# Patient Record
Sex: Male | Born: 1950
Health system: Southern US, Community
[De-identification: ages and names within clinical notes are randomized; demographics above are authoritative.]

## PROBLEM LIST (undated history)

## (undated) DIAGNOSIS — M1990 Unspecified osteoarthritis, unspecified site: Secondary | ICD-10-CM

## (undated) DIAGNOSIS — T7840XA Allergy, unspecified, initial encounter: Secondary | ICD-10-CM

## (undated) HISTORY — DX: Unspecified osteoarthritis, unspecified site: M19.90

## (undated) HISTORY — DX: Allergy, unspecified, initial encounter: T78.40XA

---

## 2005-05-12 ENCOUNTER — Ambulatory Visit: Payer: Self-pay | Admitting: Internal Medicine

## 2005-09-26 ENCOUNTER — Ambulatory Visit: Payer: Self-pay | Admitting: Internal Medicine

## 2007-07-30 ENCOUNTER — Encounter: Payer: Self-pay | Admitting: Internal Medicine

## 2012-05-07 ENCOUNTER — Ambulatory Visit (INDEPENDENT_AMBULATORY_CARE_PROVIDER_SITE_OTHER): Payer: BC Managed Care – PPO | Admitting: Family Medicine

## 2012-05-07 ENCOUNTER — Encounter: Payer: Self-pay | Admitting: Family Medicine

## 2012-05-07 VITALS — BP 119/79 | HR 68 | Temp 98.7°F | Resp 17 | Ht 66.0 in | Wt 158.4 lb

## 2012-05-07 DIAGNOSIS — R350 Frequency of micturition: Secondary | ICD-10-CM

## 2012-05-07 DIAGNOSIS — R351 Nocturia: Secondary | ICD-10-CM

## 2012-05-07 LAB — POCT URINALYSIS DIPSTICK
Bilirubin, UA: NEGATIVE
Blood, UA: NEGATIVE
Glucose, UA: NEGATIVE
Ketones, UA: NEGATIVE
Nitrite, UA: NEGATIVE
Spec Grav, UA: >=1.03
pH, UA: 5.5

## 2012-05-07 NOTE — Patient Instructions (Signed)
Your should receive a call or letter about your lab results within the next week to 10 days.  If any recurrence of your symptoms - return for recheck.  schedule a physical in the next few months.  Return to the clinic or go to the nearest emergency room if any of your symptoms worsen or new symptoms occur.

## 2012-05-07 NOTE — Progress Notes (Signed)
  Subjective:    Patient ID: Rodney Bailey, male    DOB: 31-Jul-1950, 61 y.o.   MRN: [id number]  HPI 3-4 weeks ago - increase in urinary frequency and nocturia.  Cut back on tea and stopped caffeine altogether.  improved - feels back to normal now - past 4-5 days.  Last PSA  1.35 04/06/10.  Review of Systems  Constitutional: Negative for fever, chills and unexpected weight change.  Gastrointestinal:       No tenesmus.  Genitourinary: Negative for hematuria and testicular pain.       Initial odor and burning - none now.   Musculoskeletal: Negative for back pain.  Skin: Negative for rash.       Objective:   Physical Exam  Vitals reviewed. Constitutional: He is oriented to person, place, and time. He appears well-developed and well-nourished.  Pulmonary/Chest: Effort normal.  Abdominal: Soft. Bowel sounds are normal. There is no CVA tenderness.  Genitourinary: Prostate normal. Prostate is not tender.  Neurological: He is alert and oriented to person, place, and time.  Skin: Skin is warm and dry.  Psychiatric: He has a normal mood and affect.      Results for orders placed in visit on 05/07/12  POCT URINALYSIS DIPSTICK      Component Value Range   Color, UA yellow     Clarity, UA clear     Glucose, UA neg     Bilirubin, UA neg     Ketones, UA neg     Spec Grav, UA >=1.030     Blood, UA neg     pH, UA 5.5     Protein, UA neg     Urobilinogen, UA 0.2     Nitrite, UA neg     Leukocytes, UA Negative         Assessment & Plan:  Rodney Bailey is a 61 y.o. male 1. Urinary frequency  POCT urinalysis dipstick, PSA  2. Nocturia  PSA   Dysuria/frequency now resolved.  Ddx mild prostatitis/UTI or caffeine effect.  U/a reassuring and with sx's resolved, will hold on further workup for now.  Will check PSA and plan on cpe next 3 months. rtc if sx's recur sooner.   Patient Instructions  Your should receive a call or letter about your lab results within the next week to 10 days.    If any recurrence of your symptoms - return for recheck.  schedule a physical in the next few months.  Return to the clinic or go to the nearest emergency room if any of your symptoms worsen or new symptoms occur.

## 2012-09-11 ENCOUNTER — Ambulatory Visit (INDEPENDENT_AMBULATORY_CARE_PROVIDER_SITE_OTHER): Payer: BC Managed Care – PPO | Admitting: Physician Assistant

## 2012-09-11 VITALS — BP 138/72 | HR 92 | Temp 100.9°F | Resp 16 | Ht 66.0 in | Wt 164.0 lb

## 2012-09-11 DIAGNOSIS — R509 Fever, unspecified: Secondary | ICD-10-CM

## 2012-09-11 DIAGNOSIS — R059 Cough, unspecified: Secondary | ICD-10-CM

## 2012-09-11 DIAGNOSIS — R05 Cough: Secondary | ICD-10-CM

## 2012-09-11 DIAGNOSIS — J019 Acute sinusitis, unspecified: Secondary | ICD-10-CM

## 2012-09-11 LAB — POCT CBC
Granulocyte percent: 71.7 %G (ref 37–80)
HCT, POC: 49.7 % (ref 43.5–53.7)
Lymph, poc: 0.9 (ref 0.6–3.4)
MCH, POC: 30.7 pg (ref 27–31.2)
MCV: 95.9 fL (ref 80–97)
POC LYMPH PERCENT: 19.4 %L (ref 10–50)
RDW, POC: 13.2 %
WBC: 4.4 10*3/uL — AB (ref 4.6–10.2)

## 2012-09-11 LAB — POCT INFLUENZA A/B
Influenza A, POC: NEGATIVE
Influenza B, POC: NEGATIVE

## 2012-09-11 MED ORDER — AZELASTINE HCL 0.1 % NA SOLN: 1.0000 | Freq: Two times a day (BID) | NASAL | Status: DC

## 2012-09-11 MED ORDER — MOMETASONE FUROATE 50 MCG/ACT NA SUSP: 2.0000 | Freq: Every day | NASAL | Status: DC

## 2012-09-11 MED ORDER — LEVOFLOXACIN 500 MG PO TABS: 500.0000 mg | ORAL_TABLET | Freq: Every day | ORAL | Status: DC

## 2012-09-11 MED ORDER — HYDROCODONE-HOMATROPINE 5-1.5 MG/5ML PO SYRP: ORAL_SOLUTION | ORAL | Status: DC

## 2012-09-11 NOTE — Progress Notes (Signed)
Patient ID: Rodney Bailey MRN: [id number], DOB: 18-May-1951, 62 y.o. Date of Encounter: 09/11/2012, 2:13 PM  Primary Physician: No primary provider on file.  Chief Complaint:  Chief Complaint  Patient presents with  . URI    3 days  . Fever    HPI: 62 y.o. male presents with a three day history of nasal congestion, post nasal drip, sore throat, sinus pressure, and cough. T max 101 earlier today. Nasal congestion thick and green/yellow. Sinus pressure is the worst symptom. Cough is not productive and seems to be worse at nighttime when he lays down. Ears feel slightly full, leading to a "dull" sensation. Has tried Mucinex without success. No GI complaints. Appetite normal. No recent antibiotics, recent travels, or sick contacts. No leg trauma, sedentary periods, h/o cancer, or tobacco use. No influenza vaccine.   Past Medical History  Diagnosis Date  . Allergy   . Arthritis      Home Meds: Prior to Admission medications   Medication Sig Start Date End Date Taking? Authorizing Provider  CALCIUM PO Take by mouth daily.   Yes Historical Provider, MD  GARLIC PO Take by mouth daily.   Yes Historical Provider, MD  GLUCOSAMINE PO Take by mouth daily.   Yes Historical Provider, MD  Melatonin 2.5 MG CAPS Take by mouth at bedtime.   Yes Historical Provider, MD  Multiple Vitamin (MULTIVITAMIN) tablet Take 1 tablet by mouth daily.   Yes Historical Provider, MD  Multiple Vitamins-Minerals (ZINC PO) Take by mouth daily.   Yes Historical Provider, MD  naproxen (NAPROSYN) 250 MG tablet Take 250 mg by mouth as needed.   Yes Historical Provider, MD  Omega-3 Fatty Acids (OMEGA 3 PO) Take by mouth daily.   Yes Historical Provider, MD  saw palmetto 160 MG capsule Take 160 mg by mouth 2 (two) times daily.   Yes Historical Provider, MD  TURMERIC PO Take by mouth daily.   Yes Historical Provider, MD  mometasone (NASONEX) 50 MCG/ACT nasal spray Place 2 sprays into the nose daily as needed.    Historical  Provider, MD    Allergies: No Known Allergies  History   Social History  . Marital Status: Married    Spouse Name: [name]    Number of Children: [name]  . Years of Education: [name]   Occupational History  . Not on file.   Social History Main Topics  . Smoking status: Never Smoker   . Smokeless tobacco: Not on file  . Alcohol Use: No  . Drug Use: No  . Sexually Active: Yes    Birth Control/ Protection: None     Comment: SEX PARTNERS IN THE LAST 12 MONTHS 1 AND CURRENT BIRTH CONTROL - NOT NEEDED   Other Topics Concern  . Not on file   Social History Narrative   EXERCISE WALKING AND WEIGHT TRAINING 5-6 DAYS/WEEK FOR 30 MINUTES     Review of Systems: Constitutional: Positive for fever, chills, and fatigue. Negative for night sweats or weight changes HEENT: see above Cardiovascular: negative for chest pain or palpitations Respiratory: Positive for cough. Negative for hemoptysis, wheezing, or shortness of breath Abdominal: negative for abdominal pain, nausea, vomiting or diarrhea Dermatological: negative for rash Neurologic: Positive for headache. Negative for dizziness or vertigo   Physical Exam: Blood pressure 138/72, pulse 92, temperature 100.9 F (38.3 C), temperature source Oral, resp. rate 16, height 5\' 6"  (1.676 m), weight 164 lb (74.39 kg), SpO2 97.00%., Body mass index is 26.48 kg/(m^2). General: Well  developed, well nourished, in no acute distress. Head: Normocephalic, atraumatic, eyes without discharge, sclera non-icteric, nares are congested. Bilateral auditory canals clear, TM's are without perforation, pearly grey with reflective cone of light bilaterally. Serous effusion bilaterally behind TM's. Maxillary sinus TTP. Oral cavity moist, dentition normal. Posterior pharynx with post nasal drip and mild erythema. No peritonsillar abscess or tonsillar exudate. Uvula midline.  Neck: Supple. No thyromegaly. Full ROM. No lymphadenopathy. Lungs: Clear bilaterally to  auscultation without wheezes, rales, or rhonchi. Breathing is unlabored.  Heart: RRR with S1 S2. No murmurs, rubs, or gallops appreciated. Msk:  Strength and tone normal for age. Extremities: No clubbing or cyanosis. No edema. Neuro: Alert and oriented X 3. Moves all extremities spontaneously. CNII-XII grossly in tact. Psych:  Responds to questions appropriately with a normal affect.   Labs: Results for orders placed in visit on 09/11/12  POCT INFLUENZA A/B      Result Value Range   Influenza A, POC Negative     Influenza B, POC Negative    POCT CBC      Result Value Range   WBC 4.4 (*) 4.6 - 10.2 K/uL   Lymph, poc 0.9  0.6 - 3.4   POC LYMPH PERCENT 19.4  10 - 50 %L   MID (cbc) 0.4  0 - 0.9   POC MID % 8.9  0 - 12 %M   POC Granulocyte 3.2  2 - 6.9   Granulocyte percent 71.7  37 - 80 %G   RBC 5.18  4.69 - 6.13 M/uL   Hemoglobin 15.9  14.1 - 18.1 g/dL   HCT, POC 16.1  09.6 - 53.7 %   MCV 95.9  80 - 97 fL   MCH, POC 30.7  27 - 31.2 pg   MCHC 32.0  31.8 - 35.4 g/dL   RDW, POC 04.5     Platelet Count, POC 243  142 - 424 K/uL   MPV 8.2  0 - 99.8 fL     ASSESSMENT AND PLAN:  62 y.o. male with sinusitis, cough, and fever -Levaquin 500 mg 1 po daily #10 no RF -Hycodan #4oz 1 tsp po q 4-6 hours prn cough no RF SED -Nasonex 2 sprays daily #1 RF 6 -Astelin 1 spray bid #1 RF 6 -Mucinex -Tylenol/Motrin prn -Rest/fluids -Negative influenza and outside of Tamiflu treatment window -RTC precautions -RTC 3-5 days if no improvement  Signed, Eula Listen, PA-C 09/11/2012 2:13 PM

## 2013-03-19 ENCOUNTER — Ambulatory Visit (INDEPENDENT_AMBULATORY_CARE_PROVIDER_SITE_OTHER): Payer: BC Managed Care – PPO | Admitting: Family Medicine

## 2013-03-19 VITALS — BP 122/70 | HR 86 | Temp 98.9°F | Resp 16 | Ht 66.0 in | Wt 165.2 lb

## 2013-03-19 DIAGNOSIS — Z23 Encounter for immunization: Secondary | ICD-10-CM

## 2013-03-19 DIAGNOSIS — S61209A Unspecified open wound of unspecified finger without damage to nail, initial encounter: Secondary | ICD-10-CM

## 2013-03-19 NOTE — Patient Instructions (Signed)
You had a Tdap today- your next tetanus shot will just need to be a Td.   If you have any problems please let me know

## 2013-03-19 NOTE — Progress Notes (Signed)
[facility] [address], [place] [address] [phone]  Date:  03/19/2013   Name:  Rodney Bailey   DOB:  [birth year]   MRN:  [id number]  PCP:  No primary provider on file.    Chief Complaint: Finger Laceration   History of Present Illness:  Rodney Bailey is a 62 y.o. very pleasant male patient who presents with the following:  Small wound to left index finger that occurred yesterday while installing an exhaust fan.  He thinks his last tetanus was in 2001- however he is not sure and we do not have documentation.  He is not sure if he had a Td or Tdap last.  otherwise he is well and does not have any concerns today.  He declines a flu shot Generally healthy   There are no active problems to display for this patient.   Past Medical History  Diagnosis Date  . Allergy   . Arthritis     History reviewed. No pertinent past surgical history.  History  Substance Use Topics  . Smoking status: Never Smoker   . Smokeless tobacco: Not on file  . Alcohol Use: No    Family History  Problem Relation Age of Onset  . Heart disease Mother     CHF  . Stroke Father     No Known Allergies  Medication list has been reviewed and updated.  Current Outpatient Prescriptions on File Prior to Visit  Medication Sig Dispense Refill  . azelastine (ASTELIN) 137 MCG/SPRAY nasal spray Place 1 spray into the nose 2 (two) times daily. Use in each nostril as directed  30 mL  6  . CALCIUM PO Take by mouth daily.      Marland Kitchen GARLIC PO Take by mouth daily.      Marland Kitchen GLUCOSAMINE PO Take by mouth daily.      . Melatonin 2.5 MG CAPS Take by mouth at bedtime.      . mometasone (NASONEX) 50 MCG/ACT nasal spray Place 2 sprays into the nose daily.  17 g  6  . Multiple Vitamin (MULTIVITAMIN) tablet Take 1 tablet by mouth daily.      . Multiple Vitamins-Minerals (ZINC PO) Take by mouth daily.      . naproxen (NAPROSYN) 250 MG tablet Take 250 mg by mouth as needed.      . Omega-3 Fatty  Acids (OMEGA 3 PO) Take by mouth daily.      . saw palmetto 160 MG capsule Take 160 mg by mouth 2 (two) times daily.      . TURMERIC PO Take by mouth daily.      Marland Kitchen HYDROcodone-homatropine (HYCODAN) 5-1.5 MG/5ML syrup 1 TSP PO Q 4-6 HOURS PRN COUGH  120 mL  0  . levofloxacin (LEVAQUIN) 500 MG tablet Take 1 tablet (500 mg total) by mouth daily.  10 tablet  0   No current facility-administered medications on file prior to visit.    Review of Systems:  As per HPI- otherwise negative.   Physical Examination: Filed Vitals:   03/19/13 1458  BP: 122/70  Pulse: 86  Temp: 98.9 F (37.2 C)  Resp: 16   Filed Vitals:   03/19/13 1458  Height: 5\' 6"  (1.676 m)  Weight: 165 lb 3.2 oz (74.934 kg)   Body mass index is 26.68 kg/(m^2). Ideal Body Weight: Weight in (lb) to have BMI = 25: 154.6  GEN: WDWN, NAD, Non-toxic, A & O x 3, looks well HEENT: Atraumatic,  Normocephalic. Neck supple. No masses, No LAD. Ears and Nose: No external deformity. CV: RRR, No M/G/R. No JVD. No thrill. No extra heart sounds. PULM: CTA B, no wheezes, crackles, rhonchi. No retractions. No resp. distress. No accessory muscle use. EXTR: No c/c/e NEURO Normal gait.  PSYCH: Normally interactive. Conversant. Not depressed or anxious appearing.  Calm demeanor.  Small, clean, shallow lac to the pad of the left index finger, about 1/2 cm in length. does not pull apart as it is quite shallow.     Assessment and Plan: Wound, open, finger, initial encounter - Plan: Tdap vaccine greater than or equal to 7yo IM  Small laceration.  Update tdap today.  Follow-up prn.    Signed Abbe Amsterdam, MD

## 2014-03-11 ENCOUNTER — Ambulatory Visit (INDEPENDENT_AMBULATORY_CARE_PROVIDER_SITE_OTHER): Payer: BC Managed Care – PPO | Admitting: Family Medicine

## 2014-03-11 VITALS — BP 110/72 | HR 72 | Temp 98.3°F | Resp 16 | Ht 67.0 in | Wt 171.0 lb

## 2014-03-11 DIAGNOSIS — R319 Hematuria, unspecified: Secondary | ICD-10-CM

## 2014-03-11 DIAGNOSIS — R300 Dysuria: Secondary | ICD-10-CM

## 2014-03-11 DIAGNOSIS — N411 Chronic prostatitis: Secondary | ICD-10-CM

## 2014-03-11 LAB — POCT UA - MICROSCOPIC ONLY
Bacteria, U Microscopic: NEGATIVE
Casts, Ur, LPF, POC: NEGATIVE
Crystals, Ur, HPF, POC: NEGATIVE
MUCUS UA: NEGATIVE
YEAST UA: NEGATIVE

## 2014-03-11 LAB — POCT URINALYSIS DIPSTICK
BILIRUBIN UA: NEGATIVE
GLUCOSE UA: NEGATIVE
Ketones, UA: NEGATIVE
LEUKOCYTES UA: NEGATIVE
NITRITE UA: NEGATIVE
Protein, UA: NEGATIVE
Spec Grav, UA: 1.01
UROBILINOGEN UA: 0.2
pH, UA: 5

## 2014-03-11 MED ORDER — CIPROFLOXACIN HCL 500 MG PO TABS: 500.0000 mg | ORAL_TABLET | Freq: Two times a day (BID) | ORAL | Status: DC

## 2014-03-11 NOTE — Progress Notes (Signed)
Subjective:    Patient ID: Rodney Bailey, male    DOB: 26-Jan-1951, 63 y.o.   MRN: [id number]  This chart was scribed for Wendie Agreste, MD at [facility] by Rayfield Citizen, medical scribe. This patient was seen in room Room 4 and the patient's care was started at 3:48 PM.   HPI  HPI Comments: Rodney Bailey is a 63 y.o. male who [facility] complaining of urinary symptoms.   Patient last seen by Dr. Carlota Raspberry in 2013. Noted urinary frequency at that time that had been occurring for 3-4 weeks but improved by that visit. PSA at that time was 2.14. Recommended recheck in 6 months for repeat PSA.   Patient currently believes he has a UTI or bladder infection. He explains his symptoms began 2-3 months ago, at which time he took cranberry tablets with minimal relief. He has burning, itching, and odorous urine. Patient reports that he drinks a large amount of fluids so he cannot comment on increased frequency. He wakes up 2-3x a night to urinate; this is normal for him for the past year or so, but has gotten better recently since he has cut back on liquid intake. He denies discharge or blood in the urine. He denies fevers, nausea, vomiting, back pain, or weight loss.   Patient reports no new sexual partners; he is married with no extramarital contacts.   There are no active problems to display for this patient.  Past Medical History  Diagnosis Date  . Allergy   . Arthritis    History reviewed. No pertinent past surgical history. No Known Allergies Prior to Admission medications   Medication Sig Start Date End Date Taking? Authorizing Provider  azelastine (ASTELIN) 137 MCG/SPRAY nasal spray Place 1 spray into the nose 2 (two) times daily. Use in each nostril as directed 09/11/12  Yes [name], PA-C  CALCIUM PO Take by mouth daily.   Yes Historical Provider, MD  GARLIC PO Take by mouth daily.   Yes Historical Provider, MD  GLUCOSAMINE PO  Take by mouth daily.   Yes Historical Provider, MD  Melatonin 2.5 MG CAPS Take by mouth at bedtime.   Yes Historical Provider, MD  mometasone (NASONEX) 50 MCG/ACT nasal spray Place 2 sprays into the nose daily. 09/11/12  Yes [name], PA-C  Multiple Vitamin (MULTIVITAMIN) tablet Take 1 tablet by mouth daily.   Yes Historical Provider, MD  Multiple Vitamins-Minerals (ZINC PO) Take by mouth daily.   Yes Historical Provider, MD  naproxen (NAPROSYN) 250 MG tablet Take 250 mg by mouth as needed.   Yes Historical Provider, MD  Omega-3 Fatty Acids (OMEGA 3 PO) Take by mouth daily.   Yes Historical Provider, MD  saw palmetto 160 MG capsule Take 160 mg by mouth 2 (two) times daily.   Yes Historical Provider, MD  TURMERIC PO Take by mouth daily.   Yes Historical Provider, MD   History   Social History  . Marital Status: Married    Spouse Name: [name]    Number of Children: [name]  . Years of Education: [name]   Occupational History  . Not on file.   Social History Main Topics  . Smoking status: Never Smoker   . Smokeless tobacco: Not on file  . Alcohol Use: No  . Drug Use: No  . Sexual Activity: Yes    Birth Control/ Protection: None     Comment: SEX PARTNERS  IN THE LAST 12 MONTHS 1 AND CURRENT BIRTH CONTROL - NOT NEEDED   Other Topics Concern  . Not on file   Social History Narrative   EXERCISE WALKING AND WEIGHT TRAINING 5-6 DAYS/WEEK FOR 30 MINUTES   Review of Systems  Constitutional: Negative for fever and unexpected weight change.  Gastrointestinal: Negative for nausea and vomiting.  Genitourinary: Positive for frequency. Negative for hematuria and discharge.      Objective:   Physical Exam  Nursing note and vitals reviewed. Constitutional: He is oriented to person, place, and time. He appears well-developed and well-nourished.  HENT:  Head: Normocephalic and atraumatic.  Neck: No tracheal deviation present.  Cardiovascular: Normal rate, regular rhythm and normal heart sounds.   Exam reveals no gallop and no friction rub.   No murmur heard. Pulmonary/Chest: Effort normal and breath sounds normal. He has no wheezes. He has no rales.  No rhonchi   Abdominal: Soft. There is no tenderness.  No CVA tenderness  Genitourinary:  Minimal discomfort with palpation of prostate but not painful; no apparent nodules or marked enlargement. Testicles were nontender. Penis nontender, no discharge, no external rash.   Neurological: He is alert and oriented to person, place, and time.  Skin: Skin is warm and dry.  Psychiatric: He has a normal mood and affect. His behavior is normal.    Filed Vitals:   03/11/14 1405  BP: 110/72  Pulse: 72  Temp: 98.3 F (36.8 C)  Resp: 16  Height: 5\' 7"  (1.702 m)  Weight: 171 lb (77.565 kg)  SpO2: 96%      Assessment & Plan:   Rodney Bailey is a 63 y.o. male Dysuria - Plan: POCT UA - Microscopic Only, POCT urinalysis dipstick, PSA, Urine culture, ciprofloxacin (CIPRO) 500 MG tablet  Chronic prostatitis with hematuria - Plan: ciprofloxacin (CIPRO) 500 MG tablet Suspected chronic prostatitis. Start with 10 days cipro, PSA and urine cx and possible urology eval. rtc precautions. SED and tendon risks of Cipro discussed.   Meds ordered this encounter  Medications  . ciprofloxacin (CIPRO) 500 MG tablet    Sig: Take 1 tablet (500 mg total) by mouth 2 (two) times daily.    Dispense:  20 tablet    Refill:  0   Patient Instructions  You should receive a call or letter about your lab results within the next week to 10 days.   We can start treatment for possible chronic prostate infection - may need to extend antibiotic longer than 10 days. But will wait on results of prostate tests first. Recheck in 10 days. Return to the clinic or go to the nearest emergency room if any of your symptoms worsen or new symptoms occur. Prostatitis The prostate gland is about the size and shape of a walnut. It is located just below your bladder. It produces one  of the components of semen, which is made up of sperm and the fluids that help nourish and transport it out from the testicles. Prostatitis is inflammation of the prostate gland.  There are four types of prostatitis:  Acute bacterial prostatitis. This is the least common type of prostatitis. It starts quickly and usually is associated with a bladder infection, high fever, and shaking chills. It can occur at any age.  Chronic bacterial prostatitis. This is a persistent bacterial infection in the prostate. It usually develops from repeated acute bacterial prostatitis or acute bacterial prostatitis that was not properly treated. It can occur in men of any age but  is most common in middle-aged men whose prostate has begun to enlarge. The symptoms are not as severe as those in acute bacterial prostatitis. Discomfort in the part of your body that is in front of your rectum and below your scrotum (perineum), lower abdomen, or in the head of your penis (glans) may represent your primary discomfort.  Chronic prostatitis (nonbacterial). This is the most common type of prostatitis. It is inflammation of the prostate gland that is not caused by a bacterial infection. The cause is unknown and may be associated with a viral infection or autoimmune disorder.  Prostatodynia (pelvic floor disorder). This is associated with increased muscular tone in the pelvis surrounding the prostate. CAUSES The causes of bacterial prostatitis are bacterial infection. The causes of the other types of prostatitis are unknown.  SYMPTOMS  Symptoms can vary depending upon the type of prostatitis that exists. There can also be overlap in symptoms. Possible symptoms for each type of prostatitis are listed below. Acute Bacterial Prostatitis  Painful urination.  Fever or chills.  Muscle or joint pains.  Low back pain.  Low abdominal pain.  Inability to empty bladder completely. Chronic Bacterial Prostatitis, Chronic  Nonbacterial Prostatitis, and Prostatodynia  Sudden urge to urinate.  Frequent urination.  Difficulty starting urine stream.  Weak urine stream.  Discharge from the urethra.  Dribbling after urination.  Rectal pain.  Pain in the testicles, penis, or tip of the penis.  Pain in the perineum.  Problems with sexual function.  Painful ejaculation.  Bloody semen. DIAGNOSIS  In order to diagnose prostatitis, your health care provider will ask about your symptoms. One or more urine samples will be taken and tested (urinalysis). If the urinalysis result is negative for bacteria, your health care provider may use a finger to feel your prostate (digital rectal exam). This exam helps your health care provider determine if your prostate is swollen and tender. It will also produce a specimen of semen that can be analyzed. TREATMENT  Treatment for prostatitis depends on the cause. If a bacterial infection is the cause, it can be treated with antibiotic medicine. In cases of chronic bacterial prostatitis, the use of antibiotics for up to 1 month or 6 weeks may be necessary. Your health care provider may instruct you to take sitz baths to help relieve pain. A sitz bath is a bath of hot water in which your hips and buttocks are under water. This relaxes the pelvic floor muscles and often helps to relieve the pressure on your prostate. HOME CARE INSTRUCTIONS   Take all medicines as directed by your health care provider.  Take sitz baths as directed by your health care provider. SEEK MEDICAL CARE IF:   Your symptoms get worse, not better.  You have a fever. SEEK IMMEDIATE MEDICAL CARE IF:   You have chills.  You feel nauseous or vomit.  You feel lightheaded or faint.  You are unable to urinate.  You have blood or blood clots in your urine. MAKE SURE YOU:  Understand these instructions.  Will watch your condition.  Will get help right away if you are not doing well or get  worse. Document Released: 05/20/2000 Document Revised: 05/28/2013 Document Reviewed: 12/10/2012 [facility] Patient Information 2015 [place], [place]. This information is not intended to replace advice given to you by your health care provider. Make sure you discuss any questions you have with your health care provider.     I personally performed the services described in this documentation, which was  scribed in my presence. The recorded information has been reviewed and considered, and addended by me as needed.

## 2014-03-11 NOTE — Patient Instructions (Addendum)
You should receive a call or letter about your lab results within the next week to 10 days.   We can start treatment for possible chronic prostate infection - may need to extend antibiotic longer than 10 days. But will wait on results of prostate tests first. Recheck in 10 days. Return to the clinic or go to the nearest emergency room if any of your symptoms worsen or new symptoms occur. Prostatitis The prostate gland is about the size and shape of a walnut. It is located just below your bladder. It produces one of the components of semen, which is made up of sperm and the fluids that help nourish and transport it out from the testicles. Prostatitis is inflammation of the prostate gland.  There are four types of prostatitis:  Acute bacterial prostatitis. This is the least common type of prostatitis. It starts quickly and usually is associated with a bladder infection, high fever, and shaking chills. It can occur at any age.  Chronic bacterial prostatitis. This is a persistent bacterial infection in the prostate. It usually develops from repeated acute bacterial prostatitis or acute bacterial prostatitis that was not properly treated. It can occur in men of any age but is most common in middle-aged men whose prostate has begun to enlarge. The symptoms are not as severe as those in acute bacterial prostatitis. Discomfort in the part of your body that is in front of your rectum and below your scrotum (perineum), lower abdomen, or in the head of your penis (glans) may represent your primary discomfort.  Chronic prostatitis (nonbacterial). This is the most common type of prostatitis. It is inflammation of the prostate gland that is not caused by a bacterial infection. The cause is unknown and may be associated with a viral infection or autoimmune disorder.  Prostatodynia (pelvic floor disorder). This is associated with increased muscular tone in the pelvis surrounding the prostate. CAUSES The causes of  bacterial prostatitis are bacterial infection. The causes of the other types of prostatitis are unknown.  SYMPTOMS  Symptoms can vary depending upon the type of prostatitis that exists. There can also be overlap in symptoms. Possible symptoms for each type of prostatitis are listed below. Acute Bacterial Prostatitis  Painful urination.  Fever or chills.  Muscle or joint pains.  Low back pain.  Low abdominal pain.  Inability to empty bladder completely. Chronic Bacterial Prostatitis, Chronic Nonbacterial Prostatitis, and Prostatodynia  Sudden urge to urinate.  Frequent urination.  Difficulty starting urine stream.  Weak urine stream.  Discharge from the urethra.  Dribbling after urination.  Rectal pain.  Pain in the testicles, penis, or tip of the penis.  Pain in the perineum.  Problems with sexual function.  Painful ejaculation.  Bloody semen. DIAGNOSIS  In order to diagnose prostatitis, your health care provider will ask about your symptoms. One or more urine samples will be taken and tested (urinalysis). If the urinalysis result is negative for bacteria, your health care provider may use a finger to feel your prostate (digital rectal exam). This exam helps your health care provider determine if your prostate is swollen and tender. It will also produce a specimen of semen that can be analyzed. TREATMENT  Treatment for prostatitis depends on the cause. If a bacterial infection is the cause, it can be treated with antibiotic medicine. In cases of chronic bacterial prostatitis, the use of antibiotics for up to 1 month or 6 weeks may be necessary. Your health care provider may instruct you to take sitz  baths to help relieve pain. A sitz bath is a bath of hot water in which your hips and buttocks are under water. This relaxes the pelvic floor muscles and often helps to relieve the pressure on your prostate. HOME CARE INSTRUCTIONS   Take all medicines as directed by your  health care provider.  Take sitz baths as directed by your health care provider. SEEK MEDICAL CARE IF:   Your symptoms get worse, not better.  You have a fever. SEEK IMMEDIATE MEDICAL CARE IF:   You have chills.  You feel nauseous or vomit.  You feel lightheaded or faint.  You are unable to urinate.  You have blood or blood clots in your urine. MAKE SURE YOU:  Understand these instructions.  Will watch your condition.  Will get help right away if you are not doing well or get worse. Document Released: 05/20/2000 Document Revised: 05/28/2013 Document Reviewed: 12/10/2012 [facility] Patient Information 2015 [place], [place]. This information is not intended to replace advice given to you by your health care provider. Make sure you discuss any questions you have with your health care provider.

## 2014-03-12 LAB — PSA: PSA: 1.47 ng/mL (ref <=4.00)

## 2014-03-12 LAB — URINE CULTURE
Colony Count: NO GROWTH
Organism ID, Bacteria: NO GROWTH

## 2014-03-17 ENCOUNTER — Telehealth: Payer: Self-pay

## 2014-03-17 NOTE — Telephone Encounter (Signed)
Pt would like to know the results of his resent PSA test Best# [phone]

## 2014-04-23 ENCOUNTER — Telehealth: Payer: Self-pay

## 2014-04-23 DIAGNOSIS — R350 Frequency of micturition: Secondary | ICD-10-CM

## 2014-04-23 DIAGNOSIS — R351 Nocturia: Secondary | ICD-10-CM

## 2014-04-23 NOTE — Telephone Encounter (Signed)
Pended referral for urologist. Please advise.

## 2014-04-23 NOTE — Telephone Encounter (Signed)
Patient is now requesting from Dr. Carlota Raspberry which urologist he would recommend patient would like a referral now for urology. Please advise. Thank you   Best: [phone]

## 2014-04-24 ENCOUNTER — Telehealth: Payer: Self-pay

## 2014-04-26 ENCOUNTER — Ambulatory Visit (INDEPENDENT_AMBULATORY_CARE_PROVIDER_SITE_OTHER): Payer: BC Managed Care – PPO | Admitting: Emergency Medicine

## 2014-04-26 VITALS — BP 122/78 | HR 90 | Temp 97.8°F | Resp 18 | Ht 66.0 in | Wt 164.0 lb

## 2014-04-26 DIAGNOSIS — R319 Hematuria, unspecified: Secondary | ICD-10-CM

## 2014-04-26 DIAGNOSIS — R300 Dysuria: Secondary | ICD-10-CM

## 2014-04-26 DIAGNOSIS — R350 Frequency of micturition: Secondary | ICD-10-CM

## 2014-04-26 LAB — POCT URINALYSIS DIPSTICK
Bilirubin, UA: NEGATIVE
Glucose, UA: NEGATIVE
KETONES UA: NEGATIVE
Leukocytes, UA: NEGATIVE
Nitrite, UA: NEGATIVE
PH UA: 5
PROTEIN UA: NEGATIVE
SPEC GRAV UA: 1.025
Urobilinogen, UA: 0.2

## 2014-04-26 LAB — POCT UA - MICROSCOPIC ONLY
Casts, Ur, LPF, POC: NEGATIVE
Crystals, Ur, HPF, POC: NEGATIVE
MUCUS UA: NEGATIVE
Yeast, UA: NEGATIVE

## 2014-04-26 MED ORDER — TAMSULOSIN HCL 0.4 MG PO CAPS: 0.4000 mg | ORAL_CAPSULE | Freq: Every day | ORAL | Status: DC

## 2014-04-26 NOTE — Progress Notes (Signed)
Subjective:    Patient ID: Rodney Bailey, male    DOB: May 23, 1951, 63 y.o.   MRN: [id number] This chart was scribed for [name], MD  by Cathie Hoops, ED Scribe. The patient was seen in Room 1. The patient's care was started at 8:28 AM.   04/26/2014  Chief Complaint  Patient presents with  . Follow-up    UTI    HPI HPI Comments: Rodney Bailey is a 63 y.o. male who presents to the [facility] here for UTI follow-up. Pt complains of associated recurrent dysuria, strong urine smell, pressure during urination, and waking 2-3x/night to urinate. Pt was seen by Dr. Merri Ray on 03/11/2014 for prostatitis. Pt's PSA was checked at that time and found to be within normal limits at 2.14. Pt was prescribed Cipro 500 mg bid for 10 days with minimal relief. He was referred to [facility] for unresolved symptoms but cannot be seen until January 1. He denies ever being on medication to assist with urination. He takes Astelin and Nasonex as needed but denies taking any chronic medications. Pt denies being a smoker. Pt denies blood during intercourse. Pt denies hematuria.   Review of Systems  Constitutional: Negative for fever and chills.  Gastrointestinal: Negative for nausea and vomiting.  Genitourinary: Positive for dysuria. Negative for hematuria.    Past Medical History  Diagnosis Date  . Allergy   . Arthritis    No past surgical history on file. No Known Allergies Current Outpatient Prescriptions  Medication Sig Dispense Refill  . azelastine (ASTELIN) 137 MCG/SPRAY nasal spray Place 1 spray into the nose 2 (two) times daily. Use in each nostril as directed 30 mL 6  . CALCIUM PO Take by mouth daily.    . ciprofloxacin (CIPRO) 500 MG tablet Take 1 tablet (500 mg total) by mouth 2 (two) times daily. 20 tablet 0  . GARLIC PO Take by mouth daily.    Marland Kitchen GLUCOSAMINE PO Take by mouth daily.    . Melatonin 2.5 MG CAPS Take by mouth at bedtime.    . mometasone  (NASONEX) 50 MCG/ACT nasal spray Place 2 sprays into the nose daily. 17 g 6  . Multiple Vitamin (MULTIVITAMIN) tablet Take 1 tablet by mouth daily.    . Multiple Vitamins-Minerals (ZINC PO) Take by mouth daily.    . naproxen (NAPROSYN) 250 MG tablet Take 250 mg by mouth as needed.    . Omega-3 Fatty Acids (OMEGA 3 PO) Take by mouth daily.    . saw palmetto 160 MG capsule Take 160 mg by mouth 2 (two) times daily.    . TURMERIC PO Take by mouth daily.     No current facility-administered medications for this visit.       Objective:  Triage Vitals: BP 154/78 mmHg  Pulse 90  Temp(Src) 97.8 F (36.6 C)  Resp 18  Ht 5\' 6"  (1.676 m)  Wt 164 lb (74.39 kg)  BMI 26.48 kg/m2  SpO2 97% Physical Exam  Constitutional: He is oriented to person, place, and time. He appears well-developed and well-nourished. No distress.  HENT:  Head: Normocephalic and atraumatic.  Eyes: Conjunctivae and EOM are normal.  Neck: Neck supple. No tracheal deviation present.  Cardiovascular: Normal rate.   Pulmonary/Chest: Effort normal. No respiratory distress.  Abdominal: Soft.  No flank tenderness.  Genitourinary:  Genital are normal without any discharge or lesions.  Musculoskeletal: Normal range of motion.  Neurological: He is alert and oriented to  person, place, and time.  Skin: Skin is warm and dry.  Psychiatric: He has a normal mood and affect. His behavior is normal.  Nursing note and vitals reviewed.  Results for orders placed or performed in visit on 04/26/14  POCT UA - Microscopic Only  Result Value Ref Range   WBC, Ur, HPF, POC 0-2    RBC, urine, microscopic 2-3    Bacteria, U Microscopic trace    Mucus, UA neg    Epithelial cells, urine per micros 0-2    Crystals, Ur, HPF, POC neg    Casts, Ur, LPF, POC neg    Yeast, UA neg   POCT urinalysis dipstick  Result Value Ref Range   Color, UA yellow    Clarity, UA clear    Glucose, UA neg    Bilirubin, UA neg    Ketones, UA neg    Spec  Grav, UA 1.025    Blood, UA trace    pH, UA 5.0    Protein, UA neg    Urobilinogen, UA 0.2    Nitrite, UA neg    Leukocytes, UA Negative     Assessment & Plan:  8:40 AM- Patient informed of current plan for treatment and evaluation and agrees with plan at this time. Will treat with Flomax and schedule stone protocol CT abdomen pelvis rule out stone.I personally performed the services described in this documentation, which was scribed in my presence. The recorded information has been reviewed and is accurate. Repeat blood pressure was normal.

## 2014-04-27 LAB — URINE CULTURE: Colony Count: 2000

## 2014-04-29 ENCOUNTER — Ambulatory Visit
Admission: RE | Admit: 2014-04-29 | Discharge: 2014-04-29 | Disposition: A | Discharge status: Home / Self Care | Payer: BC Managed Care – PPO | Source: Ambulatory Visit | Attending: Emergency Medicine | Admitting: Emergency Medicine

## 2014-04-29 LAB — GC/CHLAMYDIA PROBE AMP
CT Probe RNA: NEGATIVE
GC Probe RNA: NEGATIVE

## 2014-09-15 ENCOUNTER — Encounter: Payer: Self-pay | Admitting: Family Medicine

## 2015-05-27 ENCOUNTER — Ambulatory Visit (INDEPENDENT_AMBULATORY_CARE_PROVIDER_SITE_OTHER): Payer: BLUE CROSS/BLUE SHIELD | Admitting: Physician Assistant

## 2015-05-27 VITALS — BP 122/80 | HR 68 | Temp 98.3°F | Resp 18 | Ht 66.75 in | Wt 167.2 lb

## 2015-05-27 DIAGNOSIS — J0100 Acute maxillary sinusitis, unspecified: Secondary | ICD-10-CM

## 2015-05-27 DIAGNOSIS — L0201 Cutaneous abscess of face: Secondary | ICD-10-CM | POA: Diagnosis not present

## 2015-05-27 MED ORDER — DOXYCYCLINE HYCLATE 100 MG PO CAPS: 100.0000 mg | ORAL_CAPSULE | Freq: Two times a day (BID) | ORAL | Status: AC

## 2015-05-27 NOTE — Patient Instructions (Signed)
Take doxy twice a day for 7 days. Avoid excess sun exposure. Eat lots of yogurt. Return in 7 days if symptoms not improving. Stay away from nasal sprays for next couple days.

## 2015-05-27 NOTE — Progress Notes (Signed)
[facility] [address], [place] [address] [phone]  Date:  05/27/2015   Name:  Rodney Bailey   DOB:  07/06/1951   MRN:  [id number]  PCP:  No primary care provider on file.    Chief Complaint: Facial Pain and Sore   History of Present Illness:  This is a 64 y.o. male with PMH allergic rhinitis who is presenting with facial pain and sore right nostril x 2 days. Pt states over the past 2 weeks he has had more sneezing. He states his nostrils were burning. He was using astelin nasal spray as well as another nasal spray (unknown name) prescribed by his allergist.  Wilburn Mylar he developed a "cold sore" in his right nostril which has never happened before. The area is quite tender and the nostril is swollen. Today when he woke he had a tender area in front of his right ear. It is no longer tender but he states he had still tell it is swollen. He is having some right sided sinus tenderness. No significant nasal drainage. He denies fever, chills, cough, sore throat, sob/wheezing. No hx asthma. He is not a smoker.  Review of Systems:  Review of Systems See HPI  Patient Active Problem List   Diagnosis Date Noted  . Hematuria 04/26/2014    Prior to Admission medications   Medication Sig Start Date End Date Taking? Authorizing Provider  azelastine (ASTELIN) 137 MCG/SPRAY nasal spray Place 1 spray into the nose 2 (two) times daily. Use in each nostril as directed 09/11/12  Yes [name], PA-C  CALCIUM PO Take by mouth daily.   Yes Historical Provider, MD         GARLIC PO Take by mouth daily.   Yes Historical Provider, MD  GLUCOSAMINE PO Take by mouth daily.   Yes Historical Provider, MD  Melatonin 2.5 MG CAPS Take by mouth at bedtime.   Yes Historical Provider, MD  mometasone (NASONEX) 50 MCG/ACT nasal spray Place 2 sprays into the nose daily. 09/11/12  Yes [name], PA-C  Multiple Vitamin (MULTIVITAMIN) tablet Take 1 tablet by mouth daily.   Yes Historical  Provider, MD  Multiple Vitamins-Minerals (ZINC PO) Take by mouth daily.   Yes Historical Provider, MD  naproxen (NAPROSYN) 250 MG tablet Take 250 mg by mouth as needed.   Yes Historical Provider, MD  Omega-3 Fatty Acids (OMEGA 3 PO) Take by mouth daily.   Yes Historical Provider, MD  saw palmetto 160 MG capsule Take 160 mg by mouth 2 (two) times daily.   Yes Historical Provider, MD  tamsulosin (FLOMAX) 0.4 MG CAPS capsule Take 1 capsule (0.4 mg total) by mouth daily. 04/26/14  Yes [name], MD  TURMERIC PO Take by mouth daily.   Yes Historical Provider, MD    No Known Allergies  History reviewed. No pertinent past surgical history.  Social History  Substance Use Topics  . Smoking status: Never Smoker   . Smokeless tobacco: None  . Alcohol Use: No    Family History  Problem Relation Age of Onset  . Heart disease Mother     CHF  . Stroke Father     Medication list has been reviewed and updated.  Physical Examination:  Physical Exam  Constitutional: He is oriented to person, place, and time. He appears well-developed and well-nourished. No distress.  HENT:  Head: Normocephalic and atraumatic.  Right Ear: Hearing, external ear and ear canal normal. Tympanic membrane  is retracted.  Left Ear: Hearing, external ear and ear canal normal. Tympanic membrane is retracted.  Nose: Right sinus exhibits maxillary sinus tenderness. Right sinus exhibits no frontal sinus tenderness. Left sinus exhibits no maxillary sinus tenderness and no frontal sinus tenderness.  Mouth/Throat: Uvula is midline, oropharynx is clear and moist and mucous membranes are normal.  Palpable, mobile, non-tender right preauricular node No postauricular nodes.  No soft tissue swelling. No mastoid or auricle swelling/tenderness  1 cm area of swelling and erythema right nostril, septal side. Yellow pustule and crusting surrounding. Fluctuance present. TTP.  Eyes: Conjunctivae and lids are normal. Right eye  exhibits no discharge. Left eye exhibits no discharge. No scleral icterus.  Cardiovascular: Normal rate, regular rhythm, normal heart sounds and normal pulses.   No murmur heard. Pulmonary/Chest: Effort normal and breath sounds normal. No respiratory distress. He has no wheezes. He has no rhonchi. He has no rales.  Musculoskeletal: Normal range of motion.  Lymphadenopathy:       Head (right side): Preauricular adenopathy present. No submental, no submandibular, no tonsillar, no posterior auricular and no occipital adenopathy present.       Head (left side): No submental, no submandibular, no tonsillar, no preauricular, no posterior auricular and no occipital adenopathy present.    He has no cervical adenopathy.       Right: No supraclavicular adenopathy present.       Left: No supraclavicular adenopathy present.  Neurological: He is alert and oriented to person, place, and time.  Skin: Skin is warm, dry and intact. No lesion and no rash noted.  Psychiatric: He has a normal mood and affect. His speech is normal and behavior is normal. Thought content normal.   BP 122/80 mmHg  Pulse 68  Temp(Src) 98.3 F (36.8 C) (Oral)  Resp 18  Ht 5' 6.75" (1.695 m)  Wt 167 lb 3.2 oz (75.841 kg)  BMI 26.40 kg/m2  SpO2 97%  Procedure: Verbal consent obtained. Skin was cleaned with alcohol and anesthetized with 1 cc 1% lido without epi. A 0.25 cm incision was made. A small amount of purulent material expressed. Mostly blood expressed. Wound cavity size small. Wound not packed.. Wound dressed and wound care discussed.  Assessment and Plan:  1. Abscess of face 2. Acute maxillary sinusitis Abscess of right nostril with preauricular node swelling. I&D with very small amount purulence, mostly blood. Culture not obtained. Will place on doxy for sinusitis and for abscess. Advised he stay away from nasal sprays for a few days. Return in 1 week if symptoms not improving or at any time if symptoms  worsen/change. - doxycycline (VIBRAMYCIN) 100 MG capsule; Take 1 capsule (100 mg total) by mouth 2 (two) times daily. AVOID EXCESS SUN EXPOSURE WHILE ON THIS MEDICATION  Dispense: 14 capsule; Refill: 0  Benjaman Pott. Drenda Freeze, MHS [facility]  05/27/2015

## 2015-11-03 ENCOUNTER — Ambulatory Visit (INDEPENDENT_AMBULATORY_CARE_PROVIDER_SITE_OTHER): Payer: BLUE CROSS/BLUE SHIELD | Admitting: Emergency Medicine

## 2015-11-03 VITALS — BP 140/80 | HR 65 | Temp 98.0°F | Resp 18 | Ht 66.75 in | Wt 166.0 lb

## 2015-11-03 DIAGNOSIS — G4700 Insomnia, unspecified: Secondary | ICD-10-CM

## 2015-11-03 DIAGNOSIS — R351 Nocturia: Secondary | ICD-10-CM | POA: Diagnosis not present

## 2015-11-03 DIAGNOSIS — R350 Frequency of micturition: Secondary | ICD-10-CM

## 2015-11-03 LAB — POCT URINALYSIS DIP (MANUAL ENTRY)
BILIRUBIN UA: NEGATIVE
BILIRUBIN UA: NEGATIVE
GLUCOSE UA: NEGATIVE
LEUKOCYTES UA: NEGATIVE
Nitrite, UA: NEGATIVE
PROTEIN UA: NEGATIVE
Spec Grav, UA: <=1.005
Urobilinogen, UA: 0.2
pH, UA: 5

## 2015-11-03 LAB — POCT CBC
GRANULOCYTE PERCENT: 58.3 % (ref 37–80)
HEMATOCRIT: 45.5 % (ref 43.5–53.7)
Hemoglobin: 16.7 g/dL (ref 14.1–18.1)
LYMPH, POC: 1.7 (ref 0.6–3.4)
MCH, POC: 32.6 pg — AB (ref 27–31.2)
MCHC: 36.7 g/dL — AB (ref 31.8–35.4)
MCV: 89 fL (ref 80–97)
MID (CBC): 0.7 (ref 0–0.9)
MPV: 7.5 fL (ref 0–99.8)
POC GRANULOCYTE: 3.3 (ref 2–6.9)
POC LYMPH %: 29.9 % (ref 10–50)
POC MID %: 11.8 % (ref 0–12)
Platelet Count, POC: 204 10*3/uL (ref 142–424)
RBC: 5.11 M/uL (ref 4.69–6.13)
RDW, POC: 13.3 %
WBC: 5.6 10*3/uL (ref 4.6–10.2)

## 2015-11-03 LAB — POC MICROSCOPIC URINALYSIS (UMFC): MUCUS RE: ABSENT

## 2015-11-03 MED ORDER — TAMSULOSIN HCL 0.4 MG PO CAPS: 0.4000 mg | ORAL_CAPSULE | Freq: Every day | ORAL | Status: DC

## 2015-11-03 NOTE — Progress Notes (Addendum)
By signing my name below, I, [name], attest that this documentation has been prepared under the direction and in the presence of [name], MD.  Electronically Signed: [name], ED Scribe. 11/03/2015. 8:14 AM.  Chief Complaint:  Chief Complaint  Patient presents with  . Medication Refill    Tamsulosin    HPI: Rodney Bailey is a 65 y.o. male who reports to [facility] today for a medication refill of Flomax. Pt last seen me 04/2014 with dysuria and urinary frequency. At that time, pt was treated with flomax, which he states worked well for him. Pt has been waking up a couple times a night to urinate. He reports difficulty falling back to sleep after waking up the first time urinate but states flomax helps with this as well. Last PSA 1.47 03/2014.  Pt is UTD on colonoscopy.  Past Medical History  Diagnosis Date  . Allergy   . Arthritis    History reviewed. No pertinent past surgical history. Social History   Social History  . Marital Status: Married    Spouse Name: [name]  . Number of Children: [name]  . Years of Education: [name]   Social History Main Topics  . Smoking status: Never Smoker   . Smokeless tobacco: None  . Alcohol Use: No  . Drug Use: No  . Sexual Activity: Yes    Birth Control/ Protection: None     Comment: SEX PARTNERS IN THE LAST 12 MONTHS 1 AND CURRENT BIRTH CONTROL - NOT NEEDED   Other Topics Concern  . None   Social History Narrative   EXERCISE WALKING AND WEIGHT TRAINING 5-6 DAYS/WEEK FOR 30 MINUTES   Family History  Problem Relation Age of Onset  . Heart disease Mother     CHF  . Stroke Father    No Known Allergies Prior to Admission medications   Medication Sig Start Date End Date Taking? Authorizing Provider  azelastine (ASTELIN) 137 MCG/SPRAY nasal spray Place 1 spray into the nose 2 (two) times daily. Use in each nostril as directed 09/11/12  Yes [name], PA-C  CALCIUM PO Take by mouth daily.   Yes Historical Provider, MD  GARLIC PO Take  by mouth daily.   Yes Historical Provider, MD  GLUCOSAMINE PO Take by mouth daily.   Yes Historical Provider, MD  Melatonin 2.5 MG CAPS Take by mouth at bedtime.   Yes Historical Provider, MD  mometasone (NASONEX) 50 MCG/ACT nasal spray Place 2 sprays into the nose daily. 09/11/12  Yes [name], PA-C  Multiple Vitamin (MULTIVITAMIN) tablet Take 1 tablet by mouth daily.   Yes Historical Provider, MD  Multiple Vitamins-Minerals (ZINC PO) Take by mouth daily.   Yes Historical Provider, MD  naproxen (NAPROSYN) 250 MG tablet Take 250 mg by mouth as needed.   Yes Historical Provider, MD  Omega-3 Fatty Acids (OMEGA 3 PO) Take by mouth daily.   Yes Historical Provider, MD  saw palmetto 160 MG capsule Take 160 mg by mouth 2 (two) times daily.   Yes Historical Provider, MD  tamsulosin (FLOMAX) 0.4 MG CAPS capsule Take 1 capsule (0.4 mg total) by mouth daily. 04/26/14  Yes [name], MD  TURMERIC PO Take by mouth daily.   Yes Historical Provider, MD     ROS: The patient denies fevers, chills, night sweats, unintentional weight loss, chest pain, palpitations, wheezing, dyspnea on exertion, nausea, vomiting, abdominal pain, dysuria, hematuria, melena, numbness, weakness, or tingling.  All other systems  have been reviewed and were otherwise negative with the exception of those mentioned in the HPI and as above.    PHYSICAL EXAM: Filed Vitals:   11/03/15 0811  BP: 140/80  Pulse: 65  Temp: 98 F (36.7 C)  Resp: 18   Body mass index is 26.21 kg/(m^2).   General: Alert, no acute distress HEENT:  Normocephalic, atraumatic, oropharynx patent. Eye: [facility] Cardiovascular:  Regular rate and rhythm, no rubs murmurs or gallops.  No Carotid bruits, radial pulse intact. No pedal edema.  Respiratory: Clear to auscultation bilaterally.  No wheezes, rales, or rhonchi.  No cyanosis, no use of accessory musculature Abdominal: No organomegaly, abdomen is soft and non-tender, positive bowel sounds.  No  masses. Musculoskeletal: Gait intact. No edema, tenderness Skin: No rashes. Neurologic: Facial musculature symmetric. Psychiatric: Patient acts appropriately throughout our interaction. Lymphatic: No cervical or submandibular lymphadenopathy GU: prostate small, symmetrical without nodules.   LABS: Results for orders placed or performed in visit on 11/03/15  POCT CBC  Result Value Ref Range   WBC 5.6 4.6 - 10.2 K/uL   Lymph, poc 1.7 0.6 - 3.4   POC LYMPH PERCENT 29.9 10 - 50 %L   MID (cbc) 0.7 0 - 0.9   POC MID % 11.8 0 - 12 %M   POC Granulocyte 3.3 2 - 6.9   Granulocyte percent 58.3 37 - 80 %G   RBC 5.11 4.69 - 6.13 M/uL   Hemoglobin 16.7 14.1 - 18.1 g/dL   HCT, POC 45.5 43.5 - 53.7 %   MCV 89.0 80 - 97 fL   MCH, POC 32.6 (A) 27 - 31.2 pg   MCHC 36.7 (A) 31.8 - 35.4 g/dL   RDW, POC 13.3 %   Platelet Count, POC 204 142 - 424 K/uL   MPV 7.5 0 - 99.8 fL  POCT urinalysis dipstick  Result Value Ref Range   Color, UA yellow yellow   Clarity, UA clear clear   Glucose, UA negative negative   Bilirubin, UA negative negative   Ketones, POC UA negative negative   Spec Grav, UA <=1.005    Blood, UA small (A) negative   pH, UA 5.0    Protein Ur, POC negative negative   Urobilinogen, UA 0.2    Nitrite, UA Negative Negative   Leukocytes, UA Negative Negative  POCT Microscopic Urinalysis ([facility])  Result Value Ref Range   WBC,UR,HPF,POC None None WBC/hpf   RBC,UR,HPF,POC None None RBC/hpf   Bacteria Few (A) None, Too numerous to count   Mucus Absent Absent   Epithelial Cells, UR Per Microscopy None None, Too numerous to count cells/hpf    ASSESSMENT/PLAN: Urine had small blood on dipstick but nothing under the microscope. PSA was done. Flomax was refilled.I personally performed the services described in this documentation, which was scribed in my presence. The recorded information has been reviewed and is accurate.Of note patient had a CT abdomen and pelvis in November 2015 with  normal kidneys and collecting system and mild prosthetic enlargement.   Gross sideeffects, risk and benefits, and alternatives of medications d/w patient. Patient is aware that all medications have potential sideeffects and we are unable to predict every sideeffect or drug-drug interaction that may occur.  [name] MD 11/03/2015 8:13 AM

## 2015-11-03 NOTE — Patient Instructions (Addendum)
Please consider having a general physical exam in the near future. I have refilled her Flomax. I will call you with your PSA results.    IF you received an x-ray today, you will receive an invoice from [facility]. Please contact [facility] at [phone] with questions or concerns regarding your invoice.   IF you received labwork today, you will receive an invoice from [employer]. Please contact [facility] at [phone] with questions or concerns regarding your invoice.   Our billing staff will not be able to assist you with questions regarding bills from these companies.  You will be contacted with the lab results as soon as they are available. The fastest way to get your results is to activate your My Chart account. Instructions are located on the last page of this paperwork. If you have not heard from [place] regarding the results in 2 weeks, please contact this office.

## 2015-11-04 LAB — PSA: PSA: 1.6 ng/mL (ref <=4.00)

## 2016-11-01 ENCOUNTER — Encounter: Payer: Self-pay | Admitting: Physician Assistant

## 2016-11-01 ENCOUNTER — Ambulatory Visit (INDEPENDENT_AMBULATORY_CARE_PROVIDER_SITE_OTHER): Payer: Medicare HMO | Admitting: Physician Assistant

## 2016-11-01 VITALS — BP 128/82 | HR 72 | Temp 98.4°F | Resp 16 | Ht 66.0 in | Wt 167.2 lb

## 2016-11-01 DIAGNOSIS — G4700 Insomnia, unspecified: Secondary | ICD-10-CM

## 2016-11-01 MED ORDER — TRAZODONE HCL 50 MG PO TABS: 25.0000 mg | ORAL_TABLET | Freq: Every evening | ORAL | 1 refills | Status: DC | PRN

## 2016-11-01 NOTE — Patient Instructions (Signed)
     IF you received an x-ray today, you will receive an invoice from [facility]. Please contact  Radiology at [phone] with questions or concerns regarding your invoice.   IF you received labwork today, you will receive an invoice from [facility]. Please contact [facility] at [phone] with questions or concerns regarding your invoice.   Our billing staff will not be able to assist you with questions regarding bills from these companies.  You will be contacted with the lab results as soon as they are available. The fastest way to get your results is to activate your My Chart account. Instructions are located on the last page of this paperwork. If you have not heard from us regarding the results in 2 weeks, please contact this office.     

## 2016-11-01 NOTE — Progress Notes (Signed)
Patient ID: Rodney Bailey, male    DOB: 1950-08-17, 66 y.o.   MRN: [id number]  PCP: Wendie Agreste, MD  Chief Complaint  Patient presents with  . Trouble Sleeping    x1-2 weeks    Subjective:   Presents for evaluation of difficulty sleeping x 1-2 months. He has seen commercials for Ambien and Lunesta and thinks he needs something like that.  Previously worked at night, and had trouble then as well. Has used supplements from the health food store, including melatonin. Wife has been ill recently, and he has needed OTC diphenhydramine in order to sleep.  His wife is experiencing digestive and hormone problems, and her health care providers are working to get things managed. She reportedly was on anafranil for years and is now experiencing panic attacks and feels like she's having a heart attack.  Initially, he falls asleep easily. Awakens during the night to urinate, and then unable to go back to sleep for several hours. "Just laying there, restless, thinking." No increased urinary symptoms with regular use of antihistamines.  No palpitations, change in bowel habits, change in skin/hair/nails. No heat/cold intolerance.   Review of Systems No chest pain, SOB, HA, dizziness, vision change, N/V, diarrhea, constipation, dysuria, urinary urgency or frequency, myalgias, arthralgias or rash.     Patient Active Problem List   Diagnosis Date Noted  . Nocturia 11/03/2015  . Hematuria 04/26/2014     Prior to Admission medications   Medication Sig Start Date End Date Taking? Authorizing Provider  azelastine (ASTELIN) 137 MCG/SPRAY nasal spray Place 1 spray into the nose 2 (two) times daily. Use in each nostril as directed 09/11/12  Yes Dunn, Areta Haber, PA-C  CALCIUM PO Take by mouth daily.   Yes [provider]  GARLIC PO Take by mouth daily.   Yes [provider]  GLUCOSAMINE PO Take by mouth daily.   Yes [provider]  Melatonin 2.5 MG CAPS Take  by mouth at bedtime.   Yes [provider]  mometasone (NASONEX) 50 MCG/ACT nasal spray Place 2 sprays into the nose daily. 09/11/12  Yes Dunn, Areta Haber, PA-C  Multiple Vitamin (MULTIVITAMIN) tablet Take 1 tablet by mouth daily.   Yes [provider]  Multiple Vitamins-Minerals (ZINC PO) Take by mouth daily.   Yes [provider]  naproxen (NAPROSYN) 250 MG tablet Take 250 mg by mouth as needed.   Yes [provider]  Omega-3 Fatty Acids (OMEGA 3 PO) Take by mouth daily.   Yes [provider]  saw palmetto 160 MG capsule Take 160 mg by mouth 2 (two) times daily.   Yes [provider]  TURMERIC PO Take by mouth daily.   Yes [provider]  tamsulosin (FLOMAX) 0.4 MG CAPS capsule Take 1 capsule (0.4 mg total) by mouth daily. Patient not taking: Reported on 11/01/2016 11/03/15   [name], MD     No Known Allergies     Objective:  Physical Exam  Constitutional: He is oriented to person, place, and time. He appears well-developed and well-nourished. He is active and cooperative. No distress.  BP 128/82 (BP Location: Left Arm, Cuff Size: Normal)   Pulse 72   Temp 98.4 F (36.9 C) (Oral)   Resp 16   Ht 5\' 6"  (1.676 m)   Wt 167 lb 3.2 oz (75.8 kg)   SpO2 95%   BMI 26.99 kg/m   HENT:  Head: Normocephalic and atraumatic.  Right  Ear: Hearing normal.  Left Ear: Hearing normal.  Eyes: Conjunctivae are normal. No scleral icterus.  Neck: Normal range of motion. Neck supple. No thyromegaly present.  Cardiovascular: Normal rate, regular rhythm and normal heart sounds.   Pulses:      Radial pulses are 2+ on the right side, and 2+ on the left side.  Pulmonary/Chest: Effort normal and breath sounds normal.  Lymphadenopathy:       Head (right side): No tonsillar, no preauricular, no posterior auricular and no occipital adenopathy present.       Head (left side): No tonsillar, no preauricular, no posterior auricular and no occipital  adenopathy present.    He has no cervical adenopathy.       Right: No supraclavicular adenopathy present.       Left: No supraclavicular adenopathy present.  Neurological: He is alert and oriented to person, place, and time. No sensory deficit.  Skin: Skin is warm, dry and intact. No rash noted. No cyanosis or erythema. Nails show no clubbing.  Psychiatric: He has a normal mood and affect. His speech is normal and behavior is normal.           Assessment & Plan:   Problem List Items Addressed This Visit    Insomnia - Primary    Likely previously experienced shift work sleep disorder. Now most likely the stress of his wife's health situation. Discussed treatment options and advised against initial treatment with potentially habit forming drugs. Trial of trazodone.       Relevant Medications   traZODone (DESYREL) 50 MG tablet       Return in about 6 weeks (around 12/13/2016) for re-evaluation of sleep with Darlin Coco, PA-C or Jacklyn Shell, MD.   Fara Chute, PA-C [facility]

## 2016-11-02 DIAGNOSIS — G4700 Insomnia, unspecified: Secondary | ICD-10-CM | POA: Insufficient documentation

## 2016-11-02 NOTE — Assessment & Plan Note (Signed)
Likely previously experienced shift work sleep disorder. Now most likely the stress of his wife's health situation. Discussed treatment options and advised against initial treatment with potentially habit forming drugs. Trial of trazodone.

## 2016-12-16 ENCOUNTER — Ambulatory Visit: Payer: Medicare HMO | Admitting: Physician Assistant

## 2016-12-30 ENCOUNTER — Ambulatory Visit: Payer: Medicare HMO | Admitting: Physician Assistant

## 2017-01-16 ENCOUNTER — Telehealth: Payer: Self-pay

## 2017-01-16 NOTE — Telephone Encounter (Signed)
Spoke with patient in regards to scheduling an AWV. 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 -nr

## 2017-01-23 ENCOUNTER — Ambulatory Visit: Payer: Medicare HMO | Admitting: Physician Assistant

## 2017-02-14 ENCOUNTER — Ambulatory Visit: Payer: Medicare HMO | Admitting: Physician Assistant

## 2017-03-10 ENCOUNTER — Ambulatory Visit (INDEPENDENT_AMBULATORY_CARE_PROVIDER_SITE_OTHER): Payer: Medicare HMO | Admitting: Physician Assistant

## 2017-03-10 ENCOUNTER — Encounter: Payer: Self-pay | Admitting: Physician Assistant

## 2017-03-10 VITALS — BP 128/82 | HR 88 | Temp 98.0°F | Resp 16 | Ht 66.93 in | Wt 164.0 lb

## 2017-03-10 DIAGNOSIS — G4700 Insomnia, unspecified: Secondary | ICD-10-CM | POA: Diagnosis not present

## 2017-03-10 DIAGNOSIS — Z23 Encounter for immunization: Secondary | ICD-10-CM | POA: Diagnosis not present

## 2017-03-10 MED ORDER — TRAZODONE HCL 100 MG PO TABS: 100.0000 mg | ORAL_TABLET | Freq: Every evening | ORAL | 0 refills | Status: DC | PRN

## 2017-03-10 NOTE — Progress Notes (Signed)
Subjective:    Patient ID: Rodney Bailey, male    DOB: [birth year], 66 y.o.   MRN: [id number]  HPI  Chief Complaint  Patient presents with  . Insomnia    follow-up   Patient presents today for re-evaluation of his insomnia. He states he has been taking 2 capsules of Snooze-In with melatonin, 3 capsules of 250mg  of magnesium, 2 tablets of 50mg  of Trazadone. When he is running low on trazadone, he will switch out one of the tablets for a 50mg  diphenhydramine. When he takes 1 benadryl and 1 trazadone, he will experience some night sweats. Patient states that his wife passed on 02-Mar-2017 and he had difficulty sleeping then. He reports that his sleeping "is getting better everyday." Patient report he has to take the combination of pills every night to help him sleep, if not the Trazadone is not effective on its own. He thinks he is getting about 6-7 hours a sleep on a good night and 4 hours on a bad night; averages about 5-6 hours a night. He states he watches TV too much and will watch before bedtime. Only on the nights he gets 4 hours of sleep, he will take a nap in the afternoon.   He relates since his wife has passed, he has been increasing his exercise level. He is increasing his walking to 15 minutes. He works out at [employer] with [employer] twice a week.   Review of Systems  Constitutional: Negative for fever.  Respiratory: Negative for cough and shortness of breath.   Cardiovascular: Negative for chest pain and palpitations.  Gastrointestinal: Negative for abdominal pain, blood in stool, constipation, diarrhea, nausea and vomiting.  Musculoskeletal: Negative for myalgias.  Neurological: Negative for dizziness, light-headedness and headaches.   Patient Active Problem List   Diagnosis Date Noted  . Insomnia 11/02/2016  . Nocturia 11/03/2015  . Hematuria 04/26/2014   Prior to Admission medications   Medication Sig Start Date End Date Taking? Authorizing Provider  azelastine (ASTELIN) 137  MCG/SPRAY nasal spray Place 1 spray into the nose 2 (two) times daily. Use in each nostril as directed 09/11/12  Yes Dunn, Areta Haber, PA-C  GLUCOSAMINE PO Take by mouth daily.   Yes [provider]  Melatonin 2.5 MG CAPS Take by mouth at bedtime.   Yes [provider]  naproxen (NAPROSYN) 250 MG tablet Take by mouth 2 (two) times daily with a meal.   Yes [provider]  saw palmetto 160 MG capsule Take 160 mg by mouth 2 (two) times daily.   Yes [provider]  traZODone (DESYREL) 50 MG tablet Take 0.5-2 tablets (25-100 mg total) by mouth at bedtime as needed for sleep. 11/01/16  Yes Jeffery, Chelle, PA-C  TURMERIC PO Take by mouth daily.   Yes [provider]   No Known Allergies Social History   Social History  . Marital status: Widowed    Spouse name: [name]  . Number of children: [name]  . Years of education: [name]   Occupational History  . Not on file.   Social History Main Topics  . Smoking status: Never Smoker  . Smokeless tobacco: Never Used  . Alcohol use No  . Drug use: No  . Sexual activity: Yes    Birth control/ protection: None     Comment: SEX PARTNERS IN THE LAST 12 MONTHS 1 AND CURRENT BIRTH CONTROL - NOT NEEDED   Other Topics Concern  . Not on file   Social History  Narrative   EXERCISE WALKING AND WEIGHT TRAINING 5-6 DAYS/WEEK FOR 30 MINUTES      Objective:   Physical Exam  Constitutional: He is oriented to person, place, and time. He appears well-developed and well-nourished. No distress.  HENT:  Head: Normocephalic and atraumatic.  Right Ear: External ear normal.  Left Ear: External ear normal.  Neck: Neck supple. No JVD present. No tracheal deviation present. No thyromegaly present.  Cardiovascular: Normal rate, regular rhythm, normal heart sounds and intact distal pulses.  Exam reveals no gallop and no friction rub.   No murmur heard. Pulmonary/Chest: Effort normal and breath sounds normal. No stridor. No respiratory  distress. He has no wheezes. He has no rales. He exhibits no tenderness.  Lymphadenopathy:    He has no cervical adenopathy.  Neurological: He is alert and oriented to person, place, and time.  Skin: Skin is warm and dry. No rash noted. He is not diaphoretic. No erythema. No pallor.      Assessment & Plan:  1. Insomnia, unspecified type - Increase Trazadone dose from 100mg  to 150mg .  - traZODone (DESYREL) 100 MG tablet; Take 1-1.5 tablets (100-150 mg total) by mouth at bedtime as needed for sleep.  Dispense: 135 tablet; Refill: 0  2. Need for pneumococcal vaccination - Completed today in office.  - Pneumococcal conjugate vaccine 13-valent IM - Care order/instruction:  Respectfully, Denny Levy PA-S 2019

## 2017-03-10 NOTE — Progress Notes (Signed)
Patient ID: Rodney Bailey, male    DOB: 11/15/1950, 66 y.o.   MRN: [id number]  PCP: Rodney Agreste, MD  Chief Complaint  Patient presents with  . Insomnia    follow-up    Subjective:   Presents for evaluation of insomnia.  When I saw him in May, his wife's health issues were causing increased stress and insomnia. She had some psychiatric issues and history of disordered eating. In January, she stopped the Anafranil that had controlled her symptoms for 26 years. She did not follow-up with psychiatry, and developed constipation and disordered eating behaviors recurred. She stopped eating and died on 03-24-17. Hospice and Palliative care were involved for the last 8 days of her life, and have reached out to him to schedule grief support. He also has good family and social support.  Since her death, his insomnia has increased. On a "good" night, he sleeps 6-7 hours, on a "bad" night, only 4-5 hours. He sometimes takes 50 mg of trazodone and a dose of Benadryl, and adds an OTC melatonin product and magnesium.  He declines seasonal influenza vaccine and screening for Hep C and HIV. Is willing to receive the screening labs at a later date.  Review of Systems As above.  Depression screen [facility] 2/9 03/10/2017 11/01/2016 11/03/2015 05/27/2015  Decreased Interest 1 0 0 0  Down, Depressed, Hopeless 1 0 0 0  PHQ - 2 Score 2 0 0 0  Altered sleeping 3 - - -  Tired, decreased energy 3 - - -  Change in appetite 0 - - -  Feeling bad or failure about yourself  0 - - -  Trouble concentrating 0 - - -  Moving slowly or fidgety/restless 0 - - -  Suicidal thoughts 0 - - -  PHQ-9 Score 8 - - -       Patient Active Problem List   Diagnosis Date Noted  . Insomnia 11/02/2016  . Nocturia 11/03/2015  . Hematuria 04/26/2014     Prior to Admission medications   Medication Sig Start Date End Date Taking? Authorizing Provider  azelastine (ASTELIN) 137 MCG/SPRAY nasal spray Place 1 spray into  the nose 2 (two) times daily. Use in each nostril as directed 09/11/12  Yes Dunn, Areta Haber, PA-C  GLUCOSAMINE PO Take by mouth daily.   Yes [provider]  Melatonin 2.5 MG CAPS Take by mouth at bedtime.   Yes [provider]  naproxen (NAPROSYN) 250 MG tablet Take by mouth 2 (two) times daily with a meal.   Yes [provider]  saw palmetto 160 MG capsule Take 160 mg by mouth 2 (two) times daily.   Yes [provider]  traZODone (DESYREL) 50 MG tablet Take 0.5-2 tablets (25-100 mg total) by mouth at bedtime as needed for sleep. 11/01/16  Yes Tor Tsuda, PA-C  TURMERIC PO Take by mouth daily.   Yes [provider]     No Known Allergies     Objective:  Physical Exam  Constitutional: He is oriented to person, place, and time. He appears well-developed and well-nourished. He is active and cooperative. No distress.  BP 128/82   Pulse 88   Temp 98 F (36.7 C) (Oral)   Resp 16   Ht 5' 6.93" (1.7 m)   Wt 164 lb (74.4 kg)   SpO2 96%   BMI 25.74 kg/m    Eyes: Conjunctivae are normal.  Pulmonary/Chest: Effort normal.  Neurological: He is alert and  oriented to person, place, and time.  Psychiatric: He has a normal mood and affect. His speech is normal and behavior is normal.    Wt Readings from Last 3 Encounters:  03/10/17 164 lb (74.4 kg)  11/01/16 167 lb 3.2 oz (75.8 kg)  11/03/15 166 lb (75.3 kg)          Assessment & Plan:   Problem List Items Addressed This Visit    Insomnia - Primary    Worse since his wife's death 2 weeks ago. INCREASE  Trazodone to 150 mg QHS. If ineffective, let me know. Would consider mirtazepine, or paroxetine.      Relevant Medications   traZODone (DESYREL) 100 MG tablet    Other Visit Diagnoses    Need for pneumococcal vaccination       Relevant Orders   Pneumococcal conjugate vaccine 13-valent IM (Completed)   Care order/instruction: (Completed)       Return in about 3 months (around  06/10/2017) for re-evaluation of insomnia, etc.   Fara Chute, PA-C [facility]

## 2017-03-10 NOTE — Patient Instructions (Signed)
     IF you received an x-ray today, you will receive an invoice from [facility]. Please contact [facility] at [phone] with questions or concerns regarding your invoice.   IF you received labwork today, you will receive an invoice from [facility]. Please contact [facility] at [phone] with questions or concerns regarding your invoice.   Our billing staff will not be able to assist you with questions regarding bills from these companies.  You will be contacted with the lab results as soon as they are available. The fastest way to get your results is to activate your My Chart account. Instructions are located on the last page of this paperwork. If you have not heard from us regarding the results in 2 weeks, please contact this office.     

## 2017-03-10 NOTE — Assessment & Plan Note (Signed)
Worse since his wife's death 2 weeks ago. INCREASE  Trazodone to 150 mg QHS. If ineffective, let me know. Would consider mirtazepine, or paroxetine.

## 2017-03-28 ENCOUNTER — Encounter: Payer: Self-pay | Admitting: Emergency Medicine

## 2017-03-28 ENCOUNTER — Ambulatory Visit (INDEPENDENT_AMBULATORY_CARE_PROVIDER_SITE_OTHER): Payer: Medicare HMO | Admitting: Emergency Medicine

## 2017-03-28 VITALS — BP 124/78 | HR 74 | Temp 98.7°F | Resp 17 | Ht 66.5 in | Wt 167.0 lb

## 2017-03-28 DIAGNOSIS — S99921A Unspecified injury of right foot, initial encounter: Secondary | ICD-10-CM

## 2017-03-28 DIAGNOSIS — S90211A Contusion of right great toe with damage to nail, initial encounter: Secondary | ICD-10-CM | POA: Diagnosis not present

## 2017-03-28 DIAGNOSIS — Z23 Encounter for immunization: Secondary | ICD-10-CM | POA: Diagnosis not present

## 2017-03-28 NOTE — Patient Instructions (Addendum)
     IF you received an x-ray today, you will receive an invoice from [facility]. Please contact [facility] at [phone] with questions or concerns regarding your invoice.   IF you received labwork today, you will receive an invoice from [facility]. Please contact [facility] at [phone] with questions or concerns regarding your invoice.   Our billing staff will not be able to assist you with questions regarding bills from these companies.  You will be contacted with the lab results as soon as they are available. The fastest way to get your results is to activate your My Chart account. Instructions are located on the last page of this paperwork. If you have not heard from [place] regarding the results in 2 weeks, please contact this office.      Subungual Hematoma A subungual hematoma means there is blood that gathers under a fingernail or toenail. A blue or dark blue color is seen under the nail. You may have pain or throbbing in the finger or toe. Follow these instructions at home:  If directed, put ice on the area. ? Put ice in a plastic bag. ? Place a towel between your skin and the bag. ? Leave the ice on for 20 minutes, 2-3 times a day.  Raise (elevate) the injured finger or toe above the level of your heart while you are sitting or lying down. Doing this will help with pain and swelling.  If part of your nail falls off, gently trim the rest of the nail.  Follow instructions from your doctor about how to take care of your injury. Make sure you: ? Wash your hands with soap and water before you change your bandage (dressing). If you cannot use soap and water, use hand sanitizer. ? Change your bandage as told by your doctor. ? Leave stitches (sutures) in place. You may have these if your doctor fixed a cut under the nail. The stitches may need to stay in place for 2 weeks or longer.  Take over-the-counter and prescription medicines only as told by your  doctor.  Keep all follow-up visits as told by your doctor. This is important. Contact a doctor if:  Your medicine does not help with your pain.  There is redness, swelling, or pain around your nail. Get help right away if:  You have fluid, blood, or pus coming from your nail.  You have a fever. This information is not intended to replace advice given to you by your health care provider. Make sure you discuss any questions you have with your health care provider. Document Released: 08/15/2011 Document Revised: 01/20/2016 Document Reviewed: 10/08/2014 Elsevier Interactive Patient Education  [employer].

## 2017-03-28 NOTE — Progress Notes (Signed)
Rodney Bailey 66 y.o.   Chief Complaint  Patient presents with  . Toe Injury    HISTORY OF PRESENT ILLNESS: This is a 66 y.o. male injured right big toe several weeks ago and developed subungual hematoma; doing better; denies pain; want it checked.  HPI   Prior to Admission medications   Medication Sig Start Date End Date Taking? Authorizing Provider  azelastine (ASTELIN) 137 MCG/SPRAY nasal spray Place 1 spray into the nose 2 (two) times daily. Use in each nostril as directed 09/11/12  Yes Dunn, Areta Haber, PA-C  GLUCOSAMINE PO Take by mouth daily.   Yes [provider]  Melatonin 2.5 MG CAPS Take by mouth at bedtime.   Yes [provider]  naproxen (NAPROSYN) 250 MG tablet Take by mouth 2 (two) times daily with a meal.   Yes [provider]  saw palmetto 160 MG capsule Take 160 mg by mouth 2 (two) times daily.   Yes [provider]  TURMERIC PO Take by mouth daily.   Yes [provider]  traZODone (DESYREL) 100 MG tablet Take 1-1.5 tablets (100-150 mg total) by mouth at bedtime as needed for sleep. Patient not taking: Reported on 03/28/2017 03/10/17   Harrison Mons, PA-C    No Known Allergies  Patient Active Problem List   Diagnosis Date Noted  . Insomnia 11/02/2016  . Nocturia 11/03/2015  . Hematuria 04/26/2014    Past Medical History:  Diagnosis Date  . Allergy   . Arthritis     No past surgical history on file.  Social History   Social History  . Marital status: Widowed    Spouse name: [name]  . Number of children: [name]  . Years of education: [name]   Occupational History  . Not on file.   Social History Main Topics  . Smoking status: Never Smoker  . Smokeless tobacco: Never Used  . Alcohol use No  . Drug use: No  . Sexual activity: Yes    Birth control/ protection: None     Comment: SEX PARTNERS IN THE LAST 12 MONTHS 1 AND CURRENT BIRTH CONTROL - NOT NEEDED   Other Topics Concern  . Not on file   Social History  Narrative   EXERCISE WALKING AND WEIGHT TRAINING 5-6 DAYS/WEEK FOR 49 MINUTES   His wife died 03/27/17    Family History  Problem Relation Age of Onset  . Heart disease Mother        CHF  . Stroke Father      Review of Systems  Constitutional: Negative.  Negative for chills and fever.  Respiratory: Negative for shortness of breath.   Cardiovascular: Negative for chest pain.  Gastrointestinal: Negative for abdominal pain, nausea and vomiting.  Skin: Negative for rash.  Neurological: Negative for dizziness and headaches.  Endo/Heme/Allergies: Negative.   All other systems reviewed and are negative.    Physical Exam  Constitutional: He is oriented to person, place, and time. He appears well-developed and well-nourished.  HENT:  Head: Normocephalic and atraumatic.  Eyes: Pupils are equal, round, and reactive to light. EOM are normal.  Neck: Normal range of motion. Neck supple.  Cardiovascular: Normal rate.   Pulmonary/Chest: Effort normal.  Musculoskeletal:  Right big toe: nail loose; +residual subungual hematoma; NVI; no infection; healing well.  Neurological: He is alert and oriented to person, place, and time. No sensory deficit. He exhibits normal muscle tone.  Skin: Skin is warm and dry. Capillary refill takes less than 2 seconds.  No rash noted.  Psychiatric: He has a normal mood and affect. His behavior is normal.  Vitals reviewed.    ASSESSMENT & PLAN: Rodney Bailey was seen today for toe injury.  Diagnoses and all orders for this visit:  Injury of toe on right foot, initial encounter  Need for influenza vaccination -     Flu Vaccine QUAD 36+ mos IM  Subungual hematoma of great toe of right foot, initial encounter    Patient Instructions       IF you received an x-ray today, you will receive an invoice from [facility]. Please contact [facility] at [phone] with questions or concerns regarding your invoice.   IF you received labwork  today, you will receive an invoice from [place]. Please contact [facility] at [phone] with questions or concerns regarding your invoice.   Our billing staff will not be able to assist you with questions regarding bills from these companies.  You will be contacted with the lab results as soon as they are available. The fastest way to get your results is to activate your My Chart account. Instructions are located on the last page of this paperwork. If you have not heard from [place] regarding the results in 2 weeks, please contact this office.      Subungual Hematoma A subungual hematoma means there is blood that gathers under a fingernail or toenail. A blue or dark blue color is seen under the nail. You may have pain or throbbing in the finger or toe. Follow these instructions at home:  If directed, put ice on the area. ? Put ice in a plastic bag. ? Place a towel between your skin and the bag. ? Leave the ice on for 20 minutes, 2-3 times a day.  Raise (elevate) the injured finger or toe above the level of your heart while you are sitting or lying down. Doing this will help with pain and swelling.  If part of your nail falls off, gently trim the rest of the nail.  Follow instructions from your doctor about how to take care of your injury. Make sure you: ? Wash your hands with soap and water before you change your bandage (dressing). If you cannot use soap and water, use hand sanitizer. ? Change your bandage as told by your doctor. ? Leave stitches (sutures) in place. You may have these if your doctor fixed a cut under the nail. The stitches may need to stay in place for 2 weeks or longer.  Take over-the-counter and prescription medicines only as told by your doctor.  Keep all follow-up visits as told by your doctor. This is important. Contact a doctor if:  Your medicine does not help with your pain.  There is redness, swelling, or pain around your nail. Get help right away if:  You  have fluid, blood, or pus coming from your nail.  You have a fever. This information is not intended to replace advice given to you by your health care provider. Make sure you discuss any questions you have with your health care provider. Document Released: 08/15/2011 Document Revised: 01/20/2016 Document Reviewed: 10/08/2014 Elsevier Interactive Patient Education  2018 [employer] Inc[employer]      Agustina Caroli, MD [facility]

## 2017-04-13 NOTE — Telephone Encounter (Signed)
done

## 2017-06-16 ENCOUNTER — Ambulatory Visit: Payer: Medicare HMO | Admitting: Physician Assistant

## 2017-11-14 DIAGNOSIS — D239 Other benign neoplasm of skin, unspecified: Secondary | ICD-10-CM | POA: Diagnosis not present

## 2017-11-14 DIAGNOSIS — D229 Melanocytic nevi, unspecified: Secondary | ICD-10-CM | POA: Diagnosis not present

## 2017-11-14 DIAGNOSIS — L821 Other seborrheic keratosis: Secondary | ICD-10-CM | POA: Diagnosis not present

## 2017-12-05 ENCOUNTER — Ambulatory Visit (INDEPENDENT_AMBULATORY_CARE_PROVIDER_SITE_OTHER): Payer: Medicare HMO | Admitting: Family Medicine

## 2017-12-05 ENCOUNTER — Other Ambulatory Visit: Payer: Self-pay

## 2017-12-05 ENCOUNTER — Encounter: Payer: Self-pay | Admitting: Family Medicine

## 2017-12-05 VITALS — BP 130/70 | HR 74 | Temp 98.4°F | Ht 66.0 in | Wt 169.8 lb

## 2017-12-05 DIAGNOSIS — Z23 Encounter for immunization: Secondary | ICD-10-CM | POA: Diagnosis not present

## 2017-12-05 DIAGNOSIS — Z13220 Encounter for screening for lipoid disorders: Secondary | ICD-10-CM

## 2017-12-05 DIAGNOSIS — Z125 Encounter for screening for malignant neoplasm of prostate: Secondary | ICD-10-CM | POA: Diagnosis not present

## 2017-12-05 DIAGNOSIS — G4700 Insomnia, unspecified: Secondary | ICD-10-CM

## 2017-12-05 DIAGNOSIS — Z0000 Encounter for general adult medical examination without abnormal findings: Secondary | ICD-10-CM | POA: Diagnosis not present

## 2017-12-05 DIAGNOSIS — Z1159 Encounter for screening for other viral diseases: Secondary | ICD-10-CM | POA: Diagnosis not present

## 2017-12-05 MED ORDER — ZOSTER VAC RECOMB ADJUVANTED 50 MCG/0.5ML IM SUSR
0.5000 mL | Freq: Once | INTRAMUSCULAR | 1 refills | Status: AC
Start: 2017-12-05 — End: 2017-12-05

## 2017-12-05 MED ORDER — TRAZODONE HCL 100 MG PO TABS: 50.0000 mg | ORAL_TABLET | Freq: Every evening | ORAL | 3 refills | Status: DC | PRN

## 2017-12-05 NOTE — Progress Notes (Signed)
Subjective:    Patient ID: Rodney Bailey, male    DOB: 1950/10/21, 67 y.o.   MRN: [id number]  HPI Rodney Bailey is a 67 y.o. male Presents today for: Chief Complaint  Patient presents with  . Annual Exam    CPE   Here for annual exam.  History of insomnia.  Notes reviewed, wife passed September of last year.  Was seen for worsening insomnia afterwards and had an increase in his trazodone dose to 150 mg daily.  Feels like he is doing better.  Has been seeing woman at church since February.  May think about marriage later this year. For sleep has been able to decrease the trazodone to half pill, and melatonin. Sleeping 6 hours per night. Feels rested. 73mn to 1 hr nap during the day.   Cancer screening: Colonoscopy: 06/06/10, told to repeat in 10 years.  Prostate cancer screen: PSA 1.6 on 10/07/2015, minimal change from 1.47 prior.  Has used saw palmetto in the past for urinary symptoms.  Immunizations: Immunization History  Administered Date(s) Administered  . Influenza,inj,Quad PF,6+ Mos 03/28/2017  . Pneumococcal Conjugate-13 03/10/2017  . Tdap 03/19/2013  shingles: has not had  Fall screen: No falls in the past year.  Depression screen: [facility] 12/05/2017 03/28/2017 03/10/2017 11/01/2016 11/03/2015  Decreased Interest 0 0 1 0 0  Down, Depressed, Hopeless 0 0 1 0 0  PHQ - 2 Score 0 0 2 0 0  Altered sleeping - - 3 - -  Tired, decreased energy - - 3 - -  Change in appetite - - 0 - -  Feeling bad or failure about yourself  - - 0 - -  Trouble concentrating - - 0 - -  Moving slowly or fidgety/restless - - 0 - -  Suicidal thoughts - - 0 - -  PHQ-9 Score - - 8 - -   Memory screen: 6CIT Screen 12/05/2017  What Year? 0 points  What month? 0 points  What time? 0 points  Count back from 20 0 points  Months in reverse 0 points  Repeat phrase 0 points  Total Score 0    Functional status screen: Functional Status Survey: Is the patient deaf or have difficulty  hearing?: Yes Does the patient have difficulty seeing, even when wearing glasses/contacts?: No Does the patient have difficulty concentrating, remembering, or making decisions?: No Does the patient have difficulty walking or climbing stairs?: No Does the patient have difficulty dressing or bathing?: No Does the patient have difficulty doing errands alone such as visiting a doctor's office or shopping?: No  Occasional trouble hearing in certain situations only. Has not had screen  Dental: 2 times per year.   Optho: No recent visit.   Visual Acuity Screening   Right eye Left eye Both eyes  Without correction: _0  With correction:        Exercise: He has exercise with walking and weight training most days per week, stationary bike.   Advanced directives: He has a healthcare power of attorney and living will.  Due for hep C screen, lipid panel.   Patient Active Problem List   Diagnosis Date Noted  . Need for influenza vaccination 03/28/2017  . Injury of toe on right foot 03/28/2017  . Subungual hematoma of great toe of right foot 03/28/2017  . Insomnia 11/02/2016  . Nocturia 11/03/2015  . Hematuria 04/26/2014   Past Medical History:  Diagnosis Date  . Allergy   .  Arthritis    No past surgical history on file. No Known Allergies Prior to Admission medications   Medication Sig Start Date End Date Taking? Authorizing Provider  azelastine (ASTELIN) 137 MCG/SPRAY nasal spray Place 1 spray into the nose 2 (two) times daily. Use in each nostril as directed 09/11/12  Yes Dunn, Areta Haber, PA-C  GLUCOSAMINE PO Take by mouth daily.   Yes [provider]  Melatonin 2.5 MG CAPS Take by mouth at bedtime.   Yes [provider]  naproxen (NAPROSYN) 250 MG tablet Take by mouth 2 (two) times daily with a meal.   Yes [provider]  saw palmetto 160 MG capsule Take 160 mg by mouth 2 (two) times daily.   Yes [provider]  traZODone (DESYREL)  100 MG tablet Take 1-1.5 tablets (100-150 mg total) by mouth at bedtime as needed for sleep. 03/10/17  Yes Jeffery, Chelle, PA-C  TURMERIC PO Take by mouth daily.   Yes [provider]   Social History   Socioeconomic History  . Marital status: Widowed    Spouse name: Not on file  . Number of children: Not on file  . Years of education: Not on file  . Highest education level: Not on file  Occupational History  . Not on file  Social Needs  . Financial resource strain: Not on file  . Food insecurity:    Worry: Not on file    Inability: Not on file  . Transportation needs:    Medical: Not on file    Non-medical: Not on file  Tobacco Use  . Smoking status: Never Smoker  . Smokeless tobacco: Never Used  Substance and Sexual Activity  . Alcohol use: No  . Drug use: No  . Sexual activity: Yes    Birth control/protection: None    Comment: SEX PARTNERS IN THE LAST 12 MONTHS 1 AND CURRENT BIRTH CONTROL - NOT NEEDED  Lifestyle  . Physical activity:    Days per week: Not on file    Minutes per session: Not on file  . Stress: Not on file  Relationships  . Social connections:    Talks on phone: Not on file    Gets together: Not on file    Attends religious service: Not on file    Active member of club or organization: Not on file    Attends meetings of clubs or organizations: Not on file    Relationship status: Not on file  . Intimate partner violence:    Fear of current or ex partner: Not on file    Emotionally abused: Not on file    Physically abused: Not on file    Forced sexual activity: Not on file  Other Topics Concern  . Not on file  Social History Narrative   EXERCISE WALKING AND WEIGHT TRAINING 5-6 DAYS/WEEK FOR 64 MINUTES   His wife died 2017-03-03    Review of Systems  13 point review of systems per patient health survey noted.  Negative other than joint pain - chronic - taking turmeric, MSM and glucosamine that has been helpful. No new concerns.       Objective:   Physical Exam  Constitutional: He is oriented to person, place, and time. He appears well-developed and well-nourished.  HENT:  Head: Normocephalic and atraumatic.  Right Ear: External ear normal.  Left Ear: External ear normal.  Mouth/Throat: Oropharynx is clear and moist.  Eyes: Pupils are equal, round, and reactive to light. Conjunctivae and EOM  are normal.  Neck: Normal range of motion. Neck supple. No thyromegaly present.  Cardiovascular: Normal rate, regular rhythm, normal heart sounds and intact distal pulses.  Pulmonary/Chest: Effort normal and breath sounds normal. No respiratory distress. He has no wheezes.  Abdominal: Soft. He exhibits no distension. There is no tenderness. Hernia confirmed negative in the right inguinal area and confirmed negative in the left inguinal area.  Genitourinary: Prostate normal.  Musculoskeletal: Normal range of motion. He exhibits no edema or tenderness.  Lymphadenopathy:    He has no cervical adenopathy.  Neurological: He is alert and oriented to person, place, and time. He has normal reflexes.  Skin: Skin is warm and dry.  Psychiatric: He has a normal mood and affect. His behavior is normal.  Vitals reviewed.  Vitals:   12/05/17 1008 12/05/17 1011  BP: (!) 142/74 130/70  Pulse: 74   Temp: 98.4 F (36.9 C)   TempSrc: Oral   SpO2: 92%   Weight: 169 lb 12.8 oz (77 kg)   Height: _0  (1.676 m)        Assessment & Plan:  Rodney Bailey is a 67 y.o. male Annual physical exam  -  - anticipatory guidance as below in AVS, screening labs if needed. Health maintenance items as above in HPI discussed/recommended as applicable.   - no concerning responses on depression, fall, or functional status screening. Any positive responses noted as above. Advanced directives discussed as in CHL.   Screening for hyperlipidemia - Plan: Comprehensive metabolic panel, Lipid panel  Screening for prostate cancer - Plan: PSA  - We discussed  pros and cons of prostate cancer screening, and after this discussion, he chose to have screening done. PSA obtained, and no concerning findings on DRE.   Need for shingles vaccine - Plan: Zoster Vaccine Adjuvanted [facility]) injection  - sent to pharmacy  Encounter for hepatitis C screening test for low risk patient - Plan: Hepatitis C antibody  Insomnia, unspecified type - Plan: traZODone (DESYREL) 100 MG tablet  Improved. Tolerating lower dose of trazodone. Refilled.   Meds ordered this encounter  Medications  . Zoster Vaccine Adjuvanted [facility]) injection    Sig: Inject 0.5 mLs into the muscle once for 1 dose. Repeat in 2-6 months.    Dispense:  0.5 mL    Refill:  1  . traZODone (DESYREL) 100 MG tablet    Sig: Take 0.5-1 tablets (50-100 mg total) by mouth at bedtime as needed for sleep.    Dispense:  90 tablet    Refill:  3   Patient Instructions   I would recommend hearing screen - let me know if you need a referral for this time.  I sent the shingles vaccine to your pharmacy.  Schedule appointment with eye care provider.   Preventive Care 61 Years and Older, Male Preventive care refers to lifestyle choices and visits with your health care provider that can promote health and wellness. What does preventive care include?  A yearly physical exam. This is also called an annual well check.  Dental exams once or twice a year.  Routine eye exams. Ask your health care provider how often you should have your eyes checked.  Personal lifestyle choices, including: ? Daily care of your teeth and gums. ? Regular physical activity. ? Eating a healthy diet. ? Avoiding tobacco and drug use. ? Limiting alcohol use. ? Practicing safe sex. ? Taking low doses of aspirin every day. ? Taking vitamin and mineral supplements as recommended  by your health care provider. What happens during an annual well check? The services and screenings done by your health care provider during your  annual well check will depend on your age, overall health, lifestyle risk factors, and family history of disease. Counseling Your health care provider may ask you questions about your:  Alcohol use.  Tobacco use.  Drug use.  Emotional well-being.  Home and relationship well-being.  Sexual activity.  Eating habits.  History of falls.  Memory and ability to understand (cognition).  Work and work Statistician.  Screening You may have the following tests or measurements:  Height, weight, and BMI.  Blood pressure.  Lipid and cholesterol levels. These may be checked every 5 years, or more frequently if you are over 10 years old.  Skin check.  Lung cancer screening. You may have this screening every year starting at age 43 if you have a 30-pack-year history of smoking and currently smoke or have quit within the past 15 years.  Fecal occult blood test (FOBT) of the stool. You may have this test every year starting at age 67.  Flexible sigmoidoscopy or colonoscopy. You may have a sigmoidoscopy every 5 years or a colonoscopy every 10 years starting at age 71.  Prostate cancer screening. Recommendations will vary depending on your family history and other risks.  Hepatitis C blood test.  Hepatitis B blood test.  Sexually transmitted disease (STD) testing.  Diabetes screening. This is done by checking your blood sugar (glucose) after you have not eaten for a while (fasting). You may have this done every 1-3 years.  Abdominal aortic aneurysm (AAA) screening. You may need this if you are a current or former smoker.  Osteoporosis. You may be screened starting at age 35 if you are at high risk.  Talk with your health care provider about your test results, treatment options, and if necessary, the need for more tests. Vaccines Your health care provider may recommend certain vaccines, such as:  Influenza vaccine. This is recommended every year.  Tetanus, diphtheria, and  acellular pertussis (Tdap, Td) vaccine. You may need a Td booster every 10 years.  Varicella vaccine. You may need this if you have not been vaccinated.  Zoster vaccine. You may need this after age 50.  Measles, mumps, and rubella (MMR) vaccine. You may need at least one dose of MMR if you were born in 1957 or later. You may also need a second dose.  Pneumococcal 13-valent conjugate (PCV13) vaccine. One dose is recommended after age 28.  Pneumococcal polysaccharide (PPSV23) vaccine. One dose is recommended after age 36.  Meningococcal vaccine. You may need this if you have certain conditions.  Hepatitis A vaccine. You may need this if you have certain conditions or if you travel or work in places where you may be exposed to hepatitis A.  Hepatitis B vaccine. You may need this if you have certain conditions or if you travel or work in places where you may be exposed to hepatitis B.  Haemophilus influenzae type b (Hib) vaccine. You may need this if you have certain risk factors.  Talk to your health care provider about which screenings and vaccines you need and how often you need them. This information is not intended to replace advice given to you by your health care provider. Make sure you discuss any questions you have with your health care provider. Document Released: 06/19/2015 Document Revised: 02/10/2016 Document Reviewed: 03/24/2015 Elsevier Interactive Patient Education  [employer].  IF you received an x-ray today, you will receive an invoice from [facility]. Please contact [facility] at [phone] with questions or concerns regarding your invoice.   IF you received labwork today, you will receive an invoice from [place]. Please contact [facility] at [phone] with questions or concerns regarding your invoice.   Our billing staff will not be able to assist you with questions regarding bills from these companies.  You will be contacted with the  lab results as soon as they are available. The fastest way to get your results is to activate your My Chart account. Instructions are located on the last page of this paperwork. If you have not heard from [place] regarding the results in 2 weeks, please contact this office.      Signed,   Merri Ray, MD [facility] [place].  12/05/17 10:05 AM

## 2017-12-05 NOTE — Patient Instructions (Addendum)
I would recommend hearing screen - let me know if you need a referral for this time.  I sent the shingles vaccine to your pharmacy.  Schedule appointment with eye care provider.   Preventive Care 67 Years and Older, Male Preventive care refers to lifestyle choices and visits with your health care provider that can promote health and wellness. What does preventive care include?  A yearly physical exam. This is also called an annual well check.  Dental exams once or twice a year.  Routine eye exams. Ask your health care provider how often you should have your eyes checked.  Personal lifestyle choices, including: ? Daily care of your teeth and gums. ? Regular physical activity. ? Eating a healthy diet. ? Avoiding tobacco and drug use. ? Limiting alcohol use. ? Practicing safe sex. ? Taking low doses of aspirin every day. ? Taking vitamin and mineral supplements as recommended by your health care provider. What happens during an annual well check? The services and screenings done by your health care provider during your annual well check will depend on your age, overall health, lifestyle risk factors, and family history of disease. Counseling Your health care provider may ask you questions about your:  Alcohol use.  Tobacco use.  Drug use.  Emotional well-being.  Home and relationship well-being.  Sexual activity.  Eating habits.  History of falls.  Memory and ability to understand (cognition).  Work and work Statistician.  Screening You may have the following tests or measurements:  Height, weight, and BMI.  Blood pressure.  Lipid and cholesterol levels. These may be checked every 5 years, or more frequently if you are over 37 years old.  Skin check.  Lung cancer screening. You may have this screening every year starting at age 18 if you have a 30-pack-year history of smoking and currently smoke or have quit within the past 15 years.  Fecal occult blood test  (FOBT) of the stool. You may have this test every year starting at age 63.  Flexible sigmoidoscopy or colonoscopy. You may have a sigmoidoscopy every 5 years or a colonoscopy every 10 years starting at age 32.  Prostate cancer screening. Recommendations will vary depending on your family history and other risks.  Hepatitis C blood test.  Hepatitis B blood test.  Sexually transmitted disease (STD) testing.  Diabetes screening. This is done by checking your blood sugar (glucose) after you have not eaten for a while (fasting). You may have this done every 1-3 years.  Abdominal aortic aneurysm (AAA) screening. You may need this if you are a current or former smoker.  Osteoporosis. You may be screened starting at age 42 if you are at high risk.  Talk with your health care provider about your test results, treatment options, and if necessary, the need for more tests. Vaccines Your health care provider may recommend certain vaccines, such as:  Influenza vaccine. This is recommended every year.  Tetanus, diphtheria, and acellular pertussis (Tdap, Td) vaccine. You may need a Td booster every 10 years.  Varicella vaccine. You may need this if you have not been vaccinated.  Zoster vaccine. You may need this after age 73.  Measles, mumps, and rubella (MMR) vaccine. You may need at least one dose of MMR if you were born in 1957 or later. You may also need a second dose.  Pneumococcal 13-valent conjugate (PCV13) vaccine. One dose is recommended after age 84.  Pneumococcal polysaccharide (PPSV23) vaccine. One dose is recommended after age 24.  Meningococcal vaccine. You may need this if you have certain conditions.  Hepatitis A vaccine. You may need this if you have certain conditions or if you travel or work in places where you may be exposed to hepatitis A.  Hepatitis B vaccine. You may need this if you have certain conditions or if you travel or work in places where you may be exposed to  hepatitis B.  Haemophilus influenzae type b (Hib) vaccine. You may need this if you have certain risk factors.  Talk to your health care provider about which screenings and vaccines you need and how often you need them. This information is not intended to replace advice given to you by your health care provider. Make sure you discuss any questions you have with your health care provider. Document Released: 06/19/2015 Document Revised: 02/10/2016 Document Reviewed: 03/24/2015 Elsevier Interactive Patient Education  2018 [employer].    IF you received an x-ray today, you will receive an invoice from [facility] Radiology. Please contact [facility] Radiology at [phone] with questions or concerns regarding your invoice.   IF you received labwork today, you will receive an invoice from [place]. Please contact [facility] at [phone] with questions or concerns regarding your invoice.   Our billing staff will not be able to assist you with questions regarding bills from these companies.  You will be contacted with the lab results as soon as they are available. The fastest way to get your results is to activate your My Chart account. Instructions are located on the last page of this paperwork. If you have not heard from [place] regarding the results in 2 weeks, please contact this office.

## 2017-12-06 LAB — COMPREHENSIVE METABOLIC PANEL WITH GFR
ALT: 21 [IU]/L (ref 0–44)
AST: 28 [IU]/L (ref 0–40)
Albumin/Globulin Ratio: 1.8 (ref 1.2–2.2)
Albumin: 4.4 g/dL (ref 3.6–4.8)
Alkaline Phosphatase: 77 [IU]/L (ref 39–117)
BUN/Creatinine Ratio: 11 (ref 10–24)
BUN: 16 mg/dL (ref 8–27)
Bilirubin Total: 2.2 mg/dL — ABNORMAL HIGH (ref 0.0–1.2)
CO2: 22 mmol/L (ref 20–29)
Calcium: 9.8 mg/dL (ref 8.6–10.2)
Chloride: 102 mmol/L (ref 96–106)
Creatinine, Ser: 1.45 mg/dL — ABNORMAL HIGH (ref 0.76–1.27)
GFR calc Af Amer: 58 mL/min/{1.73_m2} — ABNORMAL LOW
GFR calc non Af Amer: 50 mL/min/{1.73_m2} — ABNORMAL LOW
Globulin, Total: 2.4 g/dL (ref 1.5–4.5)
Glucose: 87 mg/dL (ref 65–99)
Potassium: 4.4 mmol/L (ref 3.5–5.2)
Sodium: 138 mmol/L (ref 134–144)
Total Protein: 6.8 g/dL (ref 6.0–8.5)

## 2017-12-06 LAB — LIPID PANEL
Chol/HDL Ratio: 4.3 ratio (ref 0.0–5.0)
Cholesterol, Total: 229 mg/dL — ABNORMAL HIGH (ref 100–199)
HDL: 53 mg/dL
LDL Calculated: 161 mg/dL — ABNORMAL HIGH (ref 0–99)
Triglycerides: 77 mg/dL (ref 0–149)
VLDL Cholesterol Cal: 15 mg/dL (ref 5–40)

## 2017-12-06 LAB — HEPATITIS C ANTIBODY: Hep C Virus Ab: <0.1 {s_co_ratio} (ref 0.0–0.9)

## 2017-12-06 LAB — PSA: PROSTATE SPECIFIC AG, SERUM: 2.6 ng/mL (ref 0.0–4.0)

## 2017-12-12 DIAGNOSIS — H5203 Hypermetropia, bilateral: Secondary | ICD-10-CM | POA: Diagnosis not present

## 2017-12-12 DIAGNOSIS — H52223 Regular astigmatism, bilateral: Secondary | ICD-10-CM | POA: Diagnosis not present

## 2017-12-19 ENCOUNTER — Telehealth: Payer: Self-pay | Admitting: Family Medicine

## 2017-12-19 NOTE — Telephone Encounter (Signed)
Lab letter sent 12/16/2017

## 2017-12-19 NOTE — Telephone Encounter (Signed)
Copied from [place] [id number]. Topic: Quick Communication - Lab Results >> Dec 19, 2017 11:00 AM Cecelia Byars, NT wrote: Patient called and would like a copy of his labs for his lab results sent to him in the mail.

## 2017-12-29 ENCOUNTER — Telehealth: Payer: Self-pay | Admitting: Family Medicine

## 2017-12-29 DIAGNOSIS — R7989 Other specified abnormal findings of blood chemistry: Secondary | ICD-10-CM

## 2017-12-29 NOTE — Telephone Encounter (Signed)
Copied from [place] [id number]. Topic: Quick Communication - See Telephone Encounter >> Dec 29, 2017 10:56 AM Rutherford Nail, NT wrote: CRM for notification. See Telephone encounter for: 12/29/17. Patient calling and states that he would like to get his Kidney function test done around 01/19/18. States that Dr Nyoka Cowden wanted him to get a recheck in 6 weeks from his last visit. Please advise.

## 2018-01-03 NOTE — Addendum Note (Signed)
Addended by: Merri Ray R on: 01/03/2018 03:18 PM   Modules accepted: Orders

## 2018-01-03 NOTE — Telephone Encounter (Signed)
Call patient.  I have placed a lab only order for him to return at that time to have kidney function test repeated.  Let me know if there are other questions

## 2018-02-20 DIAGNOSIS — Z8249 Family history of ischemic heart disease and other diseases of the circulatory system: Secondary | ICD-10-CM | POA: Diagnosis not present

## 2018-02-20 DIAGNOSIS — M1990 Unspecified osteoarthritis, unspecified site: Secondary | ICD-10-CM | POA: Diagnosis not present

## 2018-02-20 DIAGNOSIS — Z823 Family history of stroke: Secondary | ICD-10-CM | POA: Diagnosis not present

## 2018-02-20 DIAGNOSIS — G4700 Insomnia, unspecified: Secondary | ICD-10-CM | POA: Diagnosis not present

## 2018-02-20 DIAGNOSIS — Z833 Family history of diabetes mellitus: Secondary | ICD-10-CM | POA: Diagnosis not present

## 2018-02-20 DIAGNOSIS — Z803 Family history of malignant neoplasm of breast: Secondary | ICD-10-CM | POA: Diagnosis not present

## 2018-02-27 DIAGNOSIS — R69 Illness, unspecified: Secondary | ICD-10-CM | POA: Diagnosis not present

## 2018-04-01 DIAGNOSIS — J0100 Acute maxillary sinusitis, unspecified: Secondary | ICD-10-CM | POA: Diagnosis not present

## 2018-12-02 ENCOUNTER — Other Ambulatory Visit: Payer: Self-pay | Admitting: Family Medicine

## 2018-12-02 DIAGNOSIS — G4700 Insomnia, unspecified: Secondary | ICD-10-CM

## 2018-12-02 NOTE — Telephone Encounter (Signed)
Requested Prescriptions  Pending Prescriptions Disp Refills  . traZODone (DESYREL) 100 MG tablet [Pharmacy Med Name: TRAZODONE 100MG  TABLETS] 30 tablet 0    Sig: TAKE 1/2 TO 1 TABLET(50 TO 100 MG) BY MOUTH AT BEDTIME AS NEEDED FOR SLEEP     Psychiatry: Antidepressants - Serotonin Modulator Failed - 12/02/2018  3:13 AM      Failed - Valid encounter within last 6 months    Recent Outpatient Visits          12 months ago Annual physical exam   [facility], Ranell Patrick, MD   1 year ago Injury of toe on right foot, initial encounter   [facility] at [facility], Ines Bloomer, MD   1 year ago Insomnia, unspecified type   [facility] at [facility], [place], [place]   2 years ago Insomnia, unspecified type   Primary Care at [facility], [place], [place]   3 years ago Urine frequency   [facility], [facility]      Future Appointments            In 3 days Wendie Agreste, MD [facility] [place], [facility]           Failed - Completed PHQ-2 or PHQ-9 in the last 360 days.

## 2018-12-03 DIAGNOSIS — R69 Illness, unspecified: Secondary | ICD-10-CM | POA: Diagnosis not present

## 2018-12-05 ENCOUNTER — Encounter: Payer: Medicare HMO | Admitting: Family Medicine

## 2019-01-07 ENCOUNTER — Other Ambulatory Visit: Payer: Self-pay | Admitting: Family Medicine

## 2019-01-07 ENCOUNTER — Telehealth: Payer: Self-pay | Admitting: Family Medicine

## 2019-01-07 DIAGNOSIS — G4700 Insomnia, unspecified: Secondary | ICD-10-CM

## 2019-01-07 NOTE — Telephone Encounter (Signed)
Pt was informed of rx for Trazodone sent to pharmacy.

## 2019-01-07 NOTE — Telephone Encounter (Signed)
Pt called in requesting presnidone refill

## 2019-01-07 NOTE — Telephone Encounter (Signed)
Requested Prescriptions  Pending Prescriptions Disp Refills  . traZODone (DESYREL) 100 MG tablet [Pharmacy Med Name: TRAZODONE 100MG  TABLETS] 30 tablet 0    Sig: TAKE HALF TO ONE TABLET BY MOUTH AT BEDTIME AS NEEDED FOR SLEEP     Psychiatry: Antidepressants - Serotonin Modulator Failed - 01/07/2019  3:15 AM      Failed - Valid encounter within last 6 months    Recent Outpatient Visits          1 year ago Annual physical exam   [facility], Ranell Patrick, MD   1 year ago Injury of toe on right foot, initial encounter   [facility] at [facility], Ines Bloomer, MD   1 year ago Insomnia, unspecified type   Primary Care at [facility], [place], [place]   2 years ago Insomnia, unspecified type   [facility] at [facility], [place], [place]   3 years ago Urine frequency   [facility], MD             Failed - Completed PHQ-2 or PHQ-9 in the last 360 days.      Attempted to contact patient at his home number to advise he is due for appointment for CPE, busy x3.

## 2019-01-18 ENCOUNTER — Ambulatory Visit (INDEPENDENT_AMBULATORY_CARE_PROVIDER_SITE_OTHER): Payer: Medicare HMO | Admitting: Family Medicine

## 2019-01-18 ENCOUNTER — Encounter: Payer: Self-pay | Admitting: Family Medicine

## 2019-01-18 ENCOUNTER — Other Ambulatory Visit: Payer: Self-pay

## 2019-01-18 VITALS — BP 139/76 | HR 65 | Temp 98.6°F | Resp 14 | Wt 165.6 lb

## 2019-01-18 DIAGNOSIS — Z125 Encounter for screening for malignant neoplasm of prostate: Secondary | ICD-10-CM

## 2019-01-18 DIAGNOSIS — Z0000 Encounter for general adult medical examination without abnormal findings: Secondary | ICD-10-CM

## 2019-01-18 DIAGNOSIS — G4700 Insomnia, unspecified: Secondary | ICD-10-CM

## 2019-01-18 DIAGNOSIS — Z23 Encounter for immunization: Secondary | ICD-10-CM

## 2019-01-18 DIAGNOSIS — Z0001 Encounter for general adult medical examination with abnormal findings: Secondary | ICD-10-CM | POA: Diagnosis not present

## 2019-01-18 DIAGNOSIS — E785 Hyperlipidemia, unspecified: Secondary | ICD-10-CM

## 2019-01-18 DIAGNOSIS — R7989 Other specified abnormal findings of blood chemistry: Secondary | ICD-10-CM | POA: Diagnosis not present

## 2019-01-18 MED ORDER — SHINGRIX 50 MCG/0.5ML IM SUSR: 0.5000 mL | Freq: Once | INTRAMUSCULAR | 1 refills | Status: AC

## 2019-01-18 MED ORDER — TRAZODONE HCL 100 MG PO TABS: ORAL_TABLET | ORAL | 3 refills | Status: DC

## 2019-01-18 NOTE — Progress Notes (Signed)
Subjective:    Patient ID: Rodney Daub., male    DOB: 11-03-1950, 68 y.o.   MRN: [id number]  HPI Rodney Bailey. is a 68 y.o. male Presents today for: Chief Complaint  Patient presents with  . Annual Exam    Here today for annual. Patient is doing well today have hip problem but have an appt for that with Ortho   Care team: PCP - me Ortho - Dr. Wynelle Link. appt 9/18 - for L hip issues.  Chiropracter - [place] with [facility] - unknown - on [place].  Dentist: Elby Showers.   Insomnia: Trazodone 50-129m qhs.  Doing well. No new side effects, sleeping ok.   Elevated creatinine: Lab Results  Component Value Date   CREATININE 1.45 (H) 12/05/2017  Elevation noted last July, recommended 6-week follow-up at that time.  NSAID avoidance and maintenance of hydration discussed.  Has not had retested since last year.  Taking Naprosyn BID past week for hip pain. No trial of tylenol.   Hyperlipidemia:  Lab Results  Component Value Date   CHOL 229 (H) 12/05/2017   HDL 53 12/05/2017   LDLCALC 161 (H) 12/05/2017   TRIG 77 12/05/2017   CHOLHDL 4.3 12/05/2017   Lab Results  Component Value Date   ALT 21 12/05/2017   AST 28 12/05/2017   ALKPHOS 77 12/05/2017   BILITOT 2.2 (H) 12/05/2017  10-year risk score of 14.8% last July.  Initially recommended diet/exercise and repeat testing in 3 to 6 months.  Has not been retested.  Not on meds.  Statin in past may have caused muscle aches, but would consider again. Not sure which one - not on CHL.   Cancer screening: Colonoscopy 06/06/2010.  Prostate: PSA normal at 2.6 in July 2019 but had increased from 1.6 in 2017.  630-monthollow-up recommended.  Has not had testing since last July. Agrees to testing today after R/B discussion.   Immunization History  Administered Date(s) Administered  . Influenza,inj,Quad PF,6+ Mos 03/28/2017  . Influenza-Unspecified 02/27/2018  . Pneumococcal Conjugate-13 03/10/2017  . Tdap 03/19/2013   Shingles vaccine: not had - requests.   Fall screening, no falls in the past year:  [facility] 01/18/2019 12/05/2017 03/28/2017 03/10/2017 11/01/2016  Decreased Interest 0 0 0 1 0  Down, Depressed, Hopeless 0 0 0 1 0  PHQ - 2 Score 0 0 0 2 0  Altered sleeping - - - 3 -  Tired, decreased energy - - - 3 -  Change in appetite - - - 0 -  Feeling bad or failure about yourself  - - - 0 -  Trouble concentrating - - - 0 -  Moving slowly or fidgety/restless - - - 0 -  Suicidal thoughts - - - 0 -  PHQ-9 Score - - - 8 -   Functional Status Survey: Is the patient deaf or have difficulty hearing?: No Does the patient have difficulty seeing, even when wearing glasses/contacts?: No Does the patient have difficulty concentrating, remembering, or making decisions?: No Does the patient have difficulty walking or climbing stairs?: No Does the patient have difficulty dressing or bathing?: No Does the patient have difficulty doing errands alone such as visiting a doctor's office or shopping?: No  6CIT Screen 01/18/2019 12/05/2017  What Year? 0 points 0 points  What month? 0 points 0 points  What time? 0 points 0 points  Count back from 20 0 points 0 points  Months in reverse 0 points 0  points  Repeat phrase 0 points 0 points  Total Score 0 0    Hearing Screening   '125Hz'  '250Hz'  '500Hz'  '1000Hz'  '2000Hz'  '3000Hz'  '4000Hz'  '6000Hz'  '8000Hz'   Right ear:           Left ear:             Visual Acuity Screening   Right eye Left eye Both eyes  Without correction:     With correction: '20/20 20/15 20/15 '  last optho eval about a year ago, wears glasses.   Dental: Dr. Elby Showers - 6 month intervals.   Exercise: stationary bike 25-30 min 5-6 days, and cable exercises 2 times per week.   Alcohol: none  Tobacco; non smoker.   Advanced directives Living will and POA. - requested copy.   Patient Active Problem List   Diagnosis Date Noted  . Need for influenza vaccination 03/28/2017  . Injury of toe on  right foot 03/28/2017  . Subungual hematoma of great toe of right foot 03/28/2017  . Insomnia 11/02/2016  . Nocturia 11/03/2015  . Hematuria 04/26/2014   Past Medical History:  Diagnosis Date  . Allergy   . Arthritis    No past surgical history on file. No Known Allergies Prior to Admission medications   Medication Sig Start Date End Date Taking? Authorizing Provider  azelastine (ASTELIN) 137 MCG/SPRAY nasal spray Place 1 spray into the nose 2 (two) times daily. Use in each nostril as directed 09/11/12  Yes Dunn, Areta Haber, PA-C  GLUCOSAMINE PO Take by mouth daily.   Yes [provider]  Melatonin 2.5 MG CAPS Take by mouth at bedtime.   Yes [provider]  naproxen (NAPROSYN) 250 MG tablet Take 440 mg by mouth 2 (two) times daily with a meal.    Yes [provider]  saw palmetto 160 MG capsule Take 160 mg by mouth 2 (two) times daily.   Yes [provider]  traZODone (DESYREL) 100 MG tablet TAKE HALF TO ONE TABLET BY MOUTH AT BEDTIME AS NEEDED FOR SLEEP 01/07/19  Yes Wendie Agreste, MD  TURMERIC PO Take by mouth daily.   Yes [provider]   Social History   Socioeconomic History  . Marital status: Widowed    Spouse name: Not on file  . Number of children: Not on file  . Years of education: Not on file  . Highest education level: Not on file  Occupational History  . Not on file  Social Needs  . Financial resource strain: Not on file  . Food insecurity    Worry: Not on file    Inability: Not on file  . Transportation needs    Medical: Not on file    Non-medical: Not on file  Tobacco Use  . Smoking status: Never Smoker  . Smokeless tobacco: Never Used  Substance and Sexual Activity  . Alcohol use: No  . Drug use: No  . Sexual activity: Yes    Birth control/protection: None    Comment: SEX PARTNERS IN THE LAST 12 MONTHS 1 AND CURRENT BIRTH CONTROL - NOT NEEDED  Lifestyle  . Physical activity    Days per week: Not on file     Minutes per session: Not on file  . Stress: Not on file  Relationships  . Social Herbalist on phone: Not on file    Gets together: Not on file    Attends religious service: Not on file    Active member of club  or organization: Not on file    Attends meetings of clubs or organizations: Not on file    Relationship status: Not on file  . Intimate partner violence    Fear of current or ex partner: Not on file    Emotionally abused: Not on file    Physically abused: Not on file    Forced sexual activity: Not on file  Other Topics Concern  . Not on file  Social History Narrative   EXERCISE WALKING AND WEIGHT TRAINING 5-6 DAYS/WEEK FOR 55 MINUTES   His wife died 2017/03/14    Review of Systems 13 point review of systems per patient health survey noted.  Negative other than as indicated above or in HPI.      Objective:   Physical Exam Vitals signs reviewed.  Constitutional:      Appearance: He is well-developed.  HENT:     Head: Normocephalic and atraumatic.     Right Ear: External ear normal.     Left Ear: External ear normal.  Eyes:     Conjunctiva/sclera: Conjunctivae normal.     Pupils: Pupils are equal, round, and reactive to light.  Neck:     Musculoskeletal: Normal range of motion and neck supple.     Thyroid: No thyromegaly.  Cardiovascular:     Rate and Rhythm: Normal rate and regular rhythm.     Heart sounds: Normal heart sounds.  Pulmonary:     Effort: Pulmonary effort is normal. No respiratory distress.     Breath sounds: Normal breath sounds. No wheezing.  Abdominal:     General: There is no distension.     Palpations: Abdomen is soft.     Tenderness: There is no abdominal tenderness.     Hernia: There is no hernia in the left inguinal area.  Genitourinary:    Prostate: Normal.  Musculoskeletal: Normal range of motion.        General: No tenderness.  Lymphadenopathy:     Cervical: No cervical adenopathy.  Skin:    General: Skin is warm and  dry.  Neurological:     Mental Status: He is alert and oriented to person, place, and time.     Deep Tendon Reflexes: Reflexes are normal and symmetric.  Psychiatric:        Behavior: Behavior normal.    Vitals:   01/18/19 1003  BP: 139/76  Pulse: 65  Resp: 14  Temp: 98.6 F (37 C)  TempSrc: Oral  SpO2: 97%  Weight: 165 lb 9.6 oz (75.1 kg)      Assessment & Plan:    Rodney Ebeling. is a 68 y.o. male Medicare annual wellness visit, subsequent  - - anticipatory guidance as below in AVS, screening labs if needed. Health maintenance items as above in HPI discussed/recommended as applicable.  - no concerning responses on depression, fall, or functional status screening. Any positive responses noted as above. Advanced directives discussed as in CHL.   Hyperlipidemia, unspecified hyperlipidemia type - Plan: Lipid panel  -Repeat labs with option of statin.  Unsure which one he took previously with some myalgias, could start low-dose initially to determine tolerability with co-Q10 supplement.  Elevated serum creatinine - Plan: Comprehensive metabolic panel  -Recommended decreased use of NSAID, try Tylenol for his orthopedic issues if possible.  Recheck creatinine, maintain hydration.  Insomnia, unspecified type - Plan: traZODone (DESYREL) 100 MG tablet  -Stable with 50 to 100 mg nightly.Marland Kitchen  Refilled.  Need for shingles vaccine - Plan: Zoster  Vaccine Adjuvanted [facility]) injection sent to pharmacy  Screening for malignant neoplasm of prostate - Plan: PSA  -Normal last year but slight increase from 2017.  We discussed pros and cons of prostate cancer screening, and after this discussion, he chose to have screening done. PSA obtained, and no concerning findings on DRE.    Meds ordered this encounter  Medications  . Zoster Vaccine Adjuvanted [facility]) injection    Sig: Inject 0.5 mLs into the muscle once for 1 dose. Repeat in 2-6 months.    Dispense:  0.5 mL    Refill:  1  .  traZODone (DESYREL) 100 MG tablet    Sig: TAKE HALF TO ONE TABLET BY MOUTH AT BEDTIME AS NEEDED FOR SLEEP    Dispense:  90 tablet    Refill:  3   Patient Instructions    Try tylenol over the counter if possible in place of nsaids such as alleve, as kidney test elevated last year. Drink plenty of fluids.   I will recheck the kidney function test as well as cholesterol test that were elevated last year.  Prostate test was normal last year but will recheck that as well today.  Follow-up with orthopedics as planned for your hip pain.  If that worsens, or other treatment needed prior to that appointment please follow-up and I am happy to see you as well.  Thank you for coming in today and take care.    Preventive Care 66 Years and Older, Male Preventive care refers to lifestyle choices and visits with your health care provider that can promote health and wellness. This includes:  A yearly physical exam. This is also called an annual well check.  Regular dental and eye exams.  Immunizations.  Screening for certain conditions.  Healthy lifestyle choices, such as diet and exercise. What can I expect for my preventive care visit? Physical exam Your health care provider will check:  Height and weight. These may be used to calculate body mass index (BMI), which is a measurement that tells if you are at a healthy weight.  Heart rate and blood pressure.  Your skin for abnormal spots. Counseling Your health care provider may ask you questions about:  Alcohol, tobacco, and drug use.  Emotional well-being.  Home and relationship well-being.  Sexual activity.  Eating habits.  History of falls.  Memory and ability to understand (cognition).  Work and work Statistician. What immunizations do I need?  Influenza (flu) vaccine  This is recommended every year. Tetanus, diphtheria, and pertussis (Tdap) vaccine  You may need a Td booster every 10 years. Varicella (chickenpox)  vaccine  You may need this vaccine if you have not already been vaccinated. Zoster (shingles) vaccine  You may need this after age 76. Pneumococcal conjugate (PCV13) vaccine  One dose is recommended after age 30. Pneumococcal polysaccharide (PPSV23) vaccine  One dose is recommended after age 3. Measles, mumps, and rubella (MMR) vaccine  You may need at least one dose of MMR if you were born in 1957 or later. You may also need a second dose. Meningococcal conjugate (MenACWY) vaccine  You may need this if you have certain conditions. Hepatitis A vaccine  You may need this if you have certain conditions or if you travel or work in places where you may be exposed to hepatitis A. Hepatitis B vaccine  You may need this if you have certain conditions or if you travel or work in places where you may be exposed to hepatitis B.  Haemophilus influenzae type b (Hib) vaccine  You may need this if you have certain conditions. You may receive vaccines as individual doses or as more than one vaccine together in one shot (combination vaccines). Talk with your health care provider about the risks and benefits of combination vaccines. What tests do I need? Blood tests  Lipid and cholesterol levels. These may be checked every 5 years, or more frequently depending on your overall health.  Hepatitis C test.  Hepatitis B test. Screening  Lung cancer screening. You may have this screening every year starting at age 29 if you have a 30-pack-year history of smoking and currently smoke or have quit within the past 15 years.  Colorectal cancer screening. All adults should have this screening starting at age 29 and continuing until age 52. Your health care provider may recommend screening at age 84 if you are at increased risk. You will have tests every 1-10 years, depending on your results and the type of screening test.  Prostate cancer screening. Recommendations will vary depending on your family  history and other risks.  Diabetes screening. This is done by checking your blood sugar (glucose) after you have not eaten for a while (fasting). You may have this done every 1-3 years.  Abdominal aortic aneurysm (AAA) screening. You may need this if you are a current or former smoker.  Sexually transmitted disease (STD) testing. Follow these instructions at home: Eating and drinking  Eat a diet that includes fresh fruits and vegetables, whole grains, lean protein, and low-fat dairy products. Limit your intake of foods with high amounts of sugar, saturated fats, and salt.  Take vitamin and mineral supplements as recommended by your health care provider.  Do not drink alcohol if your health care provider tells you not to drink.  If you drink alcohol: ? Limit how much you have to 0-2 drinks a day. ? Be aware of how much alcohol is in your drink. In the [place] one drink equals one 12 oz bottle of beer (355 mL), one 5 oz glass of wine (148 mL), or one 1 oz glass of hard liquor (44 mL). Lifestyle  Take daily care of your teeth and gums.  Stay active. Exercise for at least 30 minutes on 5 or more days each week.  Do not use any products that contain nicotine or tobacco, such as cigarettes, e-cigarettes, and chewing tobacco. If you need help quitting, ask your health care provider.  If you are sexually active, practice safe sex. Use a condom or other form of protection to prevent STIs (sexually transmitted infections).  Talk with your health care provider about taking a low-dose aspirin or statin. What's next?  Visit your health care provider once a year for a well check visit.  Ask your health care provider how often you should have your eyes and teeth checked.  Stay up to date on all vaccines. This information is not intended to replace advice given to you by your health care provider. Make sure you discuss any questions you have with your health care provider. Document Released:  06/19/2015 Document Revised: 05/17/2018 Document Reviewed: 05/17/2018 Elsevier Patient Education  [employer].    If you have lab work done today you will be contacted with your lab results within the next 2 weeks.  If you have not heard from [place] then please contact us. The fastest way to get your results is to register for My Chart.   IF you received an x-ray today,  you will receive an invoice from [facility]. Please contact [facility] at [phone] with questions or concerns regarding your invoice.   IF you received labwork today, you will receive an invoice from [facility]. Please contact [facility] at [phone] with questions or concerns regarding your invoice.   Our billing staff will not be able to assist you with questions regarding bills from these companies.  You will be contacted with the lab results as soon as they are available. The fastest way to get your results is to activate your My Chart account. Instructions are located on the last page of this paperwork. If you have not heard from [place] regarding the results in 2 weeks, please contact this office.       Signed,   Merri Ray, MD [facility].  01/18/19 12:42 PM

## 2019-01-18 NOTE — Patient Instructions (Addendum)
Try tylenol over the counter if possible in place of nsaids such as alleve, as kidney test elevated last year. Drink plenty of fluids.   I will recheck the kidney function test as well as cholesterol test that were elevated last year.  Prostate test was normal last year but will recheck that as well today.  Follow-up with orthopedics as planned for your hip pain.  If that worsens, or other treatment needed prior to that appointment please follow-up and I am happy to see you as well.  Thank you for coming in today and take care.    Preventive Care 67 Years and Older, Male Preventive care refers to lifestyle choices and visits with your health care provider that can promote health and wellness. This includes:  A yearly physical exam. This is also called an annual well check.  Regular dental and eye exams.  Immunizations.  Screening for certain conditions.  Healthy lifestyle choices, such as diet and exercise. What can I expect for my preventive care visit? Physical exam Your health care provider will check:  Height and weight. These may be used to calculate body mass index (BMI), which is a measurement that tells if you are at a healthy weight.  Heart rate and blood pressure.  Your skin for abnormal spots. Counseling Your health care provider may ask you questions about:  Alcohol, tobacco, and drug use.  Emotional well-being.  Home and relationship well-being.  Sexual activity.  Eating habits.  History of falls.  Memory and ability to understand (cognition).  Work and work Statistician. What immunizations do I need?  Influenza (flu) vaccine  This is recommended every year. Tetanus, diphtheria, and pertussis (Tdap) vaccine  You may need a Td booster every 10 years. Varicella (chickenpox) vaccine  You may need this vaccine if you have not already been vaccinated. Zoster (shingles) vaccine  You may need this after age 2. Pneumococcal conjugate (PCV13)  vaccine  One dose is recommended after age 30. Pneumococcal polysaccharide (PPSV23) vaccine  One dose is recommended after age 81. Measles, mumps, and rubella (MMR) vaccine  You may need at least one dose of MMR if you were born in 1957 or later. You may also need a second dose. Meningococcal conjugate (MenACWY) vaccine  You may need this if you have certain conditions. Hepatitis A vaccine  You may need this if you have certain conditions or if you travel or work in places where you may be exposed to hepatitis A. Hepatitis B vaccine  You may need this if you have certain conditions or if you travel or work in places where you may be exposed to hepatitis B. Haemophilus influenzae type b (Hib) vaccine  You may need this if you have certain conditions. You may receive vaccines as individual doses or as more than one vaccine together in one shot (combination vaccines). Talk with your health care provider about the risks and benefits of combination vaccines. What tests do I need? Blood tests  Lipid and cholesterol levels. These may be checked every 5 years, or more frequently depending on your overall health.  Hepatitis C test.  Hepatitis B test. Screening  Lung cancer screening. You may have this screening every year starting at age 52 if you have a 30-pack-year history of smoking and currently smoke or have quit within the past 15 years.  Colorectal cancer screening. All adults should have this screening starting at age 83 and continuing until age 15. Your health care provider may recommend screening at  age 40 if you are at increased risk. You will have tests every 1-10 years, depending on your results and the type of screening test.  Prostate cancer screening. Recommendations will vary depending on your family history and other risks.  Diabetes screening. This is done by checking your blood sugar (glucose) after you have not eaten for a while (fasting). You may have this done  every 1-3 years.  Abdominal aortic aneurysm (AAA) screening. You may need this if you are a current or former smoker.  Sexually transmitted disease (STD) testing. Follow these instructions at home: Eating and drinking  Eat a diet that includes fresh fruits and vegetables, whole grains, lean protein, and low-fat dairy products. Limit your intake of foods with high amounts of sugar, saturated fats, and salt.  Take vitamin and mineral supplements as recommended by your health care provider.  Do not drink alcohol if your health care provider tells you not to drink.  If you drink alcohol: ? Limit how much you have to 0-2 drinks a day. ? Be aware of how much alcohol is in your drink. In the [place] one drink equals one 12 oz bottle of beer (355 mL), one 5 oz glass of wine (148 mL), or one 1 oz glass of hard liquor (44 mL). Lifestyle  Take daily care of your teeth and gums.  Stay active. Exercise for at least 30 minutes on 5 or more days each week.  Do not use any products that contain nicotine or tobacco, such as cigarettes, e-cigarettes, and chewing tobacco. If you need help quitting, ask your health care provider.  If you are sexually active, practice safe sex. Use a condom or other form of protection to prevent STIs (sexually transmitted infections).  Talk with your health care provider about taking a low-dose aspirin or statin. What's next?  Visit your health care provider once a year for a well check visit.  Ask your health care provider how often you should have your eyes and teeth checked.  Stay up to date on all vaccines. This information is not intended to replace advice given to you by your health care provider. Make sure you discuss any questions you have with your health care provider. Document Released: 06/19/2015 Document Revised: 05/17/2018 Document Reviewed: 05/17/2018 Elsevier Patient Education  [employer].    If you have lab work done today you will be  contacted with your lab results within the next 2 weeks.  If you have not heard from [place] then please contact us. The fastest way to get your results is to register for My Chart.   IF you received an x-ray today, you will receive an invoice from [facility]. Please contact [facility] at [phone] with questions or concerns regarding your invoice.   IF you received labwork today, you will receive an invoice from [place]. Please contact [facility] at [phone] with questions or concerns regarding your invoice.   Our billing staff will not be able to assist you with questions regarding bills from these companies.  You will be contacted with the lab results as soon as they are available. The fastest way to get your results is to activate your My Chart account. Instructions are located on the last page of this paperwork. If you have not heard from [place] regarding the results in 2 weeks, please contact this office.

## 2019-01-19 DIAGNOSIS — R69 Illness, unspecified: Secondary | ICD-10-CM | POA: Diagnosis not present

## 2019-01-19 LAB — COMPREHENSIVE METABOLIC PANEL
ALT: 14 IU/L (ref 0–44)
AST: 23 IU/L (ref 0–40)
Albumin/Globulin Ratio: 2.2 (ref 1.2–2.2)
Albumin: 4.6 g/dL (ref 3.8–4.8)
Alkaline Phosphatase: 71 IU/L (ref 39–117)
BUN/Creatinine Ratio: 24 (ref 10–24)
BUN: 33 mg/dL — ABNORMAL HIGH (ref 8–27)
Bilirubin Total: 1.6 mg/dL — ABNORMAL HIGH (ref 0.0–1.2)
CO2: 23 mmol/L (ref 20–29)
Calcium: 9.6 mg/dL (ref 8.6–10.2)
Chloride: 100 mmol/L (ref 96–106)
Creatinine, Ser: 1.35 mg/dL — ABNORMAL HIGH (ref 0.76–1.27)
GFR calc Af Amer: 62 mL/min/{1.73_m2} (ref >59)
GFR calc non Af Amer: 54 mL/min/{1.73_m2} — ABNORMAL LOW (ref >59)
Globulin, Total: 2.1 g/dL (ref 1.5–4.5)
Glucose: 93 mg/dL (ref 65–99)
Potassium: 4.5 mmol/L (ref 3.5–5.2)
Sodium: 136 mmol/L (ref 134–144)
Total Protein: 6.7 g/dL (ref 6.0–8.5)

## 2019-01-19 LAB — PSA: Prostate Specific Ag, Serum: 2.7 ng/mL (ref 0.0–4.0)

## 2019-01-19 LAB — LIPID PANEL
Chol/HDL Ratio: 4.8 ratio (ref 0.0–5.0)
Cholesterol, Total: 231 mg/dL — ABNORMAL HIGH (ref 100–199)
HDL: 48 mg/dL (ref >39)
LDL Calculated: 168 mg/dL — ABNORMAL HIGH (ref 0–99)
Triglycerides: 74 mg/dL (ref 0–149)
VLDL Cholesterol Cal: 15 mg/dL (ref 5–40)

## 2019-01-28 ENCOUNTER — Encounter: Payer: Self-pay | Admitting: Radiology

## 2019-01-28 DIAGNOSIS — R69 Illness, unspecified: Secondary | ICD-10-CM | POA: Diagnosis not present

## 2019-02-08 ENCOUNTER — Telehealth: Payer: Self-pay | Admitting: Family Medicine

## 2019-02-08 NOTE — Telephone Encounter (Signed)
Pt calling in response to a letter he received. Pt has taken Crestor in the past - pt wants to use something different from that.

## 2019-02-08 NOTE — Telephone Encounter (Signed)
This pt states he too Crestor so would like something different. Please advise

## 2019-02-12 MED ORDER — ATORVASTATIN CALCIUM 10 MG PO TABS: 10.0000 mg | ORAL_TABLET | Freq: Every day | ORAL | 0 refills | Status: DC

## 2019-02-12 NOTE — Telephone Encounter (Signed)
Pt informed of new Rx sent to pharmacy, he verbalized understanding.

## 2019-02-12 NOTE — Telephone Encounter (Signed)
lipitor rx sent- can start every other day initially to see if tolerated. Let me know if other questions.

## 2019-02-22 DIAGNOSIS — M1612 Unilateral primary osteoarthritis, left hip: Secondary | ICD-10-CM | POA: Diagnosis not present

## 2019-02-22 DIAGNOSIS — M25552 Pain in left hip: Secondary | ICD-10-CM | POA: Insufficient documentation

## 2019-02-28 ENCOUNTER — Telehealth: Payer: Self-pay | Admitting: Family Medicine

## 2019-02-28 NOTE — Telephone Encounter (Signed)
preop eval form received.  It looks like that surgery is scheduled December 23rd.  Please schedule for preoperative evaluation in November unless he plans on having surgery sooner. Thanks. -JG

## 2019-03-04 NOTE — Telephone Encounter (Signed)
Spoke with pt and scheduled appt °

## 2019-05-03 ENCOUNTER — Ambulatory Visit (INDEPENDENT_AMBULATORY_CARE_PROVIDER_SITE_OTHER): Payer: Medicare HMO | Admitting: Family Medicine

## 2019-05-03 ENCOUNTER — Other Ambulatory Visit: Payer: Self-pay

## 2019-05-03 ENCOUNTER — Encounter: Payer: Self-pay | Admitting: Family Medicine

## 2019-05-03 VITALS — BP 133/86 | HR 78 | Temp 98.9°F | Wt 168.2 lb

## 2019-05-03 DIAGNOSIS — Z131 Encounter for screening for diabetes mellitus: Secondary | ICD-10-CM

## 2019-05-03 DIAGNOSIS — R7989 Other specified abnormal findings of blood chemistry: Secondary | ICD-10-CM

## 2019-05-03 DIAGNOSIS — E785 Hyperlipidemia, unspecified: Secondary | ICD-10-CM

## 2019-05-03 DIAGNOSIS — Z01818 Encounter for other preprocedural examination: Secondary | ICD-10-CM

## 2019-05-03 DIAGNOSIS — Z136 Encounter for screening for cardiovascular disorders: Secondary | ICD-10-CM

## 2019-05-03 LAB — POCT URINALYSIS DIP (MANUAL ENTRY)
Bilirubin, UA: NEGATIVE
Glucose, UA: NEGATIVE mg/dL
Ketones, POC UA: NEGATIVE mg/dL
Leukocytes, UA: NEGATIVE
Nitrite, UA: NEGATIVE
Protein Ur, POC: NEGATIVE mg/dL
Spec Grav, UA: 1.025 (ref 1.010–1.025)
Urobilinogen, UA: 0.2 E.U./dL
pH, UA: 5 (ref 5.0–8.0)

## 2019-05-03 NOTE — Progress Notes (Signed)
Subjective:  Patient ID: Rodney Daub., male    DOB: [birth year]  Age: 68 y.o. MRN: [id number]  CC:  Chief Complaint  Patient presents with  . surgical clearance    surgery on left hip on May 29, 2019 hip replacement    HPI Rodney Bailey. presents for   Surgical clearance.  Plans for left hip replacement on December 23rd, Dr. Wynelle Link. intermediate risk surgery.  History of hyperlipidemia, takes Lipitor, trazodone for insomnia.  Borderline creatinine,  improved August 14 from previous readings.  Anesthesia with tooth extraction? Tolerated without difficulty. No med allergies.   No history of MI/CHF/arythmia.  No chest pain with exertion, no PND, no DOE.  Exercise bike 63mins - no CP/DOE. L Hip limiting exercise.    RCRI/Goldman index: 0  No hx of OSA known.  Nonsmoker.    Hyperlipidemia:  Lab Results  Component Value Date   CHOL 231 (H) 01/18/2019   HDL 48 01/18/2019   LDLCALC 168 (H) 01/18/2019   TRIG 74 01/18/2019   CHOLHDL 4.8 01/18/2019   Lab Results  Component Value Date   ALT 14 01/18/2019   AST 23 01/18/2019   ALKPHOS 71 01/18/2019   BILITOT 1.6 (H) 01/18/2019   lipitor 1/2 pill per day - full pill caused myalgias. Krill oil as well.   Elevated creatinine: Lab Results  Component Value Date   CREATININE 1.35 (H) 01/18/2019   cut back on naprosyn - more tylenol now. Turmeric for inflammation.    History Patient Active Problem List   Diagnosis Date Noted  . Need for influenza vaccination 03/28/2017  . Injury of toe on right foot 03/28/2017  . Subungual hematoma of great toe of right foot 03/28/2017  . Insomnia 11/02/2016  . Nocturia 11/03/2015  . Hematuria 04/26/2014   Past Medical History:  Diagnosis Date  . Allergy   . Arthritis    No past surgical history on file. No Known Allergies Prior to Admission medications   Medication Sig Start Date End Date Taking? Authorizing Provider  atorvastatin (LIPITOR) 10 MG tablet Take 1 tablet  (10 mg total) by mouth daily. Can initially start QOD to make sure tolerated. 02/12/19  Yes Wendie Agreste, MD  azelastine (ASTELIN) 137 MCG/SPRAY nasal spray Place 1 spray into the nose 2 (two) times daily. Use in each nostril as directed 09/11/12  Yes Dunn, Areta Haber, PA-C  GLUCOSAMINE PO Take by mouth daily.   Yes [provider]  Melatonin 2.5 MG CAPS Take by mouth at bedtime.   Yes [provider]  naproxen (NAPROSYN) 250 MG tablet Take 440 mg by mouth 2 (two) times daily with a meal.    Yes [provider]  saw palmetto 160 MG capsule Take 160 mg by mouth 2 (two) times daily.   Yes [provider]  traZODone (DESYREL) 100 MG tablet TAKE HALF TO ONE TABLET BY MOUTH AT BEDTIME AS NEEDED FOR SLEEP 01/18/19  Yes Wendie Agreste, MD  TURMERIC PO Take by mouth daily.   Yes [provider]   Social History   Socioeconomic History  . Marital status: Widowed    Spouse name: Not on file  . Number of children: Not on file  . Years of education: Not on file  . Highest education level: Not on file  Occupational History  . Not on file  Social Needs  . Financial resource strain: Not on file  . Food insecurity    Worry: Not  on file    Inability: Not on file  . Transportation needs    Medical: Not on file    Non-medical: Not on file  Tobacco Use  . Smoking status: Never Smoker  . Smokeless tobacco: Never Used  Substance and Sexual Activity  . Alcohol use: No  . Drug use: No  . Sexual activity: Yes    Birth control/protection: None    Comment: SEX PARTNERS IN THE LAST 12 MONTHS 1 AND CURRENT BIRTH CONTROL - NOT NEEDED  Lifestyle  . Physical activity    Days per week: Not on file    Minutes per session: Not on file  . Stress: Not on file  Relationships  . Social Herbalist on phone: Not on file    Gets together: Not on file    Attends religious service: Not on file    Active member of club or organization: Not on file    Attends  meetings of clubs or organizations: Not on file    Relationship status: Not on file  . Intimate partner violence    Fear of current or ex partner: Not on file    Emotionally abused: Not on file    Physically abused: Not on file    Forced sexual activity: Not on file  Other Topics Concern  . Not on file  Social History Narrative   EXERCISE WALKING AND WEIGHT TRAINING 5-6 DAYS/WEEK FOR 30 MINUTES   His wife died 03-15-17    Review of Systems   Objective:   Vitals:   05/03/19 0818  BP: 133/86  Pulse: 78  Temp: 98.9 F (37.2 C)  TempSrc: Oral  SpO2: 98%  Weight: 168 lb 3.2 oz (76.3 kg)     Physical Exam Vitals signs reviewed.  Constitutional:      Appearance: He is well-developed.  HENT:     Head: Normocephalic and atraumatic.  Eyes:     Pupils: Pupils are equal, round, and reactive to light.  Neck:     Vascular: No carotid bruit or JVD.  Cardiovascular:     Rate and Rhythm: Normal rate and regular rhythm.     Heart sounds: Normal heart sounds. No murmur. No gallop.   Pulmonary:     Effort: Pulmonary effort is normal.     Breath sounds: Normal breath sounds. No rales.  Musculoskeletal:     Right lower leg: No edema.     Left lower leg: No edema.  Skin:    General: Skin is warm and dry.  Neurological:     Mental Status: He is alert and oriented to person, place, and time.  Psychiatric:        Mood and Affect: Mood normal.        Behavior: Behavior normal.    EKG: SR, no acute findings.  Results for orders placed or performed in visit on 05/03/19  POCT urinalysis dipstick  Result Value Ref Range   Color, UA yellow yellow   Clarity, UA clear clear   Glucose, UA negative negative mg/dL   Bilirubin, UA negative negative   Ketones, POC UA negative negative mg/dL   Spec Grav, UA 1.025 1.010 - 1.025   Blood, UA small (A) negative   pH, UA 5.0 5.0 - 8.0   Protein Ur, POC negative negative mg/dL   Urobilinogen, UA 0.2 0.2 or 1.0 E.U./dL   Nitrite, UA  Negative Negative   Leukocytes, UA Negative Negative     Assessment & Plan:  Rodney Bailey. is a 68 y.o. male . Preoperative clearance - Plan: Comprehensive metabolic panel, EKG [id number], CBC, Hemoglobin A1c, POCT urinalysis dipstick, DG Chest 2 View, CANCELED: DG Chest 2 View  Hyperlipidemia, unspecified hyperlipidemia type - Plan: Lipid panel  Elevated serum creatinine  Screening for cardiovascular condition  Screening for diabetes mellitus - Plan: Hemoglobin A1c  Tolerating lower dose Lipitor.  Continue same, repeat labs. Preoperative clearance performed without apparent increased risk for major adverse cardiac event, is able to achieve at least 4 metabolic equivalents of activity, and activity limited primarily by hip pain.  EKG without acute findings.  Chest x-ray ordere,  other labs as above ordered.  Anticipate clearance once lab work reviewed.  No orders of the defined types were placed in this encounter.  Patient Instructions   Once I have results from labs, can send in form.  Xray ordered for [facility].  No change in cholesterol med for now  Let me know if there are questions and take care.    If you have lab work done today you will be contacted with your lab results within the next 2 weeks.  If you have not heard from [place] then please contact us. The fastest way to get your results is to register for My Chart.   IF you received an x-ray today, you will receive an invoice from [facility]. Please contact [facility] at [phone] with questions or concerns regarding your invoice.   IF you received labwork today, you will receive an invoice from [place]. Please contact [facility] at [phone] with questions or concerns regarding your invoice.   Our billing staff will not be able to assist you with questions regarding bills from these companies.  You will be contacted with the lab results as soon as they are available. The fastest way  to get your results is to activate your My Chart account. Instructions are located on the last page of this paperwork. If you have not heard from [place] regarding the results in 2 weeks, please contact this office.    \     Signed, Merri Ray, MD [facility]

## 2019-05-03 NOTE — Patient Instructions (Addendum)
Once I have results from labs, can send in form.  Xray ordered for [facility].  No change in cholesterol med for now  Let me know if there are questions and take care.    If you have lab work done today you will be contacted with your lab results within the next 2 weeks.  If you have not heard from [place] then please contact us. The fastest way to get your results is to register for My Chart.   IF you received an x-ray today, you will receive an invoice from [facility]. Please contact [facility] at [phone] with questions or concerns regarding your invoice.   IF you received labwork today, you will receive an invoice from [place]. Please contact [facility] at [phone] with questions or concerns regarding your invoice.   Our billing staff will not be able to assist you with questions regarding bills from these companies.  You will be contacted with the lab results as soon as they are available. The fastest way to get your results is to activate your My Chart account. Instructions are located on the last page of this paperwork. If you have not heard from [place] regarding the results in 2 weeks, please contact this office.    \

## 2019-05-04 LAB — COMPREHENSIVE METABOLIC PANEL
ALT: 24 IU/L (ref 0–44)
AST: 22 IU/L (ref 0–40)
Albumin/Globulin Ratio: 1.5 (ref 1.2–2.2)
Albumin: 3.9 g/dL (ref 3.8–4.8)
Alkaline Phosphatase: 87 IU/L (ref 39–117)
BUN/Creatinine Ratio: 18 (ref 10–24)
BUN: 25 mg/dL (ref 8–27)
Bilirubin Total: 1.1 mg/dL (ref 0.0–1.2)
CO2: 21 mmol/L (ref 20–29)
Calcium: 9.6 mg/dL (ref 8.6–10.2)
Chloride: 105 mmol/L (ref 96–106)
Creatinine, Ser: 1.36 mg/dL — ABNORMAL HIGH (ref 0.76–1.27)
GFR calc Af Amer: 62 mL/min/{1.73_m2} (ref >59)
GFR calc non Af Amer: 53 mL/min/{1.73_m2} — ABNORMAL LOW (ref >59)
Globulin, Total: 2.6 g/dL (ref 1.5–4.5)
Glucose: 92 mg/dL (ref 65–99)
Potassium: 4.8 mmol/L (ref 3.5–5.2)
Sodium: 141 mmol/L (ref 134–144)
Total Protein: 6.5 g/dL (ref 6.0–8.5)

## 2019-05-04 LAB — CBC
Hematocrit: 45.4 % (ref 37.5–51.0)
Hemoglobin: 15 g/dL (ref 13.0–17.7)
MCH: 31.3 pg (ref 26.6–33.0)
MCHC: 33 g/dL (ref 31.5–35.7)
MCV: 95 fL (ref 79–97)
Platelets: 236 10*3/uL (ref 150–450)
RBC: 4.79 x10E6/uL (ref 4.14–5.80)
RDW: 12.2 % (ref 11.6–15.4)
WBC: 5.5 10*3/uL (ref 3.4–10.8)

## 2019-05-04 LAB — LIPID PANEL
Chol/HDL Ratio: 3.7 ratio (ref 0.0–5.0)
Cholesterol, Total: 183 mg/dL (ref 100–199)
HDL: 49 mg/dL (ref >39)
LDL Chol Calc (NIH): 124 mg/dL — ABNORMAL HIGH (ref 0–99)
Triglycerides: 51 mg/dL (ref 0–149)
VLDL Cholesterol Cal: 10 mg/dL (ref 5–40)

## 2019-05-04 LAB — HEMOGLOBIN A1C
Est. average glucose Bld gHb Est-mCnc: 111 mg/dL
Hgb A1c MFr Bld: 5.5 % (ref 4.8–5.6)

## 2019-05-06 ENCOUNTER — Ambulatory Visit
Admission: RE | Admit: 2019-05-06 | Discharge: 2019-05-06 | Disposition: A | Discharge status: Home / Self Care | Payer: Medicare HMO | Source: Ambulatory Visit | Attending: Family Medicine | Admitting: Family Medicine

## 2019-05-06 ENCOUNTER — Other Ambulatory Visit: Payer: Self-pay

## 2019-05-06 DIAGNOSIS — Z01818 Encounter for other preprocedural examination: Secondary | ICD-10-CM

## 2019-05-23 NOTE — Patient Instructions (Addendum)
DUE TO COVID-19 ONLY ONE VISITOR IS ALLOWED TO COME WITH YOU AND STAY IN THE WAITING ROOM ONLY DURING PRE OP AND PROCEDURE DAY OF SURGERY. THE 1 VISITOR MAY VISIT WITH YOU AFTER SURGERY IN YOUR PRIVATE ROOM DURING VISITING HOURS ONLY!  YOU NEED TO HAVE A COVID 19 TEST ON: 05/25/2019 @ 9:45    , THIS TEST MUST BE DONE BEFORE SURGERY, COME  [place] [employer] [place] [employer] , [id number].  (Tea) ONCE YOUR COVID TEST IS COMPLETED, PLEASE BEGIN THE QUARANTINE INSTRUCTIONS AS OUTLINED IN YOUR HANDOUT.                [place].    Your procedure is scheduled on: 05/29/2019   Report to [facility] Main  Entrance   Report to short stay at: 5:30  AM     Call this number if you have problems the morning of surgery [phone]    You may not have any metal on your body including hair pins and              piercings  Do not wear jewelry, lotions, powders or perfumes, deodorant            Men may shave face and neck.   Do not bring valuables to the hospital. [place].  Contacts, dentures or bridgework may not be worn into surgery.  Leave suitcase in the car. After surgery it may be brought to your room.     Patients discharged the day of surgery will not be allowed to drive home. IF YOU ARE HAVING SURGERY AND GOING HOME THE SAME DAY, YOU MUST HAVE AN ADULT TO DRIVE YOU HOME AND BE WITH YOU FOR 24 HOURS. YOU MAY GO HOME BY TAXI OR UBER OR ORTHERWISE, BUT AN ADULT MUST ACCOMPANY YOU HOME AND STAY WITH YOU FOR 24 HOURS.  Name and phone number of your driver:  Special Instructions: [name]              Please read over the following fact sheets you were given: _____________________________________________________________________       NO SOLID FOOD AFTER MIDNIGHT THE NIGHT PRIOR TO SURGERY. NOTHING BY MOUTH EXCEPT CLEAR LIQUIDS UNTIL : 4:30 AM. PLEASE FINISH ENSURE DRINK PER SURGEON ORDER  WHICH NEEDS TO BE COMPLETED AT  : 4:30 AM   CLEAR LIQUID DIET   Foods Allowed                                                                     Foods Excluded  Coffee and tea, regular and decaf                             liquids that you cannot  Plain Jell-O any favor except red or purple                                           see through such as: Fruit ices (not with  fruit pulp)                                     milk, soups, orange juice  Iced Popsicles                                    All solid food Carbonated beverages, regular and diet                                    Cranberry, grape and apple juices Sports drinks like Gatorade Lightly seasoned clear broth or consume(fat free) Sugar, honey syrup  Sample Menu Breakfast                                Lunch                                     Supper Cranberry juice                    Beef broth                            Chicken broth Jell-O                                     Grape juice                           Apple juice Coffee or tea                        Jell-O                                      Popsicle                                                Coffee or tea                        Coffee or tea  _____________________________________________________________________   Remember:    Take these medicines the morning of surgery with A SIP OF WATER: Tylenol as needed.       BRUSH YOUR TEETH MORNING OF SURGERY AND RINSE YOUR MOUTH OUT, NO CHEWING GUM CANDY OR MINTS.         [place] - Preparing for Surgery Before surgery, you can play an important role.  Because skin is not sterile, your skin needs to be as free of germs as possible.  You can reduce the number of germs on your skin by washing with CHG (chlorahexidine gluconate) soap before surgery.  CHG is an antiseptic cleaner which kills germs and bonds with the skin to continue killing germs even after  washing. Please DO NOT use if you have an allergy to CHG or antibacterial  soaps.  If your skin becomes reddened/irritated stop using the CHG and inform your nurse when you arrive at Short Stay. Do not shave (including legs and underarms) for at least 48 hours prior to the first CHG shower.  You may shave your face/neck. Please follow these instructions carefully:  1.  Shower with CHG Soap the night before surgery and the  morning of Surgery.  2.  If you choose to wash your hair, wash your hair first as usual with your  normal  shampoo.  3.  After you shampoo, rinse your hair and body thoroughly to remove the  shampoo.                           4.  Use CHG as you would any other liquid soap.  You can apply chg directly  to the skin and wash                       Gently with a scrungie or clean washcloth.  5.  Apply the CHG Soap to your body ONLY FROM THE NECK DOWN.   Do not use on face/ open                           Wound or open sores. Avoid contact with eyes, ears mouth and genitals (private parts).                       Wash face,  Genitals (private parts) with your normal soap.             6.  Wash thoroughly, paying special attention to the area where your surgery  will be performed.  7.  Thoroughly rinse your body with warm water from the neck down.  8.  DO NOT shower/wash with your normal soap after using and rinsing off  the CHG Soap.                9.  Pat yourself dry with a clean towel.            10.  Wear clean pajamas.            11.  Place clean sheets on your bed the night of your first shower and do not  sleep with pets. Day of Surgery : Do not apply any lotions/deodorants the morning of surgery.  Please wear clean clothes to the hospital/surgery center.  FAILURE TO FOLLOW THESE INSTRUCTIONS MAY RESULT IN THE CANCELLATION OF YOUR SURGERY PATIENT SIGNATURE_________________________________  NURSE SIGNATURE__________________________________  ________________________________________________________________________   Adam Phenix  An  incentive spirometer is a tool that can help keep your lungs clear and active. This tool measures how well you are filling your lungs with each breath. Taking long deep breaths may help reverse or decrease the chance of developing breathing (pulmonary) problems (especially infection) following:  A long period of time when you are unable to move or be active. BEFORE THE PROCEDURE   If the spirometer includes an indicator to show your best effort, your nurse or respiratory therapist will set it to a desired goal.  If possible, sit up straight or lean slightly forward. Try not to slouch.  Hold the incentive spirometer in an upright position. INSTRUCTIONS FOR USE  1. Sit on the edge of  your bed if possible, or sit up as far as you can in bed or on a chair. 2. Hold the incentive spirometer in an upright position. 3. Breathe out normally. 4. Place the mouthpiece in your mouth and seal your lips tightly around it. 5. Breathe in slowly and as deeply as possible, raising the piston or the ball toward the top of the column. 6. Hold your breath for 3-5 seconds or for as long as possible. Allow the piston or ball to fall to the bottom of the column. 7. Remove the mouthpiece from your mouth and breathe out normally. 8. Rest for a few seconds and repeat Steps 1 through 7 at least 10 times every 1-2 hours when you are awake. Take your time and take a few normal breaths between deep breaths. 9. The spirometer may include an indicator to show your best effort. Use the indicator as a goal to work toward during each repetition. 10. After each set of 10 deep breaths, practice coughing to be sure your lungs are clear. If you have an incision (the cut made at the time of surgery), support your incision when coughing by placing a pillow or rolled up towels firmly against it. Once you are able to get out of bed, walk around indoors and cough well. You may stop using the incentive spirometer when instructed by your  caregiver.  RISKS AND COMPLICATIONS  Take your time so you do not get dizzy or light-headed.  If you are in pain, you may need to take or ask for pain medication before doing incentive spirometry. It is harder to take a deep breath if you are having pain. AFTER USE  Rest and breathe slowly and easily.  It can be helpful to keep track of a log of your progress. Your caregiver can provide you with a simple table to help with this. If you are using the spirometer at home, follow these instructions: [place] IF:   You are having difficultly using the spirometer.  You have trouble using the spirometer as often as instructed.  Your pain medication is not giving enough relief while using the spirometer.  You develop fever of 100.5 F (38.1 C) or higher. SEEK IMMEDIATE MEDICAL CARE IF:   You cough up bloody sputum that had not been present before.  You develop fever of 102 F (38.9 C) or greater.  You develop worsening pain at or near the incision site. MAKE SURE YOU:   Understand these instructions.  Will watch your condition.  Will get help right away if you are not doing well or get worse. Document Released: 10/03/2006 Document Revised: 08/15/2011 Document Reviewed: 12/04/2006 [facility] Patient Information 2014 [place], [place].   ________________________________________________________________________

## 2019-05-24 ENCOUNTER — Encounter (HOSPITAL_COMMUNITY)
Admission: RE | Admit: 2019-05-24 | Discharge: 2019-05-24 | Disposition: A | Discharge status: Home / Self Care | Payer: Medicare HMO | Source: Ambulatory Visit | Attending: Orthopedic Surgery | Admitting: Orthopedic Surgery

## 2019-05-24 ENCOUNTER — Encounter (HOSPITAL_COMMUNITY): Payer: Self-pay

## 2019-05-24 ENCOUNTER — Other Ambulatory Visit: Payer: Self-pay

## 2019-05-24 DIAGNOSIS — Z01812 Encounter for preprocedural laboratory examination: Secondary | ICD-10-CM | POA: Insufficient documentation

## 2019-05-24 NOTE — Progress Notes (Addendum)
PCP - Dr. Merri Ray. LOV/surgical clerance: 05/03/19. EPIC Cardiologist -   Chest x-ray -: 05/06/2019 EKG -  Stress Test -  ECHO -  Cardiac Cath -   Sleep Study -  CPAP -   Fasting Blood Sugar -  Checks Blood Sugar _____ times a day  Blood Thinner Instructions: Aspirin Instructions: Last Dose:  Anesthesia review:   Patient denies shortness of breath, fever, cough and chest pain at PAT appointment   Patient verbalized understanding of instructions that were given to them at the PAT appointment. Patient was also instructed that they will need to review over the PAT instructions again at home before surgery.

## 2019-05-25 ENCOUNTER — Other Ambulatory Visit (HOSPITAL_COMMUNITY)
Admission: RE | Admit: 2019-05-25 | Discharge: 2019-05-25 | Disposition: A | Discharge status: Home / Self Care | Payer: Medicare HMO | Source: Ambulatory Visit | Attending: Orthopedic Surgery | Admitting: Orthopedic Surgery

## 2019-05-25 DIAGNOSIS — Z01812 Encounter for preprocedural laboratory examination: Secondary | ICD-10-CM | POA: Insufficient documentation

## 2019-05-25 DIAGNOSIS — Z20828 Contact with and (suspected) exposure to other viral communicable diseases: Secondary | ICD-10-CM | POA: Diagnosis not present

## 2019-05-26 LAB — NOVEL CORONAVIRUS, NAA (HOSP ORDER, SEND-OUT TO REF LAB; TAT 18-24 HRS): SARS-CoV-2, NAA: NOT DETECTED

## 2019-05-27 ENCOUNTER — Encounter (HOSPITAL_COMMUNITY)
Admission: RE | Admit: 2019-05-27 | Discharge: 2019-05-27 | Disposition: A | Discharge status: Home / Self Care | Payer: Medicare HMO | Source: Ambulatory Visit | Attending: Orthopedic Surgery | Admitting: Orthopedic Surgery

## 2019-05-27 ENCOUNTER — Other Ambulatory Visit: Payer: Self-pay

## 2019-05-27 DIAGNOSIS — Z01812 Encounter for preprocedural laboratory examination: Secondary | ICD-10-CM | POA: Diagnosis not present

## 2019-05-27 LAB — COMPREHENSIVE METABOLIC PANEL
ALT: 20 U/L (ref 0–44)
AST: 23 U/L (ref 15–41)
Albumin: 4.1 g/dL (ref 3.5–5.0)
Alkaline Phosphatase: 60 U/L (ref 38–126)
Anion gap: 8 (ref 5–15)
BUN: 32 mg/dL — ABNORMAL HIGH (ref 8–23)
CO2: 27 mmol/L (ref 22–32)
Calcium: 9.5 mg/dL (ref 8.9–10.3)
Chloride: 106 mmol/L (ref 98–111)
Creatinine, Ser: 1.2 mg/dL (ref 0.61–1.24)
GFR calc Af Amer: >60 mL/min (ref >60)
GFR calc non Af Amer: >60 mL/min (ref >60)
Glucose, Bld: 96 mg/dL (ref 70–99)
Potassium: 4.3 mmol/L (ref 3.5–5.1)
Sodium: 141 mmol/L (ref 135–145)
Total Bilirubin: 2.1 mg/dL — ABNORMAL HIGH (ref 0.3–1.2)
Total Protein: 6.7 g/dL (ref 6.5–8.1)

## 2019-05-27 LAB — CBC WITH DIFFERENTIAL/PLATELET
Abs Immature Granulocytes: 0.06 10*3/uL (ref 0.00–0.07)
Basophils Absolute: 0 10*3/uL (ref 0.0–0.1)
Basophils Relative: 1 %
Eosinophils Absolute: 0.1 10*3/uL (ref 0.0–0.5)
Eosinophils Relative: 1 %
HCT: 47.9 % (ref 39.0–52.0)
Hemoglobin: 15.2 g/dL (ref 13.0–17.0)
Immature Granulocytes: 1 %
Lymphocytes Relative: 20 %
Lymphs Abs: 1.3 10*3/uL (ref 0.7–4.0)
MCH: 30.8 pg (ref 26.0–34.0)
MCHC: 31.7 g/dL (ref 30.0–36.0)
MCV: 97 fL (ref 80.0–100.0)
Monocytes Absolute: 0.6 10*3/uL (ref 0.1–1.0)
Monocytes Relative: 10 %
Neutro Abs: 4.3 10*3/uL (ref 1.7–7.7)
Neutrophils Relative %: 67 %
Platelets: 250 10*3/uL (ref 150–400)
RBC: 4.94 MIL/uL (ref 4.22–5.81)
RDW: 12.2 % (ref 11.5–15.5)
WBC: 6.3 10*3/uL (ref 4.0–10.5)
nRBC: 0 % (ref 0.0–0.2)

## 2019-05-27 LAB — SURGICAL PCR SCREEN
MRSA, PCR: NEGATIVE
Staphylococcus aureus: NEGATIVE

## 2019-05-27 LAB — APTT: aPTT: 41 seconds — ABNORMAL HIGH (ref 24–36)

## 2019-05-27 LAB — PROTIME-INR
INR: 1 (ref 0.8–1.2)
Prothrombin Time: 13.5 seconds (ref 11.4–15.2)

## 2019-05-27 LAB — ABO/RH: ABO/RH(D): O POS

## 2019-05-28 NOTE — H&P (Signed)
TOTAL HIP ADMISSION H&P  Patient is admitted for left total hip arthroplasty.  Subjective:  Chief Complaint: left hip pain  HPI: Rodney Bailey., 68 y.o. male, has a history of pain and functional disability in the left hip(s) due to arthritis and patient has failed non-surgical conservative treatments for greater than 12 weeks to include NSAID's and/or analgesics, flexibility and strengthening excercises and activity modification.  Onset of symptoms was gradual starting 3 years ago with gradually worsening course since that time.The patient noted no past surgery on the left hip(s).  Patient currently rates pain in the left hip at 8 out of 10 with activity. Patient has night pain, worsening of pain with activity and weight bearing, pain that interfers with activities of daily living and pain with passive range of motion. Patient has evidence of periarticular osteophytes and joint space narrowing by imaging studies. This condition presents safety issues increasing the risk of falls. There is no current active infection.  Patient Active Problem List   Diagnosis Date Noted  . Need for influenza vaccination 03/28/2017  . Injury of toe on right foot 03/28/2017  . Subungual hematoma of great toe of right foot 03/28/2017  . Insomnia 11/02/2016  . Nocturia 11/03/2015  . Hematuria 04/26/2014   Past Medical History:  Diagnosis Date  . Allergy   . Arthritis         Current Outpatient Medications  Medication Sig Dispense Refill Last Dose  . acetaminophen (TYLENOL) 500 MG tablet Take 500 mg by mouth every 6 (six) hours as needed for mild pain or moderate pain.     . Ascorbic Acid (VITAMIN C) 1000 MG tablet Take 1,000 mg by mouth daily.     Marland Kitchen atorvastatin (LIPITOR) 10 MG tablet Take 1 tablet (10 mg total) by mouth daily. Can initially start QOD to make sure tolerated. (Patient taking differently: Take 5 mg by mouth at bedtime. ) 90 tablet 0   . Benfotiamine 150 MG CAPS Take 150 mg by mouth  daily.     . Cholecalciferol (VITAMIN D-3) 125 MCG (5000 UT) TABS Take 5,000 Units by mouth daily.     Marland Kitchen GLUCOSAMINE PO Take 1,500 mg by mouth daily.      Marland Kitchen KRILL OIL PO Take 220 mg by mouth daily.     . Magnesium 400 MG TABS Take 400 mg by mouth 2 (two) times daily.     . Melatonin 5 MG TABS Take 5-10 mg by mouth at bedtime as needed (sleep).      Marland Kitchen OVER THE COUNTER MEDICATION Take 280 mg by mouth daily. Horsetail     . SAW PALMETTO, SERENOA REPENS, PO Take 800 mg by mouth daily.      . traZODone (DESYREL) 100 MG tablet TAKE HALF TO ONE TABLET BY MOUTH AT BEDTIME AS NEEDED FOR SLEEP (Patient taking differently: Take 100 mg by mouth at bedtime. ) 90 tablet 3   . TURMERIC PO Take 1,200 mg by mouth daily.      Marland Kitchen azelastine (ASTELIN) 137 MCG/SPRAY nasal spray Place 1 spray into the nose 2 (two) times daily. Use in each nostril as directed (Patient not taking: Reported on 05/21/2019) 30 mL 6 Not Taking at Unknown time   No Known Allergies  Social History   Tobacco Use  . Smoking status: Never Smoker  . Smokeless tobacco: Never Used  Substance Use Topics  . Alcohol use: No    Family History  Problem Relation Age of Onset  .  Heart disease Mother        CHF  . Stroke Father      Review of Systems  Constitutional: Negative.   HENT: Negative.   Eyes: Negative.   Respiratory: Negative.   Cardiovascular: Negative.   Gastrointestinal: Negative.   Endocrine: Negative.   Genitourinary: Negative.   Musculoskeletal: Positive for arthralgias, gait problem, joint swelling and neck pain. Negative for back pain, myalgias and neck stiffness.  Skin: Negative.   Allergic/Immunologic: Negative.   Neurological: Negative for dizziness, tremors, seizures, syncope, facial asymmetry, speech difficulty, weakness, light-headedness, numbness and headaches.  Hematological: Negative.   Psychiatric/Behavioral: Negative.     Objective:  Physical Exam  Constitutional: He is oriented to person, place, and  time. He appears well-developed. No distress.  HENT:  Head: Normocephalic and atraumatic.  Eyes: EOM are normal.  Cardiovascular: Normal rate, regular rhythm, normal heart sounds and intact distal pulses.  No murmur heard. Respiratory: Effort normal and breath sounds normal. No respiratory distress. He has no wheezes.  GI: He exhibits no distension. There is no abdominal tenderness.  Musculoskeletal:     Cervical back: Normal range of motion.     Comments: Significantly antalgic gait pattern favoring the left side without the use of assisted devices.  Right Hip Exam: ROM: Flexion to 110, Internal Rotation 20, External Rotation 30, and Abduction 30 without discomfort. There is no tenderness over the greater trochanter bursa.  Left Hip Exam: ROM: Flexion to 90, Internal Rotation 0, External Rotation 0, and Abduction 0 degrees with discomfort. There is no tenderness over the greater trochanter bursa.  Neurological: He is alert and oriented to person, place, and time. He has normal strength. No sensory deficit.  Skin: No rash noted. He is not diaphoretic. No erythema.  Psychiatric: He has a normal mood and affect. His behavior is normal.    Ht: 5 ft 6 in  Wt: 166.8 lbs  BMI: 26.9  BP: 142/80 sitting L arm  Pulse: 76 bpm regular   Imaging Review Plain radiographs demonstrate severe degenerative joint disease of the left hip(s). The bone quality appears to be good for age and reported activity level.    Assessment/Plan:  End stage primary osteoarthritis, left hip(s)  The patient history, physical examination, clinical judgement of the provider and imaging studies are consistent with end stage degenerative joint disease of the left hip(s) and total hip arthroplasty is deemed medically necessary. The treatment options including medical management, injection therapy, arthroscopy and arthroplasty were discussed at length. The risks and benefits of total hip arthroplasty were presented  and reviewed. The risks due to aseptic loosening, infection, stiffness, dislocation/subluxation,  thromboembolic complications and other imponderables were discussed.  The patient acknowledged the explanation, agreed to proceed with the plan and consent was signed. Patient is being admitted for inpatient treatment for surgery, pain control, PT, OT, prophylactic antibiotics, VTE prophylaxis, progressive ambulation and ADL's and discharge planning.The patient is planning to be discharged home with HEP.    Risks and benefits of the surgery were discussed with the patient and Dr.Aluisio at their previous office visit, and the patient has elected to move forward with the aforementioned surgery. Post-operative care plans were discussed with the patient today.  Therapy Plans: HEP Disposition: Home with wife Planned DVT prophylaxis: aspirin 325mg  BID DME needed: walker and 3-n-1 PCP: Dr. Carlota Raspberry- awaiting clearance Other: no anesthesia concerns  Instructed patient about which meds to stop prior to surgery  Ardeen Jourdain, PA-C

## 2019-05-29 ENCOUNTER — Inpatient Hospital Stay (HOSPITAL_COMMUNITY)
Admission: RE | Admit: 2019-05-29 | Discharge: 2019-05-29 | DRG: 470 | Disposition: A | Discharge status: Home / Self Care | Payer: Medicare HMO | Attending: Orthopedic Surgery | Admitting: Orthopedic Surgery

## 2019-05-29 ENCOUNTER — Ambulatory Visit (HOSPITAL_COMMUNITY): Payer: Medicare HMO

## 2019-05-29 ENCOUNTER — Encounter (HOSPITAL_COMMUNITY)
Admission: RE | Disposition: A | Discharge status: Home / Self Care | Payer: Self-pay | Source: Home / Self Care | Attending: Orthopedic Surgery

## 2019-05-29 ENCOUNTER — Ambulatory Visit (HOSPITAL_COMMUNITY): Payer: Medicare HMO | Admitting: Certified Registered"

## 2019-05-29 ENCOUNTER — Ambulatory Visit (HOSPITAL_COMMUNITY): Payer: Medicare HMO | Admitting: Physician Assistant

## 2019-05-29 ENCOUNTER — Encounter (HOSPITAL_COMMUNITY): Payer: Self-pay | Admitting: Orthopedic Surgery

## 2019-05-29 DIAGNOSIS — Z419 Encounter for procedure for purposes other than remedying health state, unspecified: Secondary | ICD-10-CM

## 2019-05-29 DIAGNOSIS — Z8249 Family history of ischemic heart disease and other diseases of the circulatory system: Secondary | ICD-10-CM

## 2019-05-29 DIAGNOSIS — Z823 Family history of stroke: Secondary | ICD-10-CM | POA: Diagnosis not present

## 2019-05-29 DIAGNOSIS — Z96649 Presence of unspecified artificial hip joint: Secondary | ICD-10-CM

## 2019-05-29 DIAGNOSIS — G4700 Insomnia, unspecified: Secondary | ICD-10-CM | POA: Diagnosis not present

## 2019-05-29 DIAGNOSIS — Z471 Aftercare following joint replacement surgery: Secondary | ICD-10-CM | POA: Diagnosis not present

## 2019-05-29 DIAGNOSIS — M1612 Unilateral primary osteoarthritis, left hip: Secondary | ICD-10-CM | POA: Diagnosis not present

## 2019-05-29 DIAGNOSIS — M169 Osteoarthritis of hip, unspecified: Secondary | ICD-10-CM | POA: Diagnosis present

## 2019-05-29 DIAGNOSIS — M13852 Other specified arthritis, left hip: Secondary | ICD-10-CM | POA: Diagnosis present

## 2019-05-29 DIAGNOSIS — Z96642 Presence of left artificial hip joint: Secondary | ICD-10-CM | POA: Diagnosis not present

## 2019-05-29 HISTORY — PX: TOTAL HIP ARTHROPLASTY: SHX124

## 2019-05-29 LAB — TYPE AND SCREEN
ABO/RH(D): O POS
Antibody Screen: NEGATIVE

## 2019-05-29 SURGERY — ARTHROPLASTY, HIP, TOTAL, ANTERIOR APPROACH
Anesthesia: Spinal | Site: Hip | Laterality: Left

## 2019-05-29 MED ORDER — LIDOCAINE 2% (20 MG/ML) 5 ML SYRINGE
INTRAMUSCULAR | Status: AC
  Filled 2019-05-29: qty 10

## 2019-05-29 MED ORDER — LACTATED RINGERS IV BOLUS
500.0000 mL | Freq: Once | INTRAVENOUS | Status: AC
  Administered 2019-05-29: 500 mL via INTRAVENOUS

## 2019-05-29 MED ORDER — MIDAZOLAM HCL 2 MG/2ML IJ SOLN
INTRAMUSCULAR | Status: AC
  Filled 2019-05-29: qty 2

## 2019-05-29 MED ORDER — SODIUM CHLORIDE 0.9 % IV SOLN: INTRAVENOUS | Status: DC

## 2019-05-29 MED ORDER — METHOCARBAMOL 500 MG IVPB - SIMPLE MED
INTRAVENOUS | Status: AC
  Filled 2019-05-29: qty 50

## 2019-05-29 MED ORDER — DEXAMETHASONE SODIUM PHOSPHATE 10 MG/ML IJ SOLN
INTRAMUSCULAR | Status: DC | PRN
  Administered 2019-05-29: 8 mg via INTRAVENOUS

## 2019-05-29 MED ORDER — CEFAZOLIN SODIUM-DEXTROSE 2-4 GM/100ML-% IV SOLN
2.0000 g | Freq: Four times a day (QID) | INTRAVENOUS | Status: DC
  Administered 2019-05-29: 14:00:00 2 g via INTRAVENOUS

## 2019-05-29 MED ORDER — BUPIVACAINE HCL 0.25 % IJ SOLN
INTRAMUSCULAR | Status: DC | PRN
  Administered 2019-05-29: 30 mL

## 2019-05-29 MED ORDER — PROPOFOL 10 MG/ML IV BOLUS
INTRAVENOUS | Status: DC | PRN
  Administered 2019-05-29: 10 mg via INTRAVENOUS
  Administered 2019-05-29: 20 mg via INTRAVENOUS

## 2019-05-29 MED ORDER — ASPIRIN EC 325 MG PO TBEC: 325.0000 mg | DELAYED_RELEASE_TABLET | Freq: Two times a day (BID) | ORAL | Status: DC

## 2019-05-29 MED ORDER — DIPHENHYDRAMINE HCL 12.5 MG/5ML PO ELIX: 12.5000 mg | ORAL_SOLUTION | ORAL | Status: DC | PRN

## 2019-05-29 MED ORDER — LACTATED RINGERS IV BOLUS
250.0000 mL | Freq: Once | INTRAVENOUS | Status: AC
  Administered 2019-05-29: 14:00:00 250 mL via INTRAVENOUS

## 2019-05-29 MED ORDER — CHLORHEXIDINE GLUCONATE 4 % EX LIQD: 60.0000 mL | Freq: Once | CUTANEOUS | Status: DC

## 2019-05-29 MED ORDER — MEPERIDINE HCL 50 MG/ML IJ SOLN: 6.2500 mg | INTRAMUSCULAR | Status: DC | PRN

## 2019-05-29 MED ORDER — METHOCARBAMOL 500 MG PO TABS: 500.0000 mg | ORAL_TABLET | Freq: Four times a day (QID) | ORAL | Status: DC | PRN

## 2019-05-29 MED ORDER — KETOROLAC TROMETHAMINE 30 MG/ML IJ SOLN
INTRAMUSCULAR | Status: AC
  Filled 2019-05-29: qty 1

## 2019-05-29 MED ORDER — POVIDONE-IODINE 10 % EX SWAB
2.0000 "application " | Freq: Once | CUTANEOUS | Status: AC
  Administered 2019-05-29: 2 via TOPICAL

## 2019-05-29 MED ORDER — OXYCODONE HCL 5 MG PO TABS
ORAL_TABLET | ORAL | Status: AC
  Filled 2019-05-29: qty 1

## 2019-05-29 MED ORDER — ACETAMINOPHEN 500 MG PO TABS
500.0000 mg | ORAL_TABLET | Freq: Four times a day (QID) | ORAL | Status: DC
  Administered 2019-05-29: 15:00:00 500 mg via ORAL

## 2019-05-29 MED ORDER — ASPIRIN EC 325 MG PO TBEC: 325.0000 mg | DELAYED_RELEASE_TABLET | Freq: Two times a day (BID) | ORAL | 0 refills | Status: AC

## 2019-05-29 MED ORDER — IBUPROFEN 200 MG PO TABS: 200.0000 mg | ORAL_TABLET | Freq: Four times a day (QID) | ORAL | Status: DC | PRN

## 2019-05-29 MED ORDER — HYDROCODONE-ACETAMINOPHEN 5-325 MG PO TABS: 1.0000 | ORAL_TABLET | Freq: Four times a day (QID) | ORAL | 0 refills | Status: DC | PRN

## 2019-05-29 MED ORDER — ROCURONIUM BROMIDE 10 MG/ML (PF) SYRINGE
PREFILLED_SYRINGE | INTRAVENOUS | Status: AC
  Filled 2019-05-29: qty 10

## 2019-05-29 MED ORDER — ACETAMINOPHEN 500 MG PO TABS
ORAL_TABLET | ORAL | Status: AC
  Filled 2019-05-29: qty 1

## 2019-05-29 MED ORDER — PROPOFOL 500 MG/50ML IV EMUL
INTRAVENOUS | Status: DC | PRN
  Administered 2019-05-29: 75 ug/kg/min via INTRAVENOUS

## 2019-05-29 MED ORDER — OXYCODONE HCL 5 MG/5ML PO SOLN: 5.0000 mg | Freq: Once | ORAL | Status: AC | PRN

## 2019-05-29 MED ORDER — ONDANSETRON HCL 4 MG/2ML IJ SOLN
INTRAMUSCULAR | Status: AC
  Filled 2019-05-29: qty 4

## 2019-05-29 MED ORDER — LACTATED RINGERS IV BOLUS
250.0000 mL | Freq: Once | INTRAVENOUS | Status: AC
  Administered 2019-05-29: 250 mL via INTRAVENOUS

## 2019-05-29 MED ORDER — CEFAZOLIN SODIUM-DEXTROSE 2-4 GM/100ML-% IV SOLN
2.0000 g | INTRAVENOUS | Status: AC
  Administered 2019-05-29: 08:00:00 2 g via INTRAVENOUS
  Filled 2019-05-29: qty 100

## 2019-05-29 MED ORDER — PHENYLEPHRINE 40 MCG/ML (10ML) SYRINGE FOR IV PUSH (FOR BLOOD PRESSURE SUPPORT)
PREFILLED_SYRINGE | INTRAVENOUS | Status: AC
  Filled 2019-05-29: qty 10

## 2019-05-29 MED ORDER — 0.9 % SODIUM CHLORIDE (POUR BTL) OPTIME
TOPICAL | Status: DC | PRN
  Administered 2019-05-29: 1000 mL

## 2019-05-29 MED ORDER — LACTATED RINGERS IV SOLN: INTRAVENOUS | Status: DC

## 2019-05-29 MED ORDER — TRAMADOL HCL 50 MG PO TABS: 50.0000 mg | ORAL_TABLET | Freq: Four times a day (QID) | ORAL | Status: DC | PRN

## 2019-05-29 MED ORDER — HYDROCODONE-ACETAMINOPHEN 5-325 MG PO TABS: 1.0000 | ORAL_TABLET | ORAL | Status: DC | PRN

## 2019-05-29 MED ORDER — FLEET ENEMA 7-19 GM/118ML RE ENEM: 1.0000 | ENEMA | Freq: Once | RECTAL | Status: DC | PRN

## 2019-05-29 MED ORDER — MEPIVACAINE HCL (PF) 2 % IJ SOLN
INTRAMUSCULAR | Status: DC | PRN
  Administered 2019-05-29: 70 mg via EPIDURAL

## 2019-05-29 MED ORDER — CEFAZOLIN SODIUM-DEXTROSE 2-4 GM/100ML-% IV SOLN
INTRAVENOUS | Status: AC
  Filled 2019-05-29: qty 100

## 2019-05-29 MED ORDER — EPHEDRINE 5 MG/ML INJ
INTRAVENOUS | Status: AC
  Filled 2019-05-29: qty 10

## 2019-05-29 MED ORDER — DEXAMETHASONE SODIUM PHOSPHATE 10 MG/ML IJ SOLN: 10.0000 mg | Freq: Once | INTRAMUSCULAR | Status: DC

## 2019-05-29 MED ORDER — PROPOFOL 10 MG/ML IV BOLUS
INTRAVENOUS | Status: AC
  Filled 2019-05-29: qty 20

## 2019-05-29 MED ORDER — MIDAZOLAM HCL 2 MG/2ML IJ SOLN
INTRAMUSCULAR | Status: DC | PRN
  Administered 2019-05-29 (×2): 1 mg via INTRAVENOUS

## 2019-05-29 MED ORDER — BISACODYL 10 MG RE SUPP: 10.0000 mg | Freq: Every day | RECTAL | Status: DC | PRN

## 2019-05-29 MED ORDER — WATER FOR IRRIGATION, STERILE IR SOLN
Status: DC | PRN
  Administered 2019-05-29: 2000 mL

## 2019-05-29 MED ORDER — METHOCARBAMOL 500 MG PO TABS: 500.0000 mg | ORAL_TABLET | Freq: Four times a day (QID) | ORAL | 0 refills | Status: DC | PRN

## 2019-05-29 MED ORDER — METOCLOPRAMIDE HCL 5 MG PO TABS: 5.0000 mg | ORAL_TABLET | Freq: Three times a day (TID) | ORAL | Status: DC | PRN

## 2019-05-29 MED ORDER — DEXAMETHASONE SODIUM PHOSPHATE 10 MG/ML IJ SOLN
INTRAMUSCULAR | Status: AC
  Filled 2019-05-29: qty 2

## 2019-05-29 MED ORDER — PHENYLEPHRINE HCL-NACL 10-0.9 MG/250ML-% IV SOLN
INTRAVENOUS | Status: DC | PRN
  Administered 2019-05-29: 25 ug/min via INTRAVENOUS

## 2019-05-29 MED ORDER — ONDANSETRON HCL 4 MG/2ML IJ SOLN
INTRAMUSCULAR | Status: DC | PRN
  Administered 2019-05-29: 4 mg via INTRAVENOUS

## 2019-05-29 MED ORDER — MEPIVACAINE HCL (PF) 2 % IJ SOLN: INTRAMUSCULAR | Status: DC | PRN

## 2019-05-29 MED ORDER — IBUPROFEN 100 MG/5ML PO SUSP
200.0000 mg | Freq: Four times a day (QID) | ORAL | Status: DC | PRN
  Filled 2019-05-29: qty 20

## 2019-05-29 MED ORDER — BENFOTIAMINE 150 MG PO CAPS: 150.0000 mg | ORAL_CAPSULE | Freq: Every day | ORAL | Status: DC

## 2019-05-29 MED ORDER — BUPIVACAINE HCL 0.25 % IJ SOLN
INTRAMUSCULAR | Status: AC
  Filled 2019-05-29: qty 1

## 2019-05-29 MED ORDER — ONDANSETRON HCL 4 MG/2ML IJ SOLN: 4.0000 mg | Freq: Four times a day (QID) | INTRAMUSCULAR | Status: DC | PRN

## 2019-05-29 MED ORDER — MEPIVACAINE HCL (PF) 2 % IJ SOLN
INTRAMUSCULAR | Status: AC
  Filled 2019-05-29: qty 40

## 2019-05-29 MED ORDER — METOCLOPRAMIDE HCL 5 MG/ML IJ SOLN: 5.0000 mg | Freq: Three times a day (TID) | INTRAMUSCULAR | Status: DC | PRN

## 2019-05-29 MED ORDER — TRAZODONE HCL 100 MG PO TABS: 100.0000 mg | ORAL_TABLET | Freq: Every day | ORAL | Status: DC

## 2019-05-29 MED ORDER — TRAMADOL HCL 50 MG PO TABS: 50.0000 mg | ORAL_TABLET | Freq: Four times a day (QID) | ORAL | 0 refills | Status: DC | PRN

## 2019-05-29 MED ORDER — KETOROLAC TROMETHAMINE 30 MG/ML IJ SOLN
30.0000 mg | Freq: Once | INTRAMUSCULAR | Status: AC | PRN
  Administered 2019-05-29: 30 mg via INTRAVENOUS

## 2019-05-29 MED ORDER — PHENYLEPHRINE HCL (PRESSORS) 10 MG/ML IV SOLN
INTRAVENOUS | Status: AC
  Filled 2019-05-29: qty 2

## 2019-05-29 MED ORDER — SUCCINYLCHOLINE CHLORIDE 200 MG/10ML IV SOSY
PREFILLED_SYRINGE | INTRAVENOUS | Status: AC
  Filled 2019-05-29: qty 10

## 2019-05-29 MED ORDER — LIDOCAINE 2% (20 MG/ML) 5 ML SYRINGE
INTRAMUSCULAR | Status: DC | PRN
  Administered 2019-05-29: 40 mg via INTRAVENOUS

## 2019-05-29 MED ORDER — PHENOL 1.4 % MT LIQD: 1.0000 | OROMUCOSAL | Status: DC | PRN

## 2019-05-29 MED ORDER — ATORVASTATIN CALCIUM 10 MG PO TABS: 5.0000 mg | ORAL_TABLET | Freq: Every day | ORAL | Status: DC

## 2019-05-29 MED ORDER — TRANEXAMIC ACID-NACL 1000-0.7 MG/100ML-% IV SOLN
1000.0000 mg | INTRAVENOUS | Status: AC
  Administered 2019-05-29: 08:00:00 1000 mg via INTRAVENOUS
  Filled 2019-05-29: qty 100

## 2019-05-29 MED ORDER — ONDANSETRON HCL 4 MG/2ML IJ SOLN: 4.0000 mg | Freq: Once | INTRAMUSCULAR | Status: DC | PRN

## 2019-05-29 MED ORDER — MORPHINE SULFATE (PF) 4 MG/ML IV SOLN
INTRAVENOUS | Status: AC
  Filled 2019-05-29: qty 1

## 2019-05-29 MED ORDER — METHOCARBAMOL 500 MG IVPB - SIMPLE MED
500.0000 mg | Freq: Four times a day (QID) | INTRAVENOUS | Status: DC | PRN
  Administered 2019-05-29: 11:00:00 500 mg via INTRAVENOUS

## 2019-05-29 MED ORDER — MORPHINE SULFATE (PF) 4 MG/ML IV SOLN
1.0000 mg | INTRAVENOUS | Status: DC | PRN
  Administered 2019-05-29: 1 mg via INTRAVENOUS

## 2019-05-29 MED ORDER — OXYCODONE HCL 5 MG PO TABS
5.0000 mg | ORAL_TABLET | Freq: Once | ORAL | Status: AC | PRN
  Administered 2019-05-29: 5 mg via ORAL

## 2019-05-29 MED ORDER — POLYETHYLENE GLYCOL 3350 17 G PO PACK: 17.0000 g | PACK | Freq: Every day | ORAL | Status: DC | PRN

## 2019-05-29 MED ORDER — FENTANYL CITRATE (PF) 250 MCG/5ML IJ SOLN
INTRAMUSCULAR | Status: DC | PRN
  Administered 2019-05-29 (×2): 50 ug via INTRAVENOUS

## 2019-05-29 MED ORDER — FENTANYL CITRATE (PF) 100 MCG/2ML IJ SOLN: 25.0000 ug | INTRAMUSCULAR | Status: DC | PRN

## 2019-05-29 MED ORDER — DEXAMETHASONE SODIUM PHOSPHATE 10 MG/ML IJ SOLN: 8.0000 mg | Freq: Once | INTRAMUSCULAR | Status: DC

## 2019-05-29 MED ORDER — FENTANYL CITRATE (PF) 250 MCG/5ML IJ SOLN
INTRAMUSCULAR | Status: AC
  Filled 2019-05-29: qty 5

## 2019-05-29 MED ORDER — ACETAMINOPHEN 10 MG/ML IV SOLN
1000.0000 mg | Freq: Four times a day (QID) | INTRAVENOUS | Status: DC
  Administered 2019-05-29: 1000 mg via INTRAVENOUS
  Filled 2019-05-29: qty 100

## 2019-05-29 MED ORDER — ONDANSETRON HCL 4 MG PO TABS: 4.0000 mg | ORAL_TABLET | Freq: Four times a day (QID) | ORAL | Status: DC | PRN

## 2019-05-29 MED ORDER — DOCUSATE SODIUM 100 MG PO CAPS: 100.0000 mg | ORAL_CAPSULE | Freq: Two times a day (BID) | ORAL | Status: DC

## 2019-05-29 MED ORDER — MENTHOL 3 MG MT LOZG: 1.0000 | LOZENGE | OROMUCOSAL | Status: DC | PRN

## 2019-05-29 SURGICAL SUPPLY — 47 items
BAG DECANTER FOR FLEXI CONT (MISCELLANEOUS) IMPLANT
BAG ZIPLOCK 12X15 (MISCELLANEOUS) IMPLANT
BLADE SAG 18X100X1.27 (BLADE) ×2 IMPLANT
COVER PERINEAL POST (MISCELLANEOUS) ×2 IMPLANT
COVER SURGICAL LIGHT HANDLE (MISCELLANEOUS) ×2 IMPLANT
COVER WAND RF STERILE (DRAPES) IMPLANT
CUP ACETBLR 52 OD PINNACLE (Hips) ×2 IMPLANT
DECANTER SPIKE VIAL GLASS SM (MISCELLANEOUS) ×2 IMPLANT
DRAPE STERI IOBAN 125X83 (DRAPES) ×2 IMPLANT
DRAPE U-SHAPE 47X51 STRL (DRAPES) ×4 IMPLANT
DRESSING AQUACEL AG SP 3.5X10 (GAUZE/BANDAGES/DRESSINGS) ×1 IMPLANT
DRSG ADAPTIC 3X8 NADH LF (GAUZE/BANDAGES/DRESSINGS) ×2 IMPLANT
DRSG AQUACEL AG SP 3.5X10 (GAUZE/BANDAGES/DRESSINGS) ×2
DRSG MEPILEX BORDER 4X4 (GAUZE/BANDAGES/DRESSINGS) ×2 IMPLANT
DRSG MEPILEX BORDER 4X8 (GAUZE/BANDAGES/DRESSINGS) ×2 IMPLANT
DURAPREP 26ML APPLICATOR (WOUND CARE) ×2 IMPLANT
ELECT REM PT RETURN 15FT ADLT (MISCELLANEOUS) ×2 IMPLANT
EVACUATOR 1/8 PVC DRAIN (DRAIN) ×2 IMPLANT
GLOVE BIO SURGEON STRL SZ 6 (GLOVE) IMPLANT
GLOVE BIO SURGEON STRL SZ7 (GLOVE) IMPLANT
GLOVE BIO SURGEON STRL SZ8 (GLOVE) ×2 IMPLANT
GLOVE BIOGEL PI IND STRL 6.5 (GLOVE) IMPLANT
GLOVE BIOGEL PI IND STRL 7.0 (GLOVE) IMPLANT
GLOVE BIOGEL PI IND STRL 8 (GLOVE) ×1 IMPLANT
GLOVE BIOGEL PI INDICATOR 6.5 (GLOVE)
GLOVE BIOGEL PI INDICATOR 7.0 (GLOVE)
GLOVE BIOGEL PI INDICATOR 8 (GLOVE) ×1
GOWN STRL REUS W/TWL LRG LVL3 (GOWN DISPOSABLE) ×2 IMPLANT
GOWN STRL REUS W/TWL XL LVL3 (GOWN DISPOSABLE) IMPLANT
HEAD FEMORAL 32 CERAMIC (Hips) ×2 IMPLANT
HOLDER FOLEY CATH W/STRAP (MISCELLANEOUS) ×2 IMPLANT
KIT TURNOVER KIT A (KITS) IMPLANT
LINER MARATHON NEUT +4X52X32 (Hips) ×2 IMPLANT
MANIFOLD NEPTUNE II (INSTRUMENTS) ×2 IMPLANT
PACK ANTERIOR HIP CUSTOM (KITS) ×2 IMPLANT
PENCIL SMOKE EVACUATOR (MISCELLANEOUS) ×2 IMPLANT
STEM FEMORAL SZ9 HIGH ACTIS (Stem) ×2 IMPLANT
STRIP CLOSURE SKIN 1/2X4 (GAUZE/BANDAGES/DRESSINGS) ×4 IMPLANT
SUT ETHIBOND NAB CT1 #1 30IN (SUTURE) ×2 IMPLANT
SUT MNCRL AB 4-0 PS2 18 (SUTURE) ×2 IMPLANT
SUT STRATAFIX 0 PDS 27 VIOLET (SUTURE) ×2
SUT VIC AB 2-0 CT1 27 (SUTURE) ×2
SUT VIC AB 2-0 CT1 TAPERPNT 27 (SUTURE) ×2 IMPLANT
SUTURE STRATFX 0 PDS 27 VIOLET (SUTURE) ×1 IMPLANT
SYR 50ML LL SCALE MARK (SYRINGE) IMPLANT
TRAY FOLEY MTR SLVR 16FR STAT (SET/KITS/TRAYS/PACK) ×2 IMPLANT
YANKAUER SUCT BULB TIP 10FT TU (MISCELLANEOUS) ×2 IMPLANT

## 2019-05-29 NOTE — Op Note (Signed)
OPERATIVE REPORT- TOTAL HIP ARTHROPLASTY   PREOPERATIVE DIAGNOSIS: Osteoarthritis of the Left hip.   POSTOPERATIVE DIAGNOSIS: Osteoarthritis of the Left  hip.   PROCEDURE: Left total hip arthroplasty, anterior approach.   SURGEON: Gaynelle Arabian, MD   ASSISTANT: Theresa Duty, PA-C  ANESTHESIA:  Spinal  ESTIMATED BLOOD LOSS:-600 mL    DRAINS: Hemovac x1.   COMPLICATIONS: None   CONDITION: PACU - hemodynamically stable.   BRIEF CLINICAL NOTE: Rodney Thien. is a 68 y.o. male who has advanced end-  stage arthritis of their Left  hip with progressively worsening pain and  dysfunction.The patient has failed nonoperative management and presents for  total hip arthroplasty.   PROCEDURE IN DETAIL: After successful administration of spinal  anesthetic, the traction boots for the [facility] bed were placed on both  feet and the patient was placed onto the [facility] bed, boots placed into the leg  holders. The Left hip was then isolated from the perineum with plastic  drapes and prepped and draped in the usual sterile fashion. ASIS and  greater trochanter were marked and a oblique incision was made, starting  at about 1 cm lateral and 2 cm distal to the ASIS and coursing towards  the anterior cortex of the femur. The skin was cut with a 10 blade  through subcutaneous tissue to the level of the fascia overlying the  tensor fascia lata muscle. The fascia was then incised in line with the  incision at the junction of the anterior third and posterior 2/3rd. The  muscle was teased off the fascia and then the interval between the TFL  and the rectus was developed. The Hohmann retractor was then placed at  the top of the femoral neck over the capsule. The vessels overlying the  capsule were cauterized and the fat on top of the capsule was removed.  A Hohmann retractor was then placed anterior underneath the rectus  femoris to give exposure to the entire anterior capsule. A T-shaped   capsulotomy was performed. The edges were tagged and the femoral head  was identified.       Osteophytes are removed off the superior acetabulum.  The femoral neck was then cut in situ with an oscillating saw. Traction  was then applied to the left lower extremity utilizing the [facility]  traction. The femoral head was then removed. Retractors were placed  around the acetabulum and then circumferential removal of the labrum was  performed. Osteophytes were also removed. Reaming starts at 47 mm to  medialize and  Increased in 2 mm increments to 51 mm. We reamed in  approximately 40 degrees of abduction, 20 degrees anteversion. A 52 mm  pinnacle acetabular shell was then impacted in anatomic position under  fluoroscopic guidance with excellent purchase. We did not need to place  any additional dome screws. A 32 mm neutral + 4 marathon liner was then  placed into the acetabular shell.       The femoral lift was then placed along the lateral aspect of the femur  just distal to the vastus ridge. The leg was  externally rotated and capsule  was stripped off the inferior aspect of the femoral neck down to the  level of the lesser trochanter, this was done with electrocautery. The femur was lifted after this was performed. The  leg was then placed in an extended and adducted position essentially delivering the femur. We also removed the capsule superiorly and the piriformis from the  piriformis fossa to gain excellent exposure of the  proximal femur. Rongeur was used to remove some cancellous bone to get  into the lateral portion of the proximal femur for placement of the  initial starter reamer. The starter broaches was placed  the starter broach  and was shown to go down the center of the canal. Broaching  with the Actis system was then performed starting at size 0  coursing  Up to size 9. A size 9 had excellent torsional and rotational  and axial stability. The trial high offset neck was then placed   with a 32 + 1 trial head. The hip was then reduced. We confirmed that  the stem was in the canal both on AP and lateral x-rays. It also has excellent sizing. The hip was reduced with outstanding stability through full extension and full external rotation.. AP pelvis was taken and the leg lengths were measured and found to be equal. Hip was then dislocated again and the femoral head and neck removed. The  femoral broach was removed. Size 9 Actis stem with a high offset  neck was then impacted into the femur following native anteversion. Has  excellent purchase in the canal. Excellent torsional and rotational and  axial stability. It is confirmed to be in the canal on AP and lateral  fluoroscopic views. The 32 + 1 ceramic head was placed and the hip  reduced with outstanding stability. Again AP pelvis was taken and it  confirmed that the leg lengths were equal. The wound was then copiously  irrigated with saline solution and the capsule reattached and repaired  with Ethibond suture. 30 ml of .25% Bupivicaine was  injected into the capsule and into the edge of the tensor fascia lata as well as subcutaneous tissue. The fascia overlying the tensor fascia lata was then closed with a running #1 V-Loc. Subcu was closed with interrupted 2-0 Vicryl and subcuticular running 4-0 Monocryl. Incision was cleaned  and dried. Steri-Strips and a bulky sterile dressing applied. Hemovac  drain was hooked to suction and then the patient was awakened and transported to  recovery in stable condition.        Please note that a surgical assistant was a medical necessity for this procedure to perform it in a safe and expeditious manner. Assistant was necessary to provide appropriate retraction of vital neurovascular structures and to prevent femoral fracture and allow for anatomic placement of the prosthesis.  Gaynelle Arabian, M.D.

## 2019-05-29 NOTE — Anesthesia Procedure Notes (Signed)
Spinal  Patient location during procedure: OR Start time: 05/29/2019 7:27 AM End time: 05/29/2019 7:30 AM Staffing Performed: anesthesiologist  Anesthesiologist: Lyn Hollingshead, MD Preanesthetic Checklist Completed: patient identified, IV checked, site marked, risks and benefits discussed, surgical consent, monitors and equipment checked, pre-op evaluation and timeout performed Spinal Block Patient position: sitting Prep: DuraPrep and site prepped and draped Patient monitoring: continuous pulse ox and blood pressure Approach: midline Location: L3-4 Injection technique: single-shot Needle Needle type: Pencan  Needle gauge: 24 G Needle length: 10 cm Needle insertion depth: 5 cm Assessment Sensory level: T10

## 2019-05-29 NOTE — Interval H&P Note (Signed)
History and Physical Interval Note:  05/29/2019 6:49 AM  Rodney Bailey.  has presented today for surgery, with the diagnosis of left hip osteoarthritis.  The various methods of treatment have been discussed with the patient and family. After consideration of risks, benefits and other options for treatment, the patient has consented to  Procedure(s) with comments: [place] (Left) - 11min as a surgical intervention.  The patient's history has been reviewed, patient examined, no change in status, stable for surgery.  I have reviewed the patient's chart and labs.  Questions were answered to the patient's satisfaction.     [name] [name]

## 2019-05-29 NOTE — Transfer of Care (Signed)
Immediate Anesthesia Transfer of Care Note  Patient: Rodney Bailey.  Procedure(s) Performed: TOTAL HIP ARTHROPLASTY ANTERIOR APPROACH SAME DAY DISCHARGE (Left Hip)  Patient Location: PACU  Anesthesia Type:Spinal  Level of Consciousness: sedated  Airway & Oxygen Therapy: Patient Spontanous Breathing and Patient connected to face mask oxygen  Post-op Assessment: Report given to RN and Post -op Vital signs reviewed and stable  Post vital signs: Reviewed and stable  Last Vitals:  Vitals Value Taken Time  BP 102/62 05/29/19 0921  Temp    Pulse 64 05/29/19 0922  Resp 12 05/29/19 0922  SpO2 100 % 05/29/19 0922  Vitals shown include unvalidated device data.  Last Pain:  Vitals:   05/29/19 [id number]  TempSrc:   PainSc: 0-No pain         Complications: No apparent anesthesia complications

## 2019-05-29 NOTE — Anesthesia Procedure Notes (Signed)
Date/Time: 05/29/2019 7:17 AM Performed by: Cynda Familia, CRNA Pre-anesthesia Checklist: Patient identified, Emergency Drugs available, Patient being monitored, Timeout performed and Suction available Patient Re-evaluated:Patient Re-evaluated prior to induction Oxygen Delivery Method: Simple face mask Placement Confirmation: positive ETCO2 and breath sounds checked- equal and bilateral Dental Injury: Teeth and Oropharynx as per pre-operative assessment

## 2019-05-29 NOTE — Anesthesia Postprocedure Evaluation (Signed)
Anesthesia Post Note  Patient: Rodney Bailey.  Procedure(s) Performed: TOTAL HIP ARTHROPLASTY ANTERIOR APPROACH SAME DAY DISCHARGE (Left Hip)     Patient location during evaluation: Phase II Anesthesia Type: Spinal Level of consciousness: awake Pain management: pain level controlled Vital Signs Assessment: post-procedure vital signs reviewed and stable Respiratory status: spontaneous breathing Cardiovascular status: stable Postop Assessment: no headache, no backache, spinal receding, patient able to bend at knees and no apparent nausea or vomiting Anesthetic complications: no    Last Vitals:  Vitals:   05/29/19 1030 05/29/19 1045  BP: 122/83 139/79  Pulse: 61 66  Resp: 18 13  Temp:  36.4 C  SpO2: 98% 100%    Last Pain:  Vitals:   05/29/19 1045  TempSrc:   PainSc: 0-No pain   Pain Goal:    LLE Motor Response: Purposeful movement (05/29/19 1045) LLE Sensation: Decreased (05/29/19 1045) RLE Motor Response: Purposeful movement (05/29/19 1045) RLE Sensation: Decreased (05/29/19 1045) L Sensory Level: L3-Anterior knee, lower leg (05/29/19 1045) R Sensory Level: L3-Anterior knee, lower leg (05/29/19 1045) Epidural/Spinal Function Patient able to flex knees: Yes (05/29/19 1045), Patient able to lift hips off bed: No (05/29/19 1030), Back pain beyond tenderness at insertion site: No (05/29/19 1030)  Huston Foley

## 2019-05-29 NOTE — Progress Notes (Signed)
Patient states that his wife is a Marine scientist and she will not be picking him up to be discharged home until 5:30 to 6 pm tonight after she is finished with work.  Dr. Wynelle Link is aware of this and that patient will remain in PACU due to transportation and caregiver reasons.

## 2019-05-29 NOTE — Evaluation (Signed)
Physical Therapy Evaluation Patient Details Name: Rodney Bailey. MRN: [id number] DOB: 1951-04-15 Today's Date: 05/29/2019   History of Present Illness  Patient is 68 y.o. male s/p Lt THA anterior approach on 05/29/19 with PMH significant for OA.  Clinical Impression  Buren Havey. is a 68 y.o. male POD 0 s/p Lt THA anterior approach. Patient reports independence with mobility at baseline with recent use of SPC and RW to relieve hip pain. Patient is now limited by functional impairments (see PT problem list below) and requires supervision/min guard for transfers and gait with RW. Patient was able to ambulate ~120 feet with RW and supervision and cues for safe walker management. Patient educated on safe sequencing for stair mobility and verbalized safe guarding position for people assisting with mobility. Patient instructed in exercises to facilitate ROM and circulation. Patient will benefit from continued skilled PT interventions to address impairments and progress towards PLOF. Patient has met mobility goals at adequate level for discharge home; will continue to follow if pt continues acute stay to progress towards Mod I goals.    Follow Up Recommendations Follow surgeon's recommendation for DC plan and follow-up therapies    Equipment Recommendations  Rolling walker with 5" wheels;3in1 (PT)(BSC and RW delivered to pt in PACU)    Recommendations for Other Services       Precautions / Restrictions Precautions Precautions: Fall Restrictions Weight Bearing Restrictions: No      Mobility  Bed Mobility Overal bed mobility: Needs Assistance Bed Mobility: Supine to Sit     Supine to sit: HOB elevated;Supervision     General bed mobility comments: no cues required or assistance to raise up. educated pt on use of gait belt to assist with raising Lt LE into car or bed.  Transfers Overall transfer level: Needs assistance Equipment used: Rolling walker (2 wheeled) Transfers: Sit  to/from Stand Sit to Stand: Supervision         General transfer comment: cues for safe hand placement and technique with RW, no assist required for power up and pt steady with rising  Ambulation/Gait Ambulation/Gait assistance: Supervision;Min guard Gait Distance (Feet): 120 Feet Assistive device: Rolling walker (2 wheeled) Gait Pattern/deviations: Step-through pattern;Decreased stride length;Decreased stance time - left;Decreased step length - right;Antalgic Gait velocity: decreased   General Gait Details: verbal cues for safe hand placement and technique with RW, no overt LOB noted. cues to improve step pattern as pt initialy shuffling feet, pt able to increase step length and maitain safe proximity to RW.  Stairs Stairs: Yes Stairs assistance: Supervision Stair Management: No rails;Step to pattern;With walker;Backwards Number of Stairs: 2(2x 1) General stair comments: verbal cues for safe step sequencing "up with good, down with bad" and use of RW for reverse step up to negotiate single step and threshold to enter front door. pt verbalized understanding of safe guarding position for person(s) assisting.  Wheelchair Mobility    Modified Rankin (Stroke Patients Only)       Balance Overall balance assessment: Mild deficits observed, not formally tested          Pertinent Vitals/Pain Pain Assessment: No/denies pain    Home Living Family/patient expects to be discharged to:: Private residence Living Arrangements: Spouse/significant other Available Help at Discharge: Family;Available 24 hours/day Type of Home: House Home Access: Stairs to enter Entrance Stairs-Rails: None Entrance Stairs-Number of Steps: 1 Home Layout: One level Home Equipment: Walker - 2 wheels;Bedside commode;Cane - single point      Prior Function Level  of Independence: Independent;Independent with assistive device(s)         Comments: pt typically independent with no device, has been using  SPC for ~ 1 month and a RW more recently for pain     Hand Dominance   Dominant Hand: Right    Extremity/Trunk Assessment   Upper Extremity Assessment Upper Extremity Assessment: Overall WFL for tasks assessed    Lower Extremity Assessment Lower Extremity Assessment: Overall WFL for tasks assessed    Cervical / Trunk Assessment Cervical / Trunk Assessment: Normal  Communication   Communication: No difficulties  Cognition Arousal/Alertness: Awake/alert Behavior During Therapy: WFL for tasks assessed/performed Overall Cognitive Status: Within Functional Limits for tasks assessed         General Comments      Exercises Total Joint Exercises Ankle Circles/Pumps: AROM;10 reps;Supine;Both Quad Sets: AROM;5 reps;Supine;Left Short Arc Quad: AROM;5 reps;Supine;Left Heel Slides: AROM;5 reps;Supine;Left Hip ABduction/ADduction: AROM;5 reps;Supine;Left Long Arc Quad: AROM;5 reps;Seated;Left Other Exercises Other Exercises: Eduacted on standing exercise for HEP: marching, hip extension, hip abduction, knee flexion. educated pt to perfrm at [employer] for bil UE support.   Assessment/Plan    PT Assessment Patient needs continued PT services  PT Problem List Decreased strength;Decreased balance;Decreased range of motion;Decreased mobility;Decreased activity tolerance;Decreased knowledge of use of DME       PT Treatment Interventions DME instruction;Functional mobility training;Balance training;Patient/family education;Gait training;Therapeutic activities;Therapeutic exercise;Stair training    PT Goals (Current goals can be found in the Care Plan section)  Acute Rehab PT Goals Patient Stated Goal: to get home PT Goal Formulation: With patient Time For Goal Achievement: 06/02/19 Potential to Achieve Goals: Good    Frequency 7X/week    AM-PAC PT "6 Clicks" Mobility  Outcome Measure Help needed turning from your back to your side while in a flat bed without using  bedrails?: None Help needed moving from lying on your back to sitting on the side of a flat bed without using bedrails?: None Help needed moving to and from a bed to a chair (including a wheelchair)?: A Little Help needed standing up from a chair using your arms (e.g., wheelchair or bedside chair)?: A Little Help needed to walk in hospital room?: A Little Help needed climbing 3-5 steps with a railing? : A Little 6 Click Score: 20    End of Session Equipment Utilized During Treatment: Gait belt Activity Tolerance: Patient tolerated treatment well Patient left: with call bell/phone within reach;in chair Nurse Communication: Mobility status PT Visit Diagnosis: Muscle weakness (generalized) (M62.81);Difficulty in walking, not elsewhere classified (R26.2)    Time: 7412-8786 PT Time Calculation (min) (ACUTE ONLY): 30 min   Charges:   PT Evaluation $PT Eval Low Complexity: 1 Low PT Treatments $Gait Training: 8-22 mins        Gwynneth Albright, PT, DPT Physical Therapist with [facility]  05/29/2019 1:32 PM

## 2019-05-29 NOTE — Anesthesia Preprocedure Evaluation (Signed)
Anesthesia Evaluation  Patient identified by MRN, date of birth, ID band Patient awake    Reviewed: Allergy & Precautions, NPO status , Patient's Chart, lab work & pertinent test results  Airway Mallampati: I       Dental no notable dental hx. (+) Teeth Intact   Pulmonary neg pulmonary ROS,    Pulmonary exam normal        Cardiovascular negative cardio ROS Normal cardiovascular exam     Neuro/Psych negative neurological ROS  negative psych ROS   GI/Hepatic Neg liver ROS,   Endo/Other  negative endocrine ROS  Renal/GU negative Renal ROS  negative genitourinary   Musculoskeletal  (+) Arthritis , Osteoarthritis,    Abdominal Normal abdominal exam  (+)   Peds  Hematology negative hematology ROS (+)   Anesthesia Other Findings   Reproductive/Obstetrics                             Anesthesia Physical Anesthesia Plan  ASA: I  Anesthesia Plan: Spinal   Post-op Pain Management:    Induction:   PONV Risk Score and Plan: 1 and Ondansetron and Dexamethasone  Airway Management Planned: Natural Airway, Nasal Cannula and Simple Face Mask  Additional Equipment: None  Intra-op Plan:   Post-operative Plan:   Informed Consent: I have reviewed the patients History and Physical, chart, labs and discussed the procedure including the risks, benefits and alternatives for the proposed anesthesia with the patient or authorized representative who has indicated his/her understanding and acceptance.       Plan Discussed with: CRNA  Anesthesia Plan Comments:         Anesthesia Quick Evaluation

## 2019-05-29 NOTE — Discharge Instructions (Signed)
Dr. Gaynelle Arabian Total Joint Specialist [facility] [address] [place], [place] [address] [phone]  ANTERIOR APPROACH TOTAL HIP REPLACEMENT POSTOPERATIVE DIRECTIONS   Hip Rehabilitation, Guidelines Following Surgery  The results of a hip operation are greatly improved after range of motion and muscle strengthening exercises. Follow all safety measures which are given to protect your hip. If any of these exercises cause increased pain or swelling in your joint, decrease the amount until you are comfortable again. Then slowly increase the exercises. Call your caregiver if you have problems or questions.   HOME CARE INSTRUCTIONS  . Remove items at home which could result in a fall. This includes throw rugs or furniture in walking pathways.   ICE to the affected hip every three hours for 30 minutes at a time and then as needed for pain and swelling.  Continue to use ice on the hip for pain and swelling from surgery. You may notice swelling that will progress down to the foot and ankle.  This is normal after surgery.  Elevate the leg when you are not up walking on it.    Continue to use the breathing machine which will help keep your temperature down.  It is common for your temperature to cycle up and down following surgery, especially at night when you are not up moving around and exerting yourself.  The breathing machine keeps your lungs expanded and your temperature down.  DIET You may resume your previous home diet once your are discharged from the hospital.  DRESSING / WOUND CARE / SHOWERING Leave adhesive bandage in place for one week following surgery. Remove bandage after one week and leave steri-strips in place until follow-up appointment.  You may shower 3 days following surgery. Do not submerge the incision under water.   ACTIVITY Walk with your walker as instructed. Use walker as long as suggested by your caregivers. Avoid periods of inactivity such as  sitting longer than an hour when not asleep. This helps prevent blood clots.  You may resume a sexual relationship in one month or when given the OK by your doctor.  You may return to work once you are cleared by your doctor.  Do not drive a car for 6 weeks or until released by you surgeon.  Do not drive while taking narcotics.  WEIGHT BEARING Weight bearing as tolerated with assist device (walker, cane, etc) as directed, use it as long as suggested by your surgeon or therapist, typically at least 4-6 weeks.  POSTOPERATIVE CONSTIPATION PROTOCOL Constipation - defined medically as fewer than three stools per week and severe constipation as less than one stool per week.  One of the most common issues patients have following surgery is constipation.  Even if you have a regular bowel pattern at home, your normal regimen is likely to be disrupted due to multiple reasons following surgery.  Combination of anesthesia, postoperative narcotics, change in appetite and fluid intake all can affect your bowels.  In order to avoid complications following surgery, here are some recommendations in order to help you during your recovery period.  Colace (docusate) - Pick up an over-the-counter form of Colace or another stool softener and take twice a day as long as you are requiring postoperative pain medications.  Take with a full glass of water daily.  If you experience loose stools or diarrhea, hold the colace until you stool forms back up.  If your symptoms do not get better within 1 week or if they  get worse, check with your doctor.  Dulcolax (bisacodyl) - Pick up over-the-counter and take as directed by the product packaging as needed to assist with the movement of your bowels.  Take with a full glass of water.  Use this product as needed if not relieved by Colace only.   MiraLax (polyethylene glycol) - Pick up over-the-counter to have on hand.  MiraLax is a solution that will increase the amount of water in  your bowels to assist with bowel movements.  Take as directed and can mix with a glass of water, juice, soda, coffee, or tea.  Take if you go more than two days without a movement. Do not use MiraLax more than once per day. Call your doctor if you are still constipated or irregular after using this medication for 7 days in a row.  If you continue to have problems with postoperative constipation, please contact the office for further assistance and recommendations.  If you experience "the worst abdominal pain ever" or develop nausea or vomiting, please contact the office immediatly for further recommendations for treatment.  ITCHING  If you experience itching with your medications, try taking only a single pain pill, or even half a pain pill at a time.  You can also use Benadryl over the counter for itching or also to help with sleep.   TED HOSE STOCKINGS Wear the elastic stockings on both legs for three weeks following surgery during the day but you may remove then at night for sleeping.  MEDICATIONS See your medication summary on the "After Visit Summary" that the nursing staff will review with you prior to discharge.  You may have some home medications which will be placed on hold until you complete the course of blood thinner medication.  It is important for you to complete the blood thinner medication as prescribed by your surgeon.  Continue your approved medications as instructed at time of discharge.  PRECAUTIONS If you experience chest pain or shortness of breath - call 911 immediately for transfer to the hospital emergency department.  If you develop a fever greater that 101 F, purulent drainage from wound, increased redness or drainage from wound, foul odor from the wound/dressing, or calf pain - CONTACT YOUR SURGEON.                                                   FOLLOW-UP APPOINTMENTS Make sure you keep all of your appointments after your operation with your surgeon and caregivers.  You should call the office at the above phone number and make an appointment for approximately two weeks after the date of your surgery or on the date instructed by your surgeon outlined in the "After Visit Summary".  RANGE OF MOTION AND STRENGTHENING EXERCISES  These exercises are designed to help you keep full movement of your hip joint. Follow your caregiver's or physical therapist's instructions. Perform all exercises about fifteen times, three times per day or as directed. Exercise both hips, even if you have had only one joint replacement. These exercises can be done on a training (exercise) mat, on the floor, on a table or on a bed. Use whatever works the best and is most comfortable for you. Use music or television while you are exercising so that the exercises are a pleasant break in your day. This will make your life  better with the exercises acting as a break in routine you can look forward to.  . Lying on your back, slowly slide your foot toward your buttocks, raising your knee up off the floor. Then slowly slide your foot back down until your leg is straight again.  . Lying on your back spread your legs as far apart as you can without causing discomfort.  . Lying on your side, raise your upper leg and foot straight up from the floor as far as is comfortable. Slowly lower the leg and repeat.  . Lying on your back, tighten up the muscle in the front of your thigh (quadriceps muscles). You can do this by keeping your leg straight and trying to raise your heel off the floor. This helps strengthen the largest muscle supporting your knee.  . Lying on your back, tighten up the muscles of your buttocks both with the legs straight and with the knee bent at a comfortable angle while keeping your heel on the floor.   IF YOU ARE TRANSFERRED TO A SKILLED REHAB FACILITY If the patient is transferred to a skilled rehab facility following release from the hospital, a list of the current medications will be  sent to the facility for the patient to continue.  When discharged from the skilled rehab facility, please have the facility set up the patient's [place] prior to being released. Also, the skilled facility will be responsible for providing the patient with their medications at time of release from the facility to include their pain medication, the muscle relaxants, and their blood thinner medication. If the patient is still at the rehab facility at time of the two week follow up appointment, the skilled rehab facility will also need to assist the patient in arranging follow up appointment in our office and any transportation needs.  MAKE SURE YOU:  . Understand these instructions.  . Get help right away if you are not doing well or get worse.    Pick up stool softner and laxative for home use following surgery while on pain medications. Do not submerge incision under water. Please use good hand washing techniques while changing dressing each day. May shower starting three days after surgery. Please use a clean towel to pat the incision dry following showers. Continue to use ice for pain and swelling after surgery. Do not use any lotions or creams on the incision until instructed by your surgeon.

## 2019-06-03 ENCOUNTER — Encounter: Payer: Self-pay | Admitting: *Deleted

## 2019-06-03 NOTE — Discharge Summary (Signed)
Physician Discharge Summary   Patient ID: Rodney Bailey. MRN: [id number] DOB/AGE: 1951/01/04 68 y.o.  Admit date: 05/29/2019 Discharge date: 05/29/2019  Primary Diagnosis: Osteoarthritis, left hip   Admission Diagnoses:  Past Medical History:  Diagnosis Date  . Allergy   . Arthritis    Discharge Diagnoses:   Principal Problem:   OA (osteoarthritis) of hip Active Problems:   Other specified arthritis, left hip  Estimated body mass index is 26.87 kg/m as calculated from the following:   Height as of this encounter: 5\' 6"  (1.676 m).   Weight as of this encounter: 75.5 kg.  Procedure:  Procedure(s) (LRB): TOTAL HIP ARTHROPLASTY ANTERIOR APPROACH SAME DAY DISCHARGE (Left)   Consults: None  HPI: Rodney Weisenbach. is a 67 y.o. male who has advanced end-stage arthritis of their Left  hip with progressively worsening pain and dysfunction.The patient has failed nonoperative management and presents for total hip arthroplasty.   Laboratory Data: Hospital Outpatient Visit on 05/27/2019  Component Date Value Ref Range Status  . aPTT 05/27/2019 41* 24 - 36 seconds Final   Comment:        IF BASELINE aPTT IS ELEVATED, SUGGEST PATIENT RISK ASSESSMENT BE USED TO DETERMINE APPROPRIATE ANTICOAGULANT THERAPY. Performed at [facility], [place] [address] [place], [place] [address]   . WBC 05/27/2019 6.3  4.0 - 10.5 K/uL Final  . RBC 05/27/2019 4.94  4.22 - 5.81 MIL/uL Final  . Hemoglobin 05/27/2019 15.2  13.0 - 17.0 g/dL Final  . HCT 05/27/2019 47.9  39.0 - 52.0 % Final  . MCV 05/27/2019 97.0  80.0 - 100.0 fL Final  . MCH 05/27/2019 30.8  26.0 - 34.0 pg Final  . MCHC 05/27/2019 31.7  30.0 - 36.0 g/dL Final  . RDW 05/27/2019 12.2  11.5 - 15.5 % Final  . Platelets 05/27/2019 250  150 - 400 K/uL Final  . nRBC 05/27/2019 0.0  0.0 - 0.2 % Final  . Neutrophils Relative % 05/27/2019 67  % Final  . Neutro Abs 05/27/2019 4.3  1.7 - 7.7 K/uL Final  . Lymphocytes  Relative 05/27/2019 20  % Final  . Lymphs Abs 05/27/2019 1.3  0.7 - 4.0 K/uL Final  . Monocytes Relative 05/27/2019 10  % Final  . Monocytes Absolute 05/27/2019 0.6  0.1 - 1.0 K/uL Final  . Eosinophils Relative 05/27/2019 1  % Final  . Eosinophils Absolute 05/27/2019 0.1  0.0 - 0.5 K/uL Final  . Basophils Relative 05/27/2019 1  % Final  . Basophils Absolute 05/27/2019 0.0  0.0 - 0.1 K/uL Final  . Immature Granulocytes 05/27/2019 1  % Final  . Abs Immature Granulocytes 05/27/2019 0.06  0.00 - 0.07 K/uL Final   Performed at [facility], [place] [address] [place], [place] [address]  . Sodium 05/27/2019 141  135 - 145 mmol/L Final  . Potassium 05/27/2019 4.3  3.5 - 5.1 mmol/L Final  . Chloride 05/27/2019 106  98 - 111 mmol/L Final  . CO2 05/27/2019 27  22 - 32 mmol/L Final  . Glucose, Bld 05/27/2019 96  70 - 99 mg/dL Final  . BUN 05/27/2019 32* 8 - 23 mg/dL Final  . Creatinine, Ser 05/27/2019 1.20  0.61 - 1.24 mg/dL Final  . Calcium 05/27/2019 9.5  8.9 - 10.3 mg/dL Final  . Total Protein 05/27/2019 6.7  6.5 - 8.1 g/dL Final  . Albumin 05/27/2019 4.1  3.5 - 5.0 g/dL Final  . AST 05/27/2019 23  15 -  41 U/L Final  . ALT 05/27/2019 20  0 - 44 U/L Final  . Alkaline Phosphatase 05/27/2019 60  38 - 126 U/L Final  . Total Bilirubin 05/27/2019 2.1* 0.3 - 1.2 mg/dL Final  . GFR calc non Af Amer 05/27/2019 >60  >60 mL/min Final  . GFR calc Af Amer 05/27/2019 >60  >60 mL/min Final  . Anion gap 05/27/2019 8  5 - 15 Final   Performed at [facility], [place] [address], [place], [place] [address]  . Prothrombin Time 05/27/2019 13.5  11.4 - 15.2 seconds Final  . INR 05/27/2019 1.0  0.8 - 1.2 Final   Comment: (NOTE) INR goal varies based on device and disease states. Performed at [facility], [place] [address] [place], [place] [address]   . ABO/RH(D) 05/27/2019 O POS   Final  . Antibody Screen 05/27/2019 NEG   Final  . Sample Expiration 05/27/2019  06/01/2019,2359   Final  . Extend sample reason 05/27/2019    Final                   Value:NO TRANSFUSIONS OR PREGNANCY IN THE PAST 3 MONTHS Performed at [place] [address] [place], [place] [address]   . MRSA, PCR 05/27/2019 NEGATIVE  NEGATIVE Final  . Staphylococcus aureus 05/27/2019 NEGATIVE  NEGATIVE Final   Comment: (NOTE) The Xpert SA Assay (FDA approved for NASAL specimens in patients 6 years of age and older), is one component of a comprehensive surveillance program. It is not intended to diagnose infection nor to guide or monitor treatment. Performed at [facility], [place] [address] [place], [place] [address]   . ABO/RH(D) 05/27/2019    Final                   Value:O POS Performed at [facility], [place] [address] [place], [place] [address]   Hospital Outpatient Visit on 05/25/2019  Component Date Value Ref Range Status  . SARS-CoV-2, NAA 05/25/2019 NOT DETECTED  NOT DETECTED Final   Comment: (NOTE) This nucleic acid amplification test was developed and its performance characteristics determined by [employer]. Nucleic acid amplification tests include PCR and TMA. This test has not been FDA cleared or approved. This test has been authorized by FDA under an Emergency Use Authorization (EUA). This test is only authorized for the duration of time the declaration that circumstances exist justifying the authorization of the emergency use of in vitro diagnostic tests for detection of SARS-CoV-2 virus and/or diagnosis of COVID-19 infection under section 564(b)(1) of the Act, 21 U.S.C. GF:7541899) (1), unless the authorization is terminated or revoked sooner. When diagnostic testing is negative, the possibility of a false negative result should be considered in the context of a patient's recent exposures and the presence of clinical signs and symptoms consistent with COVID-19. An individual without  symptoms of COVID- 19 and who is not shedding SARS-CoV-2 vi                          rus would expect to have a negative (not detected) result in this assay. Performed At: [facility] [place], [place] [id number] Rush Farmer MD [id number]   . Coronavirus Source 05/25/2019 NASOPHARYNGEAL   Final   Performed at [facility], [place] [address] [place],  [address]  Office Visit on 05/03/2019  Component Date Value Ref Range Status  .  Glucose 05/03/2019 92  65 - 99 mg/dL Final  . BUN 05/03/2019 25  8 - 27 mg/dL Final  . Creatinine, Ser 05/03/2019 1.36* 0.76 - 1.27 mg/dL Final  . GFR calc non Af Amer 05/03/2019 53* >59 mL/min/1.73 Final  . GFR calc Af Amer 05/03/2019 62  >59 mL/min/1.73 Final  . BUN/Creatinine Ratio 05/03/2019 18  10 - 24 Final  . Sodium 05/03/2019 141  134 - 144 mmol/L Final  . Potassium 05/03/2019 4.8  3.5 - 5.2 mmol/L Final  . Chloride 05/03/2019 105  96 - 106 mmol/L Final  . CO2 05/03/2019 21  20 - 29 mmol/L Final  . Calcium 05/03/2019 9.6  8.6 - 10.2 mg/dL Final  . Total Protein 05/03/2019 6.5  6.0 - 8.5 g/dL Final  . Albumin 05/03/2019 3.9  3.8 - 4.8 g/dL Final  . Globulin, Total 05/03/2019 2.6  1.5 - 4.5 g/dL Final  . Albumin/Globulin Ratio 05/03/2019 1.5  1.2 - 2.2 Final  . Bilirubin Total 05/03/2019 1.1  0.0 - 1.2 mg/dL Final  . Alkaline Phosphatase 05/03/2019 87  39 - 117 IU/L Final  . AST 05/03/2019 22  0 - 40 IU/L Final  . ALT 05/03/2019 24  0 - 44 IU/L Final  . Cholesterol, Total 05/03/2019 183  100 - 199 mg/dL Final  . Triglycerides 05/03/2019 51  0 - 149 mg/dL Final  . HDL 05/03/2019 49  >39 mg/dL Final  . VLDL Cholesterol Cal 05/03/2019 10  5 - 40 mg/dL Final  . LDL Chol Calc (NIH) 05/03/2019 124* 0 - 99 mg/dL Final  . Chol/HDL Ratio 05/03/2019 3.7  0.0 - 5.0 ratio Final   Comment:                                   T. Chol/HDL Ratio                                             Men  Women                                1/2 Avg.Risk  3.4    3.3                                   Avg.Risk  5.0    4.4                                2X Avg.Risk  9.6    7.1                                3X Avg.Risk 23.4   11.0   . WBC 05/03/2019 5.5  3.4 - 10.8 x10E3/uL Final  . RBC 05/03/2019 4.79  4.14 - 5.80 x10E6/uL Final  . Hemoglobin 05/03/2019 15.0  13.0 - 17.7 g/dL Final  . Hematocrit 05/03/2019 45.4  37.5 - 51.0 % Final  . MCV 05/03/2019 95  79 - 97 fL Final  . MCH 05/03/2019 31.3  26.6 - 33.0 pg Final  . MCHC 05/03/2019 33.0  31.5 -  35.7 g/dL Final  . RDW 05/03/2019 12.2  11.6 - 15.4 % Final  . Platelets 05/03/2019 236  150 - 450 x10E3/uL Final  . Hgb A1c MFr Bld 05/03/2019 5.5  4.8 - 5.6 % Final   Comment:          Prediabetes: 5.7 - 6.4          Diabetes: >6.4          Glycemic control for adults with diabetes: <7.0   . Est. average glucose Bld gHb Est-m* 05/03/2019 111  mg/dL Final  . Color, UA 05/03/2019 yellow  yellow Final  . Clarity, UA 05/03/2019 clear  clear Final  . Glucose, UA 05/03/2019 negative  negative mg/dL Final  . Bilirubin, UA 05/03/2019 negative  negative Final  . Ketones, POC UA 05/03/2019 negative  negative mg/dL Final  . Spec Grav, UA 05/03/2019 1.025  1.010 - 1.025 Final  . Blood, UA 05/03/2019 small* negative Final  . pH, UA 05/03/2019 5.0  5.0 - 8.0 Final  . Protein Ur, POC 05/03/2019 negative  negative mg/dL Final  . Urobilinogen, UA 05/03/2019 0.2  0.2 or 1.0 E.U./dL Final  . Nitrite, UA 05/03/2019 Negative  Negative Final  . Leukocytes, UA 05/03/2019 Negative  Negative Final     X-Rays:DG Chest 2 View  Result Date: 05/06/2019 CLINICAL DATA:  Preoperative clearance for hip replacement. EXAM: CHEST - 2 VIEW COMPARISON:  None. FINDINGS: Both lungs are clear. Heart and mediastinum are within normal limits. Trachea is midline. No large pleural effusions. Mild degenerative changes in the lower thoracic spine. IMPRESSION: No active cardiopulmonary disease. Electronically Signed    By: Markus Daft M.D.   On: 05/06/2019 08:35   DG Pelvis Portable  Result Date: 05/29/2019 CLINICAL DATA:  Left hip replacement EXAM: PORTABLE PELVIS 1-2 VIEWS COMPARISON:  None. FINDINGS: Post left total hip arthroplasty. No evidence of complication on this single view. There is osteoarthritis at the right hip. IMPRESSION: Post left total hip arthroplasty. Electronically Signed   By: Macy Mis M.D.   On: 05/29/2019 09:54   DG C-Arm 1-60 Min-No Report  Result Date: 05/29/2019 CLINICAL DATA:  Left hip replacement EXAM: OPERATIVE LEFT HIP (WITH PELVIS IF PERFORMED) 4 VIEWS TECHNIQUE: Fluoroscopic spot image(s) were submitted for interpretation post-operatively. COMPARISON:  None. FINDINGS: Changes of left hip replacement. Normal AP alignment. No hardware bony complicating feature. IMPRESSION: Left hip replacement.  No visible complicating feature. Electronically Signed   By: Rolm Baptise M.D.   On: 05/29/2019 09:00   DG HIP OPERATIVE UNILAT W OR W/O PELVIS LEFT  Result Date: 05/29/2019 CLINICAL DATA:  Left hip replacement EXAM: OPERATIVE LEFT HIP (WITH PELVIS IF PERFORMED) 4 VIEWS TECHNIQUE: Fluoroscopic spot image(s) were submitted for interpretation post-operatively. COMPARISON:  None. FINDINGS: Changes of left hip replacement. Normal AP alignment. No hardware bony complicating feature. IMPRESSION: Left hip replacement.  No visible complicating feature. Electronically Signed   By: Rolm Baptise M.D.   On: 05/29/2019 09:00    EKG: Orders placed or performed in visit on 05/03/19  . EKG 12-Lead     Hospital Course: Rodney Agerton. is a 68 y.o. who was admitted to [facility] They were brought to the operating room on 05/29/2019 and underwent Procedure(s): [place].  Patient tolerated the procedure well and was later transferred to the recovery room for postoperative care. They were given PO and IV analgesics for pain control  following their surgery. They  were given 24 hours of postoperative antibiotics of  Anti-infectives (From admission, onward)   Start     Dose/Rate Route Frequency Ordered Stop   05/29/19 1358  ceFAZolin (ANCEF) 2-4 GM/100ML-% IVPB  Status:  Discontinued    Note to Pharmacy: Mickel Baas: cabinet override      05/29/19 1358 05/29/19 2038   05/29/19 1330  ceFAZolin (ANCEF) IVPB 2g/100 mL premix  Status:  Discontinued     2 g 200 mL/hr over 30 Minutes Intravenous Every 6 hours 05/29/19 0902 05/29/19 2038   05/29/19 0600  ceFAZolin (ANCEF) IVPB 2g/100 mL premix     2 g 200 mL/hr over 30 Minutes Intravenous On call to O.R. 05/29/19 [id number] 05/29/19 0731     and started on DVT prophylaxis in the form of Aspirin. Physical therapy was ordered for total joint protocol. Patient received three normal saline boluses in recovery and completed a session of physical therapy. Pain was well controlled with medications, patient was able to void on his own, and was meeting his goals with therapy. Pt was discharged to home on a same day discharge in stable condition.  Diet: Regular diet Activity: WBAT Follow-up: in 2 weeks Disposition: Home with HEP Discharged Condition: stable   Discharge Instructions    Call MD / Call 911   Complete by: As directed    If you experience chest pain or shortness of breath, CALL 911 and be transported to the hospital emergency room.  If you develope a fever above 101 F, pus (white drainage) or increased drainage or redness at the wound, or calf pain, call your surgeon's office.   Call MD / Call 911   Complete by: As directed    If you experience chest pain or shortness of breath, CALL 911 and be transported to the hospital emergency room.  If you develope a fever above 101 F, pus (white drainage) or increased drainage or redness at the wound, or calf pain, call your surgeon's office.   Change dressing   Complete by: As directed    You may remove the Aquacell dressing in  1 week.   Constipation Prevention   Complete by: As directed    Drink plenty of fluids.  Prune juice may be helpful.  You may use a stool softener, such as Colace (over the counter) 100 mg twice a day.  Use MiraLax (over the counter) for constipation as needed.   Constipation Prevention   Complete by: As directed    Drink plenty of fluids.  Prune juice may be helpful.  You may use a stool softener, such as Colace (over the counter) 100 mg twice a day.  Use MiraLax (over the counter) for constipation as needed.   Diet - low sodium heart healthy   Complete by: As directed    Diet - low sodium heart healthy   Complete by: As directed    Discharge instructions   Complete by: As directed    Dr. Gaynelle Arabian Total Joint Specialist [facility] [address], [place], [place] [address] [phone]  ANTERIOR APPROACH TOTAL HIP REPLACEMENT POSTOPERATIVE DIRECTIONS   Hip Rehabilitation, Guidelines Following Surgery  The results of a hip operation are greatly improved after range of motion and muscle strengthening exercises. Follow all safety measures which are given to protect your hip. If any of these exercises cause increased pain or swelling in your joint, decrease the amount until you are comfortable again. Then slowly increase the exercises. Call your caregiver if  you have problems or questions.   HOME CARE INSTRUCTIONS  Remove items at home which could result in a fall. This includes throw rugs or furniture in walking pathways.  ICE to the affected hip every three hours for 30 minutes at a time and then as needed for pain and swelling.  Continue to use ice on the hip for pain and swelling from surgery. You may notice swelling that will progress down to the foot and ankle.  This is normal after surgery.  Elevate the leg when you are not up walking on it.   Continue to use the breathing machine which will help keep your temperature down.  It is common for your temperature to  cycle up and down following surgery, especially at night when you are not up moving around and exerting yourself.  The breathing machine keeps your lungs expanded and your temperature down.  DIET You may resume your previous home diet once your are discharged from the hospital.   ACTIVITY Walk with your walker as instructed. Use walker as long as suggested by your caregivers. Avoid periods of inactivity such as sitting longer than an hour when not asleep. This helps prevent blood clots.  You may resume a sexual relationship in one month or when given the OK by your doctor.  You may return to work once you are cleared by your doctor.  Do not drive a car for 6 weeks or until released by you surgeon.  Do not drive while taking narcotics.  WEIGHT BEARING Weight bearing as tolerated with assist device (walker, cane, etc) as directed, use it as long as suggested by your surgeon or therapist, typically at least 4-6 weeks.  POSTOPERATIVE CONSTIPATION PROTOCOL Constipation - defined medically as fewer than three stools per week and severe constipation as less than one stool per week.  One of the most common issues patients have following surgery is constipation.  Even if you have a regular bowel pattern at home, your normal regimen is likely to be disrupted due to multiple reasons following surgery.  Combination of anesthesia, postoperative narcotics, change in appetite and fluid intake all can affect your bowels.  In order to avoid complications following surgery, here are some recommendations in order to help you during your recovery period.  Colace (docusate) - Pick up an over-the-counter form of Colace or another stool softener and take twice a day as long as you are requiring postoperative pain medications.  Take with a full glass of water daily.  If you experience loose stools or diarrhea, hold the colace until you stool forms back up.  If your symptoms do not get better within 1 week or if they  get worse, check with your doctor.  Dulcolax (bisacodyl) - Pick up over-the-counter and take as directed by the product packaging as needed to assist with the movement of your bowels.  Take with a full glass of water.  Use this product as needed if not relieved by Colace only.   MiraLax (polyethylene glycol) - Pick up over-the-counter to have on hand.  MiraLax is a solution that will increase the amount of water in your bowels to assist with bowel movements.  Take as directed and can mix with a glass of water, juice, soda, coffee, or tea.  Take if you go more than two days without a movement. Do not use MiraLax more than once per day. Call your doctor if you are still constipated or irregular after using this medication for 7 days  in a row.  If you continue to have problems with postoperative constipation, please contact the office for further assistance and recommendations.  If you experience "the worst abdominal pain ever" or develop nausea or vomiting, please contact the office immediatly for further recommendations for treatment.  ITCHING  If you experience itching with your medications, try taking only a single pain pill, or even half a pain pill at a time.  You can also use Benadryl over the counter for itching or also to help with sleep.   TED HOSE STOCKINGS Wear the elastic stockings on both legs for three weeks following surgery during the day but you may remove then at night for sleeping.  MEDICATIONS See your medication summary on the "After Visit Summary" that the nursing staff will review with you prior to discharge.  You may have some home medications which will be placed on hold until you complete the course of blood thinner medication.  It is important for you to complete the blood thinner medication as prescribed by your surgeon.  Continue your approved medications as instructed at time of discharge.  PRECAUTIONS If you experience chest pain or shortness of breath - call 911  immediately for transfer to the hospital emergency department.  If you develop a fever greater that 101 F, purulent drainage from wound, increased redness or drainage from wound, foul odor from the wound/dressing, or calf pain - CONTACT YOUR SURGEON.                                                   FOLLOW-UP APPOINTMENTS Make sure you keep all of your appointments after your operation with your surgeon and caregivers. You should call the office at the above phone number and make an appointment for approximately two weeks after the date of your surgery or on the date instructed by your surgeon outlined in the "After Visit Summary".  RANGE OF MOTION AND STRENGTHENING EXERCISES  These exercises are designed to help you keep full movement of your hip joint. Follow your caregiver's or physical therapist's instructions. Perform all exercises about fifteen times, three times per day or as directed. Exercise both hips, even if you have had only one joint replacement. These exercises can be done on a training (exercise) mat, on the floor, on a table or on a bed. Use whatever works the best and is most comfortable for you. Use music or television while you are exercising so that the exercises are a pleasant break in your day. This will make your life better with the exercises acting as a break in routine you can look forward to.  Lying on your back, slowly slide your foot toward your buttocks, raising your knee up off the floor. Then slowly slide your foot back down until your leg is straight again.  Lying on your back spread your legs as far apart as you can without causing discomfort.  Lying on your side, raise your upper leg and foot straight up from the floor as far as is comfortable. Slowly lower the leg and repeat.  Lying on your back, tighten up the muscle in the front of your thigh (quadriceps muscles). You can do this by keeping your leg straight and trying to raise your heel off the floor. This helps  strengthen the largest muscle supporting your knee.  Lying on your  back, tighten up the muscles of your buttocks both with the legs straight and with the knee bent at a comfortable angle while keeping your heel on the floor.   IF YOU ARE TRANSFERRED TO A SKILLED REHAB FACILITY If the patient is transferred to a skilled rehab facility following release from the hospital, a list of the current medications will be sent to the facility for the patient to continue.  When discharged from the skilled rehab facility, please have the facility set up the patient's [place] prior to being released. Also, the skilled facility will be responsible for providing the patient with their medications at time of release from the facility to include their pain medication, the muscle relaxants, and their blood thinner medication. If the patient is still at the rehab facility at time of the two week follow up appointment, the skilled rehab facility will also need to assist the patient in arranging follow up appointment in our office and any transportation needs.  MAKE SURE YOU:  Understand these instructions.  Get help right away if you are not doing well or get worse.    Pick up stool softner and laxative for home use following surgery while on pain medications. Do not submerge incision under water. Please use good hand washing techniques while changing dressing each day. May shower starting three days after surgery. Please use a clean towel to pat the incision dry following showers. Continue to use ice for pain and swelling after surgery. Do not use any lotions or creams on the incision until instructed by your surgeon.   Do not sit on low chairs, stoools or toilet seats, as it may be difficult to get up from low surfaces   Complete by: As directed    Do not sit on low chairs, stoools or toilet seats, as it may be difficult to get up from low surfaces   Complete by: As directed    Driving  restrictions   Complete by: As directed    No driving for two weeks   Driving restrictions   Complete by: As directed    No driving for two weeks   TED hose   Complete by: As directed    Use stockings (TED hose) for three weeks on both leg(s).  You may remove them at night for sleeping.   TED hose   Complete by: As directed    Use stockings (TED hose) for three weeks on both leg(s).  You may remove them at night for sleeping.   Weight bearing as tolerated   Complete by: As directed    Weight bearing as tolerated   Complete by: As directed      Allergies as of 05/29/2019   No Known Allergies     Medication List    TAKE these medications   acetaminophen 500 MG tablet Commonly known as: TYLENOL Take 500 mg by mouth every 6 (six) hours as needed for mild pain or moderate pain.   aspirin EC 325 MG tablet Take 1 tablet (325 mg total) by mouth 2 (two) times daily for 21 days. Then take one 81 mg aspirin once a day for three weeks.   atorvastatin 10 MG tablet Commonly known as: LIPITOR Take 1 tablet (10 mg total) by mouth daily. Can initially start QOD to make sure tolerated. What changed:   how much to take  when to take this  additional instructions   azelastine 0.1 % nasal spray Commonly known as: Astelin Place  1 spray into the nose 2 (two) times daily. Use in each nostril as directed   Benfotiamine 150 MG Caps Take 150 mg by mouth daily.   GLUCOSAMINE PO Take 1,500 mg by mouth daily.   HYDROcodone-acetaminophen 5-325 MG tablet Commonly known as: NORCO/VICODIN Take 1-2 tablets by mouth every 6 (six) hours as needed for severe pain.   KRILL OIL PO Take 220 mg by mouth daily.   Magnesium 400 MG Tabs Take 400 mg by mouth 2 (two) times daily.   Melatonin 5 MG Tabs Take 5-10 mg by mouth at bedtime as needed (sleep).   methocarbamol 500 MG tablet Commonly known as: Robaxin Take 1 tablet (500 mg total) by mouth every 6 (six) hours as needed for muscle spasms.    OVER THE COUNTER MEDICATION Take 280 mg by mouth daily. Horsetail   SAW PALMETTO (SERENOA REPENS) PO Take 800 mg by mouth daily.   traMADol 50 MG tablet Commonly known as: Ultram Take 1 tablet (50 mg total) by mouth every 6 (six) hours as needed for moderate pain.   traZODone 100 MG tablet Commonly known as: DESYREL TAKE HALF TO ONE TABLET BY MOUTH AT BEDTIME AS NEEDED FOR SLEEP What changed:   how much to take  how to take this  when to take this  additional instructions   TURMERIC PO Take 1,200 mg by mouth daily.   vitamin C 1000 MG tablet Take 1,000 mg by mouth daily.   Vitamin D-3 125 MCG (5000 UT) Tabs Take 5,000 Units by mouth daily.            Discharge Care Instructions  (From admission, onward)         Start     Ordered   05/29/19 0000  Weight bearing as tolerated     05/29/19 1030   05/29/19 0000  Weight bearing as tolerated     05/29/19 1605   05/29/19 0000  Change dressing    Comments: You may remove the Aquacell dressing in 1 week.   05/29/19 1605         Follow-up Information    Gaynelle Arabian, MD. Schedule an appointment as soon as possible for a visit on 06/13/2019.   Specialty: Orthopedic Surgery Contact information: [address] [place] [address] [id number]           Signed: Theresa Duty, PA-C Orthopedic Surgery 06/03/2019, 12:00 PM

## 2019-07-02 DIAGNOSIS — M25552 Pain in left hip: Secondary | ICD-10-CM | POA: Diagnosis not present

## 2019-07-02 DIAGNOSIS — Z96642 Presence of left artificial hip joint: Secondary | ICD-10-CM | POA: Diagnosis not present

## 2019-07-02 DIAGNOSIS — Z471 Aftercare following joint replacement surgery: Secondary | ICD-10-CM | POA: Diagnosis not present

## 2019-07-05 ENCOUNTER — Ambulatory Visit: Payer: Medicare HMO

## 2019-07-13 ENCOUNTER — Ambulatory Visit: Payer: Medicare HMO | Attending: Internal Medicine

## 2019-07-15 ENCOUNTER — Ambulatory Visit: Payer: Medicare HMO | Attending: Internal Medicine

## 2019-07-15 DIAGNOSIS — Z23 Encounter for immunization: Secondary | ICD-10-CM | POA: Insufficient documentation

## 2019-07-15 NOTE — Progress Notes (Signed)
   [id number]  Name:  Rodney Bailey.    MRN: [id number] DOB: January 11, 1951  07/15/2019  Mr. Marks was observed post Covid-19 immunization for 15 minutes without incidence. He was provided with Vaccine Information Sheet and instruction to access the V-Safe system.   [name] was instructed to call 911 with any severe reactions post vaccine: Marland Kitchen Difficulty breathing  . Swelling of your face and throat  . A fast heartbeat  . A bad rash all over your body  . Dizziness and weakness    Immunizations Administered    Name Date Dose VIS Date Route   Pfizer COVID-19 Vaccine 07/15/2019  5:58 PM 0.3 mL 05/17/2019 Intramuscular   Manufacturer: [place]   Lot: [id number]   [place]: [id number]

## 2019-07-17 DIAGNOSIS — R69 Illness, unspecified: Secondary | ICD-10-CM | POA: Diagnosis not present

## 2019-07-18 ENCOUNTER — Ambulatory Visit: Payer: Medicare HMO

## 2019-07-26 ENCOUNTER — Ambulatory Visit: Payer: Medicare HMO

## 2019-08-09 ENCOUNTER — Ambulatory Visit: Payer: Medicare HMO | Attending: Internal Medicine

## 2019-08-09 DIAGNOSIS — Z23 Encounter for immunization: Secondary | ICD-10-CM | POA: Insufficient documentation

## 2019-08-09 NOTE — Progress Notes (Signed)
   [id number] [facility]  Name:  Rodney Bailey.    MRN: [id number] DOB: 05/09/1951  08/09/2019  Mr. Rodney Bailey was observed post Covid-19 immunization for 15 minutes without incident. He was provided with Vaccine Information Sheet and instruction to access the V-Safe system.   Mr. Rodney Bailey was instructed to call 911 with any severe reactions post vaccine: Marland Kitchen Difficulty breathing  . Swelling of face and throat  . A fast heartbeat  . A bad rash all over body  . Dizziness and weakness   Immunizations Administered    Name Date Dose VIS Date Route   Pfizer COVID-19 Vaccine 08/09/2019  5:14 PM 0.3 mL 05/17/2019 Intramuscular   Manufacturer: [place]   Lot: [id number]   [place]: [id number]

## 2019-09-02 DIAGNOSIS — R69 Illness, unspecified: Secondary | ICD-10-CM | POA: Diagnosis not present

## 2019-09-09 ENCOUNTER — Other Ambulatory Visit: Payer: Self-pay | Admitting: Family Medicine

## 2019-09-09 MED ORDER — ATORVASTATIN CALCIUM 10 MG PO TABS: 10.0000 mg | ORAL_TABLET | Freq: Every day | ORAL | 0 refills | Status: DC

## 2019-09-09 NOTE — Telephone Encounter (Signed)
Copied from [place] [id number]. Topic: Quick Communication - Rx Refill/Question >> Sep 09, 2019  1:08 PM Mcneil, Ja-Kwan wrote: Medication: atorvastatin (LIPITOR) 10 MG tablet  Has the patient contacted their pharmacy? no  Preferred Pharmacy (with phone number or street name): [facility] [address] [place]  Phone: [phone]  Fax: [phone]  Agent: Please be advised that RX refills may take up to 3 business days. We ask that you follow-up with your pharmacy.

## 2019-09-25 DIAGNOSIS — M25512 Pain in left shoulder: Secondary | ICD-10-CM | POA: Diagnosis not present

## 2019-10-03 DIAGNOSIS — M19012 Primary osteoarthritis, left shoulder: Secondary | ICD-10-CM | POA: Insufficient documentation

## 2019-10-05 DIAGNOSIS — M25512 Pain in left shoulder: Secondary | ICD-10-CM | POA: Diagnosis not present

## 2019-10-09 DIAGNOSIS — M19012 Primary osteoarthritis, left shoulder: Secondary | ICD-10-CM | POA: Diagnosis not present

## 2019-10-28 ENCOUNTER — Telehealth: Payer: Self-pay | Admitting: Family Medicine

## 2019-10-28 DIAGNOSIS — G4700 Insomnia, unspecified: Secondary | ICD-10-CM

## 2019-10-28 MED ORDER — TRAZODONE HCL 100 MG PO TABS: ORAL_TABLET | ORAL | 3 refills | Status: DC

## 2019-10-28 NOTE — Telephone Encounter (Signed)
Pt would like to have refills for traZODone (DESYREL) 100 MG tablet  Sent to  [place], [place] Phone:  [phone]  Fax:  [phone]     Pt stated he changed to this pharmacy / please advise

## 2019-10-28 NOTE — Telephone Encounter (Signed)
Patient is asking for refill on trazodone, last refill  01/2019. , called pt to ask about sleep problem, no answer

## 2019-10-29 ENCOUNTER — Other Ambulatory Visit: Payer: Self-pay

## 2019-10-29 ENCOUNTER — Ambulatory Visit: Payer: Medicare HMO | Admitting: Dermatology

## 2019-10-29 ENCOUNTER — Encounter: Payer: Self-pay | Admitting: Dermatology

## 2019-10-29 DIAGNOSIS — D229 Melanocytic nevi, unspecified: Secondary | ICD-10-CM

## 2019-10-29 DIAGNOSIS — D225 Melanocytic nevi of trunk: Secondary | ICD-10-CM

## 2019-10-29 DIAGNOSIS — L821 Other seborrheic keratosis: Secondary | ICD-10-CM

## 2019-10-29 DIAGNOSIS — Z1283 Encounter for screening for malignant neoplasm of skin: Secondary | ICD-10-CM

## 2019-10-29 DIAGNOSIS — L230 Allergic contact dermatitis due to metals: Secondary | ICD-10-CM | POA: Diagnosis not present

## 2019-10-29 MED ORDER — HALOBETASOL PROPIONATE 0.05 % EX CREA: TOPICAL_CREAM | Freq: Two times a day (BID) | CUTANEOUS | 1 refills | Status: AC | PRN

## 2019-10-29 MED ORDER — BRYHALI 0.01 % EX LOTN: 1.0000 "application " | TOPICAL_LOTION | Freq: Once | CUTANEOUS | 0 refills | Status: AC

## 2019-10-29 NOTE — Progress Notes (Signed)
   Follow-Up Visit   Subjective  Rodney Bailey. is a 69 y.o. male who presents for the following: Skin Problem (Chek left thigh patient has a new bump there. x 1 month does itch alot. When patient first saw it it had like a black flake on it but that came off. ).  Rash Location: Left hip Duration: Months Quality:  Associated Signs/Symptoms: Modifying Factors: Hip prosthesis Severity:  Timing: Context:   The following portions of the chart were reviewed this encounter and updated as appropriate: Tobacco  Allergies  Problems  Med Hx  Surg Hx  Fam Hx      Objective  Well appearing patient in no apparent distress; mood and affect are within normal limits.  A full examination was performed including scalp, head, eyes, ears, nose, lips, neck, chest, axillae, abdomen, back, buttocks, bilateral upper extremities, bilateral lower extremities, hands, feet, fingers, toes, fingernails, and toenails. All findings within normal limits unless otherwise noted below.   Assessment & Plan  Allergic contact dermatitis due to metals Left Thigh - Anterior  3 to 4 weeks of local halobetasol.  Should this fail, consider referral for orthopedic patch test series.  Ordered Medications: halobetasol (ULTRAVATE) 0.05 % cream  Nevus (2) Left Upper Back; Right Upper Back  Self examined twice yearly, annual dermatological exam  Seborrheic keratosis Right Lower Back  Reassure Open cyst with large blackhead left scapula 5 x 8 mm textured brown seborrheic keratosis right scapula Skin cancer screening performed today.

## 2019-10-30 ENCOUNTER — Ambulatory Visit: Payer: Medicare HMO | Admitting: Family Medicine

## 2019-11-11 ENCOUNTER — Encounter: Payer: Self-pay | Admitting: Family Medicine

## 2019-11-11 ENCOUNTER — Other Ambulatory Visit: Payer: Self-pay

## 2019-11-11 ENCOUNTER — Ambulatory Visit (INDEPENDENT_AMBULATORY_CARE_PROVIDER_SITE_OTHER): Payer: Medicare HMO | Admitting: Family Medicine

## 2019-11-11 VITALS — BP 135/80 | HR 63 | Temp 98.4°F | Ht 66.0 in | Wt 166.0 lb

## 2019-11-11 DIAGNOSIS — E785 Hyperlipidemia, unspecified: Secondary | ICD-10-CM

## 2019-11-11 DIAGNOSIS — Z23 Encounter for immunization: Secondary | ICD-10-CM

## 2019-11-11 DIAGNOSIS — G4700 Insomnia, unspecified: Secondary | ICD-10-CM

## 2019-11-11 MED ORDER — TRAZODONE HCL 100 MG PO TABS: ORAL_TABLET | ORAL | 3 refills | Status: DC

## 2019-11-11 NOTE — Progress Notes (Signed)
Subjective:  Patient ID: Rodney Daub., male    DOB: 01-26-1951  Age: 69 y.o. MRN: [id number]  CC:  Chief Complaint  Patient presents with  . Follow-up    on hip replacement and hyperlipidemia. pt reports the surgery went well. pt also reports no issues with his hyperlipidemia nor has the pt had any physical symptoms. pt would like to inform us that in september or augest he is planing to have his L shoulder replaced.    HPI Rodney Esquivias Ammie Ferrier Jr. presents for   Doing well post hip surgery, plans on shoulder replacement in few months.   Pneumovax today.   Hyperlipidemia: lipitor - taking 5mg  qd.  No side effects on current dose.  Not fasting today.  The 10-year ASCVD risk score Rodney Bussing DC Brooke Bonito., et al., 2013) is: 16.5%   Values used to calculate the score:     Age: 69 years     Sex: Male     Is Non-Hispanic African American: No     Diabetic: No     Tobacco smoker: No     Systolic Blood Pressure: 093 mmHg     Is BP treated: No     HDL Cholesterol: 49 mg/dL     Total Cholesterol: 183 mg/dL  Lab Results  Component Value Date   CHOL 183 05/03/2019   HDL 49 05/03/2019   LDLCALC 124 (H) 05/03/2019   TRIG 51 05/03/2019   CHOLHDL 3.7 05/03/2019   Lab Results  Component Value Date   ALT 20 05/27/2019   AST 23 05/27/2019   ALKPHOS 60 05/27/2019   BILITOT 2.1 (H) 05/27/2019   Insomnia: Taking trazodone 100mg  qhs. Plans on trying to wean as working well.   Depression screen [facility] 2/9 11/11/2019 05/03/2019 01/18/2019 12/05/2017 03/28/2017  Decreased Interest 0 0 0 0 0  Down, Depressed, Hopeless 0 0 0 0 0  PHQ - 2 Score 0 0 0 0 0  Altered sleeping - - - - -  Tired, decreased energy - - - - -  Change in appetite - - - - -  Feeling bad or failure about yourself  - - - - -  Trouble concentrating - - - - -  Moving slowly or fidgety/restless - - - - -  Suicidal thoughts - - - - -  PHQ-9 Score - - - - -     Prior elevated creatinine - normal in 05/2019  Lab Results  Component  Value Date   CREATININE 1.20 05/27/2019      History Patient Active Problem List   Diagnosis Date Noted  . OA (osteoarthritis) of hip 05/29/2019  . Other specified arthritis, left hip 05/29/2019  . Need for influenza vaccination 03/28/2017  . Injury of toe on right foot 03/28/2017  . Subungual hematoma of great toe of right foot 03/28/2017  . Insomnia 11/02/2016  . Nocturia 11/03/2015  . Hematuria 04/26/2014   Past Medical History:  Diagnosis Date  . Allergy   . Arthritis    Past Surgical History:  Procedure Laterality Date  . TOTAL HIP ARTHROPLASTY Left 05/29/2019   Procedure: TOTAL HIP ARTHROPLASTY ANTERIOR APPROACH SAME DAY DISCHARGE;  Surgeon: Gaynelle Arabian, MD;  Location: [facility];  Service: Orthopedics;  Laterality: Left;  159min   No Known Allergies Prior to Admission medications   Medication Sig Start Date End Date Taking? Authorizing Provider  acetaminophen (TYLENOL) 500 MG tablet Take 500 mg by mouth every 6 (six) hours as needed for mild  pain or moderate pain.   Yes [provider]  Ascorbic Acid (VITAMIN C) 1000 MG tablet Take 1,000 mg by mouth daily.   Yes [provider]  atorvastatin (LIPITOR) 10 MG tablet Take 1 tablet (10 mg total) by mouth daily. Can initially start QOD to make sure tolerated. 09/09/19  Yes Wendie Agreste, MD  azelastine (ASTELIN) 137 MCG/SPRAY nasal spray Place 1 spray into the nose 2 (two) times daily. Use in each nostril as directed 09/11/12  Yes Dunn, Areta Haber, PA-C  Benfotiamine 150 MG CAPS Take 150 mg by mouth daily.   Yes [provider]  Cholecalciferol (VITAMIN D-3) 125 MCG (5000 UT) TABS Take 5,000 Units by mouth daily.   Yes [provider]  GLUCOSAMINE PO Take 1,500 mg by mouth daily.    Yes [provider]  halobetasol (ULTRAVATE) 0.05 % cream Apply topically 2 (two) times daily as needed for up to 14 days. 10/29/19 11/12/19 Yes Lavonna Monarch, MD  HYDROcodone-acetaminophen (NORCO/VICODIN)  5-325 MG tablet Take 1-2 tablets by mouth every 6 (six) hours as needed for severe pain. 05/29/19 05/28/20 Yes Edmisten, Kristie L, PA  KRILL OIL PO Take 220 mg by mouth daily.   Yes [provider]  Magnesium 400 MG TABS Take 400 mg by mouth 2 (two) times daily.   Yes [provider]  Melatonin 5 MG TABS Take 5-10 mg by mouth at bedtime as needed (sleep).    Yes [provider]  methocarbamol (ROBAXIN) 500 MG tablet Take 1 tablet (500 mg total) by mouth every 6 (six) hours as needed for muscle spasms. 05/29/19  Yes Edmisten, Kristie L, PA  OVER THE COUNTER MEDICATION Take 280 mg by mouth daily. Horsetail   Yes [provider]  SAW PALMETTO, SERENOA REPENS, PO Take 800 mg by mouth daily.    Yes [provider]  traZODone (DESYREL) 100 MG tablet TAKE HALF TO ONE TABLET BY MOUTH AT BEDTIME AS NEEDED FOR SLEEP 10/28/19  Yes Wendie Agreste, MD  TURMERIC PO Take 1,200 mg by mouth daily.    Yes [provider]  traMADol (ULTRAM) 50 MG tablet Take 1 tablet (50 mg total) by mouth every 6 (six) hours as needed for moderate pain. 05/29/19 05/28/20  Derl Barrow, PA   Social History   Socioeconomic History  . Marital status: Married    Spouse name: Not on file  . Number of children: Not on file  . Years of education: Not on file  . Highest education level: Not on file  Occupational History  . Not on file  Tobacco Use  . Smoking status: Never Smoker  . Smokeless tobacco: Never Used  Substance and Sexual Activity  . Alcohol use: No  . Drug use: No  . Sexual activity: Yes    Birth control/protection: None    Comment: SEX PARTNERS IN THE LAST 12 MONTHS 1 AND CURRENT BIRTH CONTROL - NOT NEEDED  Other Topics Concern  . Not on file  Social History Narrative   EXERCISE WALKING AND WEIGHT TRAINING 5-6 DAYS/WEEK FOR 30 MINUTES   His wife died 03/28/2017   Social Determinants of Health   Financial Resource Strain:   . Difficulty of Paying  Living Expenses:   Food Insecurity:   . Worried About Charity fundraiser in the Last Year:   . Arboriculturist in the Last Year:   Transportation Needs:   . Film/video editor (Medical):   Marland Kitchen  Lack of Transportation (Non-Medical):   Physical Activity:   . Days of Exercise per Week:   . Minutes of Exercise per Session:   Stress:   . Feeling of Stress :   Social Connections:   . Frequency of Communication with Friends and Family:   . Frequency of Social Gatherings with Friends and Family:   . Attends Religious Services:   . Active Member of Clubs or Organizations:   . Attends Archivist Meetings:   Marland Kitchen Marital Status:   Intimate Partner Violence:   . Fear of Current or Ex-Partner:   . Emotionally Abused:   Marland Kitchen Physically Abused:   . Sexually Abused:     Review of Systems  Psychiatric/Behavioral: Negative for dysphoric mood and sleep disturbance (doing well with trazodone. ). The patient is not nervous/anxious.      Objective:   Vitals:   11/11/19 1123  BP: 135/80  Pulse: 63  Temp: 98.4 F (36.9 C)  TempSrc: Temporal  SpO2: 97%  Weight: 166 lb (75.3 kg)  Height: 5\' 6"  (1.676 m)     Physical Exam Vitals reviewed.  Constitutional:      Appearance: He is well-developed.  HENT:     Head: Normocephalic and atraumatic.  Eyes:     Pupils: Pupils are equal, round, and reactive to light.  Neck:     Vascular: No carotid bruit or JVD.  Cardiovascular:     Rate and Rhythm: Normal rate and regular rhythm.     Heart sounds: Normal heart sounds. No murmur.  Pulmonary:     Effort: Pulmonary effort is normal.     Breath sounds: Normal breath sounds. No rales.  Skin:    General: Skin is warm and dry.  Neurological:     Mental Status: He is alert and oriented to person, place, and time.  Psychiatric:        Mood and Affect: Mood normal.        Behavior: Behavior normal.        Assessment & Plan:  Rodney Torrence. is a 69 y.o. male . Insomnia,  unspecified type - Plan: traZODone (DESYREL) 100 MG tablet  -Well-controlled with trazodone 100 mg nightly.  Continue same.  Plans to wean as tolerated.  Need for prophylactic vaccination against Streptococcus pneumoniae (pneumococcus) - Plan: Pneumococcal polysaccharide vaccine 23-valent greater than or equal to 2yo subcutaneous/IM  Hyperlipidemia, unspecified hyperlipidemia type  -Tolerating current regimen.  Continue same.  Meds ordered this encounter  Medications  . traZODone (DESYREL) 100 MG tablet    Sig: TAKE HALF TO ONE TABLET BY MOUTH AT BEDTIME AS NEEDED FOR SLEEP    Dispense:  90 tablet    Refill:  3   Patient Instructions       If you have lab work done today you will be contacted with your lab results within the next 2 weeks.  If you have not heard from [place] then please contact us. The fastest way to get your results is to register for My Chart.   IF you received an x-ray today, you will receive an invoice from [facility]. Please contact [facility] at [phone] with questions or concerns regarding your invoice.   IF you received labwork today, you will receive an invoice from [place]. Please contact [facility] at [phone] with questions or concerns regarding your invoice.   Our billing staff will not be able to assist you with questions regarding bills from these companies.  You will be contacted  with the lab results as soon as they are available. The fastest way to get your results is to activate your My Chart account. Instructions are located on the last page of this paperwork. If you have not heard from [place] regarding the results in 2 weeks, please contact this office.    No med changes today. Labs at preop visit in next few months. Thanks for coming in today.      Signed, Merri Ray, MD [facility]

## 2019-11-11 NOTE — Patient Instructions (Addendum)
     If you have lab work done today you will be contacted with your lab results within the next 2 weeks.  If you have not heard from [place] then please contact us. The fastest way to get your results is to register for My Chart.   IF you received an x-ray today, you will receive an invoice from [facility]. Please contact [facility] at [phone] with questions or concerns regarding your invoice.   IF you received labwork today, you will receive an invoice from [place]. Please contact [facility] at [phone] with questions or concerns regarding your invoice.   Our billing staff will not be able to assist you with questions regarding bills from these companies.  You will be contacted with the lab results as soon as they are available. The fastest way to get your results is to activate your My Chart account. Instructions are located on the last page of this paperwork. If you have not heard from [place] regarding the results in 2 weeks, please contact this office.    No med changes today. Labs at preop visit in next few months. Thanks for coming in today.

## 2019-11-12 ENCOUNTER — Encounter: Payer: Self-pay | Admitting: Family Medicine

## 2019-12-05 DIAGNOSIS — E663 Overweight: Secondary | ICD-10-CM | POA: Diagnosis not present

## 2019-12-05 DIAGNOSIS — R030 Elevated blood-pressure reading, without diagnosis of hypertension: Secondary | ICD-10-CM | POA: Diagnosis not present

## 2019-12-05 DIAGNOSIS — E785 Hyperlipidemia, unspecified: Secondary | ICD-10-CM | POA: Diagnosis not present

## 2019-12-05 DIAGNOSIS — Z6826 Body mass index (BMI) 26.0-26.9, adult: Secondary | ICD-10-CM | POA: Diagnosis not present

## 2019-12-05 DIAGNOSIS — Z833 Family history of diabetes mellitus: Secondary | ICD-10-CM | POA: Diagnosis not present

## 2019-12-05 DIAGNOSIS — L309 Dermatitis, unspecified: Secondary | ICD-10-CM | POA: Diagnosis not present

## 2019-12-05 DIAGNOSIS — Z823 Family history of stroke: Secondary | ICD-10-CM | POA: Diagnosis not present

## 2019-12-05 DIAGNOSIS — Z803 Family history of malignant neoplasm of breast: Secondary | ICD-10-CM | POA: Diagnosis not present

## 2019-12-05 DIAGNOSIS — G4700 Insomnia, unspecified: Secondary | ICD-10-CM | POA: Diagnosis not present

## 2019-12-05 DIAGNOSIS — M1990 Unspecified osteoarthritis, unspecified site: Secondary | ICD-10-CM | POA: Diagnosis not present

## 2019-12-11 ENCOUNTER — Other Ambulatory Visit: Payer: Self-pay

## 2019-12-11 ENCOUNTER — Ambulatory Visit: Payer: Medicare HMO | Admitting: Dermatology

## 2019-12-11 DIAGNOSIS — L309 Dermatitis, unspecified: Secondary | ICD-10-CM

## 2019-12-11 NOTE — Patient Instructions (Addendum)
Routine follow-up for Rodney Bailey. date of birth 1950/10/16.  Both he and I feel that there has been some improvement in the inflammatory spot near the left hip prosthesis.  He feels the improvement may be related the use of the cream and I'm uncertain whether isn't simply spontaneous.  By examination there is 3 mm inflammatory dermal papule; with pressure there is a tiny little central white dot.  Clinically this better fits a small inflamed cyst or even bite granuloma then a contact dermatitis related to the underlying prosthesis.  He is facing the possibility of having a left shoulder prosthesis with Dr. Onnie Graham in the future.  We discussed referral to do complete testing for the metal allergies in the prosthesis, but there is quite a bit of controversy even in people who test positive whether this would actually precipitate reaction and I don't feel that this is essential in order for him to proceed with the necessary shoulder prosthesis.  The 3 options for the small spot on the left hip would be to stay the course (he certainly may continue the cream even if I'm uncertain whether that's the reason for the improvement; to do a simple punch biopsy which might cure assist and might prove what this is not, but this would not confirm whether or not it was related to the prosthesis; or #3 to inject the spot with a dilute cortisone which could help either an inflamed cyst or an insect bite granuloma, but which certainly would have no effect on a question of metal allergy.  [name] 9 8 for now agreed to stay the course.  If there is any question in his mind or Dr. Augustin Coupe mind about metal allergy before his shoulder prosthesis, I would be happy to refer him to one of the medical centers that does testing for that.  Otherwise follow-up can be on a as needed basis

## 2019-12-30 ENCOUNTER — Encounter: Payer: Self-pay | Admitting: Dermatology

## 2019-12-30 NOTE — Progress Notes (Signed)
   Follow-Up Visit   Subjective  Rodney Bailey. is a 69 y.o. male who presents for the following: Skin Problem (left thigh).  Rash left hip Location:  Duration:  Quality:  Associated Signs/Symptoms: Modifying Factors:  Severity:  Timing: Context: Onset after hip prosthesis  Objective  Well appearing patient in no apparent distress; mood and affect are within normal limits.  A focused examination was performed including Arms and legs.. Relevant physical exam findings are noted in the Assessment and Plan.   Assessment & Plan  Routine follow-up for Rodney Bailey. date of birth [birth year].  Both he and I feel that there has been some improvement in the inflammatory spot near the left hip prosthesis.  He feels the improvement may be related the use of the cream and I'm uncertain whether isn't simply spontaneous.  By examination there is 3 mm inflammatory dermal papule; with pressure there is a tiny little central white dot.  Clinically this better fits a small inflamed cyst or even bite granuloma then a contact dermatitis related to the underlying prosthesis.  He is facing the possibility of having a left shoulder prosthesis with Rodney Bailey in the future.  We discussed referral to do complete testing for the metal allergies in the prosthesis, but there is quite a bit of controversy even in people who test positive whether this would actually precipitate reaction; I don't feel that this is essential in order for him to proceed with the necessary shoulder prosthesis.  The 3 options for the small spot on the left hip would be to stay the course (he certainly may continue the cream even if I'm uncertain whether that's the reason for the improvement); to do a simple punch biopsy which might cure the spot and might prove what this is not, but this would not confirm whether or not it was related to the prosthesis; or #3 to inject the spot with a dilute cortisone which could help either an  inflamed cyst or an insect bite granuloma, but which certainly would have no effect on a question of metal allergy.  Rodney Bailey and I for now agreed to stay the course.  If there is any question in his mind or Rodney Bailey mind about metal allergy before his shoulder prosthesis, I would be happy to refer him to one of the medical centers that does testing for that.  Otherwise follow-up can be on a as needed basis      I, Rodney Monarch, MD, have reviewed all documentation for this visit.  The documentation on 12/30/19 for the exam, diagnosis, procedures, and orders are all accurate and complete.

## 2020-01-13 ENCOUNTER — Ambulatory Visit (INDEPENDENT_AMBULATORY_CARE_PROVIDER_SITE_OTHER): Payer: Medicare HMO

## 2020-01-13 ENCOUNTER — Other Ambulatory Visit: Payer: Self-pay

## 2020-01-13 ENCOUNTER — Encounter: Payer: Self-pay | Admitting: Family Medicine

## 2020-01-13 ENCOUNTER — Ambulatory Visit (INDEPENDENT_AMBULATORY_CARE_PROVIDER_SITE_OTHER): Payer: Medicare HMO | Admitting: Family Medicine

## 2020-01-13 VITALS — BP 137/81 | HR 63 | Temp 97.9°F | Ht 66.0 in | Wt 171.0 lb

## 2020-01-13 DIAGNOSIS — Z01818 Encounter for other preprocedural examination: Secondary | ICD-10-CM

## 2020-01-13 DIAGNOSIS — E785 Hyperlipidemia, unspecified: Secondary | ICD-10-CM | POA: Diagnosis not present

## 2020-01-13 DIAGNOSIS — G4700 Insomnia, unspecified: Secondary | ICD-10-CM

## 2020-01-13 MED ORDER — ATORVASTATIN CALCIUM 10 MG PO TABS: 10.0000 mg | ORAL_TABLET | Freq: Every day | ORAL | 2 refills | Status: DC

## 2020-01-13 NOTE — Patient Instructions (Addendum)
Based on  discussion today I do not anticipate any potential restrictions to your upcoming surgery.  I will complete paperwork once I have your labs and results.  Good luck.  No change in medications today.  If there are other concerns please let me know.  Take care.    If you have lab work done today you will be contacted with your lab results within the next 2 weeks.  If you have not heard from [place] then please contact us. The fastest way to get your results is to register for My Chart.   IF you received an x-ray today, you will receive an invoice from [facility]. Please contact [facility] at [phone] with questions or concerns regarding your invoice.   IF you received labwork today, you will receive an invoice from [place]. Please contact [facility] at [phone] with questions or concerns regarding your invoice.   Our billing staff will not be able to assist you with questions regarding bills from these companies.  You will be contacted with the lab results as soon as they are available. The fastest way to get your results is to activate your My Chart account. Instructions are located on the last page of this paperwork. If you have not heard from [place] regarding the results in 2 weeks, please contact this office.

## 2020-01-13 NOTE — Progress Notes (Signed)
Subjective:  Patient ID: Rodney Bailey., male    DOB: 19-Jan-1951  Age: 69 y.o. MRN: [id number]  CC:  Chief Complaint  Patient presents with  . Pre-op Exam    Pt is having his Lshoulder replaced on  02/06/2020. Pt reports the shoulder is tender and pt has decreased range of motion.     HPI Rodney Bailey Rodney Ferrier Jr. presents for   Preoperative exam for left shoulder replacement, date of surgery February 06, 2020. Dr. Onnie Graham.  History of hyperlipidemia treated with Lipitor 10 mg daily, trazodone for insomnia.  No known history of sleep apnea known. Some snoring. Nose patch helps. Feels rested most of the time.  No CP with exertion.  Riding stationary bike 25 min per day. No CP/exertion with 1-2 flights of stairs.  No hx of CVA, CKD, MI/CHF/arhythmia.  RCRI/Goldman index score 0.  Nonsmoker.   Hyperlipidemia: No new myalgias with lipitor.  Lab Results  Component Value Date   CHOL 183 05/03/2019   HDL 49 05/03/2019   LDLCALC 124 (H) 05/03/2019   TRIG 51 05/03/2019   CHOLHDL 3.7 05/03/2019   Lab Results  Component Value Date   ALT 20 05/27/2019   AST 23 05/27/2019   ALKPHOS 60 05/27/2019   BILITOT 2.1 (H) 05/27/2019    Insomnia: Trazodone working well. 100mg  QHS. Rested.   History Patient Active Problem List   Diagnosis Date Noted  . OA (osteoarthritis) of hip 05/29/2019  . Other specified arthritis, left hip 05/29/2019  . Need for influenza vaccination 03/28/2017  . Injury of toe on right foot 03/28/2017  . Subungual hematoma of great toe of right foot 03/28/2017  . Insomnia 11/02/2016  . Nocturia 11/03/2015  . Hematuria 04/26/2014   Past Medical History:  Diagnosis Date  . Allergy   . Arthritis    Past Surgical History:  Procedure Laterality Date  . TOTAL HIP ARTHROPLASTY Left 05/29/2019   Procedure: TOTAL HIP ARTHROPLASTY ANTERIOR APPROACH SAME DAY DISCHARGE;  Surgeon: Gaynelle Arabian, MD;  Location: [facility];  Service: Orthopedics;  Laterality: Left;  124min    No Known Allergies Prior to Admission medications   Medication Sig Start Date End Date Taking? Authorizing Provider  acetaminophen (TYLENOL) 500 MG tablet Take 500 mg by mouth every 6 (six) hours as needed for mild pain or moderate pain.   Yes [provider]  Ascorbic Acid (VITAMIN C) 1000 MG tablet Take 1,000 mg by mouth daily.   Yes [provider]  atorvastatin (LIPITOR) 10 MG tablet Take 1 tablet (10 mg total) by mouth daily. Can initially start QOD to make sure tolerated. 09/09/19  Yes Wendie Agreste, MD  azelastine (ASTELIN) 137 MCG/SPRAY nasal spray Place 1 spray into the nose 2 (two) times daily. Use in each nostril as directed 09/11/12  Yes Dunn, Areta Haber, PA-C  Benfotiamine 150 MG CAPS Take 150 mg by mouth daily.   Yes [provider]  Cholecalciferol (VITAMIN D-3) 125 MCG (5000 UT) TABS Take 5,000 Units by mouth daily.   Yes [provider]  GLUCOSAMINE PO Take 1,500 mg by mouth daily.    Yes [provider]  HYDROcodone-acetaminophen (NORCO/VICODIN) 5-325 MG tablet Take 1-2 tablets by mouth every 6 (six) hours as needed for severe pain. 05/29/19 05/28/20 Yes Edmisten, Kristie L, PA  KRILL OIL PO Take 220 mg by mouth daily.   Yes [provider]  Magnesium 400 MG TABS Take 400 mg by mouth 2 (two) times daily.  Yes [provider]  Melatonin 5 MG TABS Take 5-10 mg by mouth at bedtime as needed (sleep).    Yes [provider]  methocarbamol (ROBAXIN) 500 MG tablet Take 1 tablet (500 mg total) by mouth every 6 (six) hours as needed for muscle spasms. 05/29/19  Yes Edmisten, Kristie L, PA  OVER THE COUNTER MEDICATION Take 280 mg by mouth daily. Horsetail   Yes [provider]  SAW PALMETTO, SERENOA REPENS, PO Take 800 mg by mouth daily.    Yes [provider]  traZODone (DESYREL) 100 MG tablet TAKE HALF TO ONE TABLET BY MOUTH AT BEDTIME AS NEEDED FOR SLEEP 11/11/19  Yes Wendie Agreste, MD  TURMERIC  PO Take 1,200 mg by mouth daily.    Yes [provider]   Social History   Socioeconomic History  . Marital status: Married    Spouse name: Not on file  . Number of children: Not on file  . Years of education: Not on file  . Highest education level: Not on file  Occupational History  . Not on file  Tobacco Use  . Smoking status: Never Smoker  . Smokeless tobacco: Never Used  Vaping Use  . Vaping Use: Never used  Substance and Sexual Activity  . Alcohol use: No  . Drug use: No  . Sexual activity: Yes    Birth control/protection: None    Comment: SEX PARTNERS IN THE LAST 12 MONTHS 1 AND CURRENT BIRTH CONTROL - NOT NEEDED  Other Topics Concern  . Not on file  Social History Narrative   EXERCISE WALKING AND WEIGHT TRAINING 5-6 DAYS/WEEK FOR 30 MINUTES   His wife died 2017-03-14   Social Determinants of Health   Financial Resource Strain:   . Difficulty of Paying Living Expenses:   Food Insecurity:   . Worried About Charity fundraiser in the Last Year:   . Arboriculturist in the Last Year:   Transportation Needs:   . Film/video editor (Medical):   Marland Kitchen Lack of Transportation (Non-Medical):   Physical Activity:   . Days of Exercise per Week:   . Minutes of Exercise per Session:   Stress:   . Feeling of Stress :   Social Connections:   . Frequency of Communication with Friends and Family:   . Frequency of Social Gatherings with Friends and Family:   . Attends Religious Services:   . Active Member of Clubs or Organizations:   . Attends Archivist Meetings:   Marland Kitchen Marital Status:   Intimate Partner Violence:   . Fear of Current or Ex-Partner:   . Emotionally Abused:   Marland Kitchen Physically Abused:   . Sexually Abused:     Review of Systems  Constitutional: Negative for fatigue and unexpected weight change.  Eyes: Negative for visual disturbance.  Respiratory: Negative for cough, chest tightness and shortness of breath.   Cardiovascular: Negative for chest  pain, palpitations and leg swelling.  Gastrointestinal: Negative for abdominal pain and blood in stool.  Neurological: Negative for dizziness, light-headedness and headaches.     Objective:   Vitals:   01/13/20 0936  BP: 137/81  Pulse: 63  Temp: 97.9 F (36.6 C)  TempSrc: Temporal  SpO2: 98%  Weight: 171 lb (77.6 kg)  Height: 5\' 6"  (1.676 m)     Physical Exam Vitals reviewed.  Constitutional:      Appearance: He is well-developed.  HENT:     Head: Normocephalic and atraumatic.  Eyes:     Pupils: Pupils are equal, round, and reactive to light.  Neck:     Vascular: No carotid bruit or JVD.  Cardiovascular:     Rate and Rhythm: Normal rate and regular rhythm.     Heart sounds: Normal heart sounds. No murmur heard.   Pulmonary:     Effort: Pulmonary effort is normal.     Breath sounds: Normal breath sounds. No rales.  Abdominal:     General: There is no distension.     Tenderness: There is no abdominal tenderness.  Skin:    General: Skin is warm and dry.  Neurological:     Mental Status: He is alert and oriented to person, place, and time.  Psychiatric:        Mood and Affect: Mood normal.        Behavior: Behavior normal.   EKG: Sinus rhythm, rate 54, sinus bradycardia.  Previous rate 62 in November 2020.  No acute ST-T wave changes or apparent significant changes from prior EKG in November 2020  DG Chest 2 View  Result Date: 01/13/2020 CLINICAL DATA:  Preop shoulder surgery. EXAM: CHEST - 2 VIEW COMPARISON:  05/06/2019 FINDINGS: The heart size and mediastinal contours are within normal limits. Both lungs are clear. The visualized skeletal structures are unremarkable. IMPRESSION: No active cardiopulmonary disease. Electronically Signed   By: Marin Olp M.D.   On: 01/13/2020 10:22     Assessment & Plan:  Kailo Kosik. is a 69 y.o. male . Preop examination - Plan: Comprehensive metabolic panel, CBC, DG Chest 2 View, EKG 12-Lead  -Check labs, EKG, chest  x-ray, anticipate he will be low risk of major adverse cardiac event.  RCRI index as above 0.  will complete paperwork once labs/studies reviewed.  Hyperlipidemia, unspecified hyperlipidemia type - Plan: Lipid panel, Comprehensive metabolic panel, atorvastatin (LIPITOR) 10 MG tablet  -Tolerating current regimen, continue same, labs pending  Insomnia, unspecified type  -Stable with trazodone.  Well rested.  Option of sleep study with snoring but less likely OSA.  Meds ordered this encounter  Medications  . atorvastatin (LIPITOR) 10 MG tablet    Sig: Take 1 tablet (10 mg total) by mouth daily. Can initially start QOD to make sure tolerated.    Dispense:  90 tablet    Refill:  2   Patient Instructions   Based on  discussion today I do not anticipate any potential restrictions to your upcoming surgery.  I will complete paperwork once I have your labs and results.  Good luck.  No change in medications today.  If there are other concerns please let me know.  Take care.    If you have lab work done today you will be contacted with your lab results within the next 2 weeks.  If you have not heard from [place] then please contact us. The fastest way to get your results is to register for My Chart.   IF you received an x-ray today, you will receive an invoice from [facility]. Please contact [facility] at [phone] with questions or concerns regarding your invoice.   IF you received labwork today, you will receive an invoice from [facility]. Please contact [facility] at [phone] with questions or concerns regarding your invoice.   Our billing staff will not be able to assist you with questions regarding bills from these companies.  You will be contacted with the lab results as soon as they are available. The fastest way to get  your results is to activate your My Chart account. Instructions are located on the last page of this paperwork. If you have not heard from [place] regarding the  results in 2 weeks, please contact this office.         Signed, Merri Ray, MD [facility]

## 2020-01-14 LAB — COMPREHENSIVE METABOLIC PANEL
ALT: 32 IU/L (ref 0–44)
AST: 30 IU/L (ref 0–40)
Albumin/Globulin Ratio: 2.2 (ref 1.2–2.2)
Albumin: 4.6 g/dL (ref 3.8–4.8)
Alkaline Phosphatase: 74 IU/L (ref 48–121)
BUN/Creatinine Ratio: 20 (ref 10–24)
BUN: 27 mg/dL (ref 8–27)
Bilirubin Total: 1.6 mg/dL — ABNORMAL HIGH (ref 0.0–1.2)
CO2: 24 mmol/L (ref 20–29)
Calcium: 10 mg/dL (ref 8.6–10.2)
Chloride: 103 mmol/L (ref 96–106)
Creatinine, Ser: 1.38 mg/dL — ABNORMAL HIGH (ref 0.76–1.27)
GFR calc Af Amer: 60 mL/min/{1.73_m2} (ref >59)
GFR calc non Af Amer: 52 mL/min/{1.73_m2} — ABNORMAL LOW (ref >59)
Globulin, Total: 2.1 g/dL (ref 1.5–4.5)
Glucose: 97 mg/dL (ref 65–99)
Potassium: 4.8 mmol/L (ref 3.5–5.2)
Sodium: 141 mmol/L (ref 134–144)
Total Protein: 6.7 g/dL (ref 6.0–8.5)

## 2020-01-14 LAB — CBC
Hematocrit: 47.6 % (ref 37.5–51.0)
Hemoglobin: 15.8 g/dL (ref 13.0–17.7)
MCH: 31.5 pg (ref 26.6–33.0)
MCHC: 33.2 g/dL (ref 31.5–35.7)
MCV: 95 fL (ref 79–97)
Platelets: 231 10*3/uL (ref 150–450)
RBC: 5.01 x10E6/uL (ref 4.14–5.80)
RDW: 12.5 % (ref 11.6–15.4)
WBC: 4.9 10*3/uL (ref 3.4–10.8)

## 2020-01-14 LAB — LIPID PANEL
Chol/HDL Ratio: 3.6 ratio (ref 0.0–5.0)
Cholesterol, Total: 185 mg/dL (ref 100–199)
HDL: 51 mg/dL (ref >39)
LDL Chol Calc (NIH): 121 mg/dL — ABNORMAL HIGH (ref 0–99)
Triglycerides: 72 mg/dL (ref 0–149)
VLDL Cholesterol Cal: 13 mg/dL (ref 5–40)

## 2020-01-27 NOTE — Patient Instructions (Addendum)
DUE TO COVID-19 ONLY ONE VISITOR IS ALLOWED TO COME WITH YOU AND STAY IN THE WAITING ROOM ONLY DURING PRE OP AND PROCEDURE DAY OF SURGERY. THE 1 VISITOR  MAY VISIT WITH YOU AFTER SURGERY IN YOUR PRIVATE ROOM DURING VISITING HOURS ONLY!  YOU NEED TO HAVE A COVID 19 TEST ON: 02/03/20 @ 11:00 am, THIS TEST MUST BE DONE BEFORE SURGERY,  COVID TESTING SITE [place] [id number], IT IS ON THE RIGHT GOING OUT [address] APPROXIMATELY  2 MINUTES PAST [employer] ON THE RIGHT. ONCE YOUR COVID TEST IS COMPLETED,  PLEASE BEGIN THE QUARANTINE INSTRUCTIONS AS OUTLINED IN YOUR HANDOUT.                Rodney Bailey.   Your procedure is scheduled on: 02/06/20   Report to [facility] Main  Entrance   Report to short stay at: 5:30 AM     Call this number if you have problems the morning of surgery [phone]    Remember:   NO SOLID FOOD AFTER MIDNIGHT THE NIGHT PRIOR TO SURGERY. NOTHING BY MOUTH EXCEPT CLEAR LIQUIDS UNTIL: 4:30 am . PLEASE FINISH ENSURE DRINK PER SURGEON ORDER  WHICH NEEDS TO BE COMPLETED AT: 4:30 am .  CLEAR LIQUID DIET   Foods Allowed                                                                     Foods Excluded  Coffee and tea, regular and decaf                             liquids that you cannot  Plain Jell-O any favor except red or purple                                           see through such as: Fruit ices (not with fruit pulp)                                     milk, soups, orange juice  Iced Popsicles                                    All solid food Carbonated beverages, regular and diet                                    Cranberry, grape and apple juices Sports drinks like Gatorade Lightly seasoned clear broth or consume(fat free) Sugar, honey syrup  Sample Menu Breakfast                                Lunch  Supper Cranberry juice                    Beef broth                             Chicken broth Jell-O                                     Grape juice                           Apple juice Coffee or tea                        Jell-O                                      Popsicle                                                Coffee or tea                        Coffee or tea  _____________________________________________________________________   BRUSH YOUR TEETH MORNING OF SURGERY AND RINSE YOUR MOUTH OUT, NO CHEWING GUM CANDY OR MINTS.                                  You may not have any metal on your body including hair pins and              piercings  Do not wear jewelry, lotions, powders or perfumes, deodorant             Men may shave face and neck.   Do not bring valuables to the hospital. [place].  Contacts, dentures or bridgework may not be worn into surgery.  Leave suitcase in the car. After surgery it may be brought to your room.     Patients discharged the day of surgery will not be allowed to drive home. IF YOU ARE HAVING SURGERY AND GOING HOME THE SAME DAY, YOU MUST HAVE AN ADULT TO DRIVE YOU HOME AND BE WITH YOU FOR 24 HOURS. YOU MAY GO HOME BY TAXI OR UBER OR ORTHERWISE, BUT AN ADULT MUST ACCOMPANY YOU HOME AND STAY WITH YOU FOR 24 HOURS.  Name and phone number of your driver:  Special Instructions: [name]              Please read over the following fact sheets you were given: _____________________________________________________________________  [facility]- Preparing for Total Shoulder Arthroplasty    Before surgery, you can play an important role. Because skin is not sterile, your skin needs to be as free of germs as possible. You can reduce the number of germs on your skin by using the following products. . Benzoyl Peroxide Gel o Reduces the number of germs present on the skin o Applied twice a day  to shoulder area starting two days before surgery     ==================================================================  Please follow these instructions carefully:  BENZOYL PEROXIDE 5% GEL  Please do not use if you have an allergy to benzoyl peroxide.   If your skin becomes reddened/irritated stop using the benzoyl peroxide.  Starting two days before surgery, apply as follows: 1. Apply benzoyl peroxide in the morning and at night. Apply after taking a shower. If you are not taking a shower clean entire shoulder front, back, and side along with the armpit with a clean wet washcloth.  2. Place a quarter-sized dollop on your shoulder and rub in thoroughly, making sure to cover the front, back, and side of your shoulder, along with the armpit.   2 days before ____ AM   ____ PM              1 day before ____ AM   ____ PM                         3. Do this twice a day for two days.  (Last application is the night before surgery, AFTER using the CHG soap as described below).  4. Do NOT apply benzoyl peroxide gel on the day of surgery.         [facility] - Preparing for Surgery Before surgery, you can play an important role.  Because skin is not sterile, your skin needs to be as free of germs as possible.  You can reduce the number of germs on your skin by washing with CHG (chlorahexidine gluconate) soap before surgery.  CHG is an antiseptic cleaner which kills germs and bonds with the skin to continue killing germs even after washing. Please DO NOT use if you have an allergy to CHG or antibacterial soaps.  If your skin becomes reddened/irritated stop using the CHG and inform your nurse when you arrive at Short Stay. Do not shave (including legs and underarms) for at least 48 hours prior to the first CHG shower.  You may shave your face/neck. Please follow these instructions carefully:  1.  Shower with CHG Soap the night before surgery and the  morning of Surgery.  2.  If you choose to wash your hair, wash your hair first as usual with your   normal  shampoo.  3.  After you shampoo, rinse your hair and body thoroughly to remove the  shampoo.                           4.  Use CHG as you would any other liquid soap.  You can apply chg directly  to the skin and wash                       Gently with a scrungie or clean washcloth.  5.  Apply the CHG Soap to your body ONLY FROM THE NECK DOWN.   Do not use on face/ open                           Wound or open sores. Avoid contact with eyes, ears mouth and genitals (private parts).                       Wash face,  Genitals (private parts) with your normal soap.  6.  Wash thoroughly, paying special attention to the area where your surgery  will be performed.  7.  Thoroughly rinse your body with warm water from the neck down.  8.  DO NOT shower/wash with your normal soap after using and rinsing off  the CHG Soap.                9.  Pat yourself dry with a clean towel.            10.  Wear clean pajamas.            11.  Place clean sheets on your bed the night of your first shower and do not  sleep with pets. Day of Surgery : Do not apply any lotions/deodorants the morning of surgery.  Please wear clean clothes to the hospital/surgery center.  FAILURE TO FOLLOW THESE INSTRUCTIONS MAY RESULT IN THE CANCELLATION OF YOUR SURGERY PATIENT SIGNATURE_________________________________  NURSE SIGNATURE__________________________________  ________________________________________________________________________   Adam Phenix  An incentive spirometer is a tool that can help keep your lungs clear and active. This tool measures how well you are filling your lungs with each breath. Taking long deep breaths may help reverse or decrease the chance of developing breathing (pulmonary) problems (especially infection) following:  A long period of time when you are unable to move or be active. BEFORE THE PROCEDURE   If the spirometer includes an indicator to show your best effort, your  nurse or respiratory therapist will set it to a desired goal.  If possible, sit up straight or lean slightly forward. Try not to slouch.  Hold the incentive spirometer in an upright position. INSTRUCTIONS FOR USE  1. Sit on the edge of your bed if possible, or sit up as far as you can in bed or on a chair. 2. Hold the incentive spirometer in an upright position. 3. Breathe out normally. 4. Place the mouthpiece in your mouth and seal your lips tightly around it. 5. Breathe in slowly and as deeply as possible, raising the piston or the ball toward the top of the column. 6. Hold your breath for 3-5 seconds or for as long as possible. Allow the piston or ball to fall to the bottom of the column. 7. Remove the mouthpiece from your mouth and breathe out normally. 8. Rest for a few seconds and repeat Steps 1 through 7 at least 10 times every 1-2 hours when you are awake. Take your time and take a few normal breaths between deep breaths. 9. The spirometer may include an indicator to show your best effort. Use the indicator as a goal to work toward during each repetition. 10. After each set of 10 deep breaths, practice coughing to be sure your lungs are clear. If you have an incision (the cut made at the time of surgery), support your incision when coughing by placing a pillow or rolled up towels firmly against it. Once you are able to get out of bed, walk around indoors and cough well. You may stop using the incentive spirometer when instructed by your caregiver.  RISKS AND COMPLICATIONS  Take your time so you do not get dizzy or light-headed.  If you are in pain, you may need to take or ask for pain medication before doing incentive spirometry. It is harder to take a deep breath if you are having pain. AFTER USE  Rest and breathe slowly and easily.  It can be helpful to keep track of a log of your  progress. Your caregiver can provide you with a simple table to help with this. If you are using the  spirometer at home, follow these instructions: [place] IF:   You are having difficultly using the spirometer.  You have trouble using the spirometer as often as instructed.  Your pain medication is not giving enough relief while using the spirometer.  You develop fever of 100.5 F (38.1 C) or higher. SEEK IMMEDIATE MEDICAL CARE IF:   You cough up bloody sputum that had not been present before.  You develop fever of 102 F (38.9 C) or greater.  You develop worsening pain at or near the incision site. MAKE SURE YOU:   Understand these instructions.  Will watch your condition.  Will get help right away if you are not doing well or get worse. Document Released: 10/03/2006 Document Revised: 08/15/2011 Document Reviewed: 12/04/2006 [facility] Patient Information 2014 [place], [place].   ________________________________________________________________________

## 2020-01-28 ENCOUNTER — Encounter (HOSPITAL_COMMUNITY): Payer: Self-pay

## 2020-01-28 ENCOUNTER — Other Ambulatory Visit: Payer: Self-pay

## 2020-01-28 ENCOUNTER — Encounter (HOSPITAL_COMMUNITY)
Admission: RE | Admit: 2020-01-28 | Discharge: 2020-01-28 | Disposition: A | Discharge status: Home / Self Care | Payer: Medicare HMO | Source: Ambulatory Visit | Attending: Orthopedic Surgery | Admitting: Orthopedic Surgery

## 2020-01-28 DIAGNOSIS — Z01812 Encounter for preprocedural laboratory examination: Secondary | ICD-10-CM | POA: Insufficient documentation

## 2020-01-28 LAB — CBC
HCT: 48.1 % (ref 39.0–52.0)
Hemoglobin: 15.8 g/dL (ref 13.0–17.0)
MCH: 31.3 pg (ref 26.0–34.0)
MCHC: 32.8 g/dL (ref 30.0–36.0)
MCV: 95.4 fL (ref 80.0–100.0)
Platelets: 230 10*3/uL (ref 150–400)
RBC: 5.04 MIL/uL (ref 4.22–5.81)
RDW: 12.6 % (ref 11.5–15.5)
WBC: 5.9 10*3/uL (ref 4.0–10.5)
nRBC: 0 % (ref 0.0–0.2)

## 2020-01-28 LAB — SURGICAL PCR SCREEN
MRSA, PCR: NEGATIVE
Staphylococcus aureus: NEGATIVE

## 2020-01-28 NOTE — Progress Notes (Signed)
COVID Vaccine Completed: yes Date COVID Vaccine completed: 08/09/19 COVID vaccine manufacturer: *[employer]   PCP - Dr. Merri Ray. LOV:  Cardiologist - NO  Chest x-ray - 01/13/20 EKG - 01/13/20: 01/13/20 EPIC Stress Test -  ECHO -  Cardiac Cath -   Sleep Study -  CPAP -   Fasting Blood Sugar -  Checks Blood Sugar _____ times a day  Blood Thinner Instructions: Aspirin Instructions: Last Dose:  Anesthesia review:   Patient denies shortness of breath, fever, cough and chest pain at PAT appointment   Patient verbalized understanding of instructions that were given to them at the PAT appointment. Patient was also instructed that they will need to review over the PAT instructions again at home before surgery.

## 2020-02-03 ENCOUNTER — Other Ambulatory Visit (HOSPITAL_COMMUNITY)
Admission: RE | Admit: 2020-02-03 | Discharge: 2020-02-03 | Disposition: A | Discharge status: Home / Self Care | Payer: Medicare HMO | Source: Ambulatory Visit | Attending: Orthopedic Surgery | Admitting: Orthopedic Surgery

## 2020-02-03 DIAGNOSIS — Z01812 Encounter for preprocedural laboratory examination: Secondary | ICD-10-CM | POA: Insufficient documentation

## 2020-02-03 DIAGNOSIS — Z20822 Contact with and (suspected) exposure to covid-19: Secondary | ICD-10-CM | POA: Diagnosis not present

## 2020-02-03 LAB — SARS CORONAVIRUS 2 (TAT 6-24 HRS): SARS Coronavirus 2: NEGATIVE

## 2020-02-05 NOTE — Progress Notes (Addendum)
Rodney Bailey made aware to arrive at 7:45 AM on 02/13/2020 drink pre surgery drink at 7:15 AM. Patient verbalized understanding.  Per [name] P.A. labs from 01/28/2020 are good for procedure 02/13/2020.

## 2020-02-11 ENCOUNTER — Other Ambulatory Visit (HOSPITAL_COMMUNITY)
Admission: RE | Admit: 2020-02-11 | Discharge: 2020-02-11 | Disposition: A | Discharge status: Home / Self Care | Payer: Medicare HMO | Source: Ambulatory Visit | Attending: Orthopedic Surgery | Admitting: Orthopedic Surgery

## 2020-02-11 DIAGNOSIS — Z20822 Contact with and (suspected) exposure to covid-19: Secondary | ICD-10-CM | POA: Insufficient documentation

## 2020-02-11 DIAGNOSIS — Z01812 Encounter for preprocedural laboratory examination: Secondary | ICD-10-CM | POA: Diagnosis not present

## 2020-02-11 LAB — SARS CORONAVIRUS 2 (TAT 6-24 HRS): SARS Coronavirus 2: NEGATIVE

## 2020-02-13 ENCOUNTER — Ambulatory Visit (HOSPITAL_COMMUNITY): Payer: Medicare HMO | Admitting: Anesthesiology

## 2020-02-13 ENCOUNTER — Ambulatory Visit (HOSPITAL_COMMUNITY): Payer: Medicare HMO | Admitting: Physician Assistant

## 2020-02-13 ENCOUNTER — Encounter (HOSPITAL_COMMUNITY)
Admission: RE | Disposition: A | Discharge status: Home / Self Care | Payer: Self-pay | Source: Home / Self Care | Attending: Orthopedic Surgery

## 2020-02-13 ENCOUNTER — Encounter (HOSPITAL_COMMUNITY): Payer: Self-pay | Admitting: Orthopedic Surgery

## 2020-02-13 ENCOUNTER — Ambulatory Visit (HOSPITAL_COMMUNITY)
Admission: RE | Admit: 2020-02-13 | Discharge: 2020-02-13 | Disposition: A | Discharge status: Home / Self Care | Payer: Medicare HMO | Attending: Orthopedic Surgery | Admitting: Orthopedic Surgery

## 2020-02-13 DIAGNOSIS — G8918 Other acute postprocedural pain: Secondary | ICD-10-CM | POA: Diagnosis not present

## 2020-02-13 DIAGNOSIS — Z79899 Other long term (current) drug therapy: Secondary | ICD-10-CM | POA: Diagnosis not present

## 2020-02-13 DIAGNOSIS — M19012 Primary osteoarthritis, left shoulder: Secondary | ICD-10-CM | POA: Diagnosis not present

## 2020-02-13 DIAGNOSIS — M1612 Unilateral primary osteoarthritis, left hip: Secondary | ICD-10-CM | POA: Diagnosis not present

## 2020-02-13 DIAGNOSIS — Z96612 Presence of left artificial shoulder joint: Secondary | ICD-10-CM

## 2020-02-13 DIAGNOSIS — E785 Hyperlipidemia, unspecified: Secondary | ICD-10-CM | POA: Insufficient documentation

## 2020-02-13 HISTORY — PX: TOTAL SHOULDER ARTHROPLASTY: SHX126

## 2020-02-13 SURGERY — ARTHROPLASTY, SHOULDER, TOTAL
Anesthesia: Regional | Site: Shoulder | Laterality: Left

## 2020-02-13 MED ORDER — STERILE WATER FOR IRRIGATION IR SOLN
Status: DC | PRN
  Administered 2020-02-13: 2000 mL

## 2020-02-13 MED ORDER — CEFAZOLIN SODIUM-DEXTROSE 2-4 GM/100ML-% IV SOLN
2.0000 g | INTRAVENOUS | Status: AC
  Administered 2020-02-13: 2 g via INTRAVENOUS
  Filled 2020-02-13: qty 100

## 2020-02-13 MED ORDER — 0.9 % SODIUM CHLORIDE (POUR BTL) OPTIME
TOPICAL | Status: DC | PRN
  Administered 2020-02-13: 1000 mL

## 2020-02-13 MED ORDER — HYDROCODONE-ACETAMINOPHEN 5-325 MG PO TABS: 1.0000 | ORAL_TABLET | Freq: Four times a day (QID) | ORAL | 0 refills | Status: AC | PRN

## 2020-02-13 MED ORDER — FENTANYL CITRATE (PF) 100 MCG/2ML IJ SOLN
50.0000 ug | INTRAMUSCULAR | Status: DC
  Administered 2020-02-13: 50 ug via INTRAVENOUS
  Filled 2020-02-13: qty 2

## 2020-02-13 MED ORDER — NAPROXEN 500 MG PO TABS: 500.0000 mg | ORAL_TABLET | Freq: Two times a day (BID) | ORAL | 1 refills | Status: DC

## 2020-02-13 MED ORDER — ONDANSETRON HCL 4 MG PO TABS: 4.0000 mg | ORAL_TABLET | Freq: Three times a day (TID) | ORAL | 0 refills | Status: DC | PRN

## 2020-02-13 MED ORDER — SUGAMMADEX SODIUM 200 MG/2ML IV SOLN
INTRAVENOUS | Status: DC | PRN
  Administered 2020-02-13: 200 mg via INTRAVENOUS

## 2020-02-13 MED ORDER — ROCURONIUM BROMIDE 10 MG/ML (PF) SYRINGE
PREFILLED_SYRINGE | INTRAVENOUS | Status: AC
  Filled 2020-02-13: qty 10

## 2020-02-13 MED ORDER — ROCURONIUM BROMIDE 10 MG/ML (PF) SYRINGE
PREFILLED_SYRINGE | INTRAVENOUS | Status: DC | PRN
  Administered 2020-02-13: 20 mg via INTRAVENOUS
  Administered 2020-02-13: 80 mg via INTRAVENOUS

## 2020-02-13 MED ORDER — ACETAMINOPHEN 500 MG PO TABS
1000.0000 mg | ORAL_TABLET | Freq: Once | ORAL | Status: AC
  Administered 2020-02-13: 1000 mg via ORAL
  Filled 2020-02-13: qty 2

## 2020-02-13 MED ORDER — PROPOFOL 10 MG/ML IV BOLUS
INTRAVENOUS | Status: AC
  Filled 2020-02-13: qty 20

## 2020-02-13 MED ORDER — ORAL CARE MOUTH RINSE: 15.0000 mL | Freq: Once | OROMUCOSAL | Status: AC

## 2020-02-13 MED ORDER — DEXAMETHASONE SODIUM PHOSPHATE 10 MG/ML IJ SOLN
INTRAMUSCULAR | Status: AC
  Filled 2020-02-13: qty 1

## 2020-02-13 MED ORDER — LIDOCAINE 2% (20 MG/ML) 5 ML SYRINGE
INTRAMUSCULAR | Status: DC | PRN
  Administered 2020-02-13: 20 mg via INTRAVENOUS
  Administered 2020-02-13: 60 mg via INTRAVENOUS

## 2020-02-13 MED ORDER — FENTANYL CITRATE (PF) 100 MCG/2ML IJ SOLN
INTRAMUSCULAR | Status: DC | PRN
Start: 2020-02-13 — End: 2020-02-13
  Administered 2020-02-13: 50 ug via INTRAVENOUS

## 2020-02-13 MED ORDER — CYCLOBENZAPRINE HCL 10 MG PO TABS: 10.0000 mg | ORAL_TABLET | Freq: Three times a day (TID) | ORAL | 1 refills | Status: DC | PRN

## 2020-02-13 MED ORDER — MIDAZOLAM HCL 2 MG/2ML IJ SOLN
1.0000 mg | INTRAMUSCULAR | Status: DC
  Administered 2020-02-13: 2 mg via INTRAVENOUS
  Filled 2020-02-13: qty 2

## 2020-02-13 MED ORDER — DEXAMETHASONE SODIUM PHOSPHATE 10 MG/ML IJ SOLN
INTRAMUSCULAR | Status: DC | PRN
  Administered 2020-02-13: 10 mg via INTRAVENOUS

## 2020-02-13 MED ORDER — FENTANYL CITRATE (PF) 100 MCG/2ML IJ SOLN: 25.0000 ug | INTRAMUSCULAR | Status: DC | PRN

## 2020-02-13 MED ORDER — BUPIVACAINE LIPOSOME 1.3 % IJ SUSP
INTRAMUSCULAR | Status: DC | PRN
  Administered 2020-02-13: 10 mL via PERINEURAL

## 2020-02-13 MED ORDER — LACTATED RINGERS IV SOLN: INTRAVENOUS | Status: DC

## 2020-02-13 MED ORDER — PROPOFOL 10 MG/ML IV BOLUS
INTRAVENOUS | Status: DC | PRN
  Administered 2020-02-13: 120 mg via INTRAVENOUS

## 2020-02-13 MED ORDER — PHENYLEPHRINE HCL-NACL 10-0.9 MG/250ML-% IV SOLN
INTRAVENOUS | Status: DC | PRN
  Administered 2020-02-13: 25 ug/min via INTRAVENOUS

## 2020-02-13 MED ORDER — CHLORHEXIDINE GLUCONATE 0.12 % MT SOLN
15.0000 mL | Freq: Once | OROMUCOSAL | Status: AC
  Administered 2020-02-13: 15 mL via OROMUCOSAL

## 2020-02-13 MED ORDER — TRANEXAMIC ACID-NACL 1000-0.7 MG/100ML-% IV SOLN
1000.0000 mg | INTRAVENOUS | Status: AC
  Administered 2020-02-13: 1000 mg via INTRAVENOUS
  Filled 2020-02-13: qty 100

## 2020-02-13 MED ORDER — ONDANSETRON HCL 4 MG/2ML IJ SOLN
INTRAMUSCULAR | Status: DC | PRN
  Administered 2020-02-13: 4 mg via INTRAVENOUS

## 2020-02-13 MED ORDER — LACTATED RINGERS IV BOLUS
500.0000 mL | Freq: Once | INTRAVENOUS | Status: AC
  Administered 2020-02-13: 500 mL via INTRAVENOUS

## 2020-02-13 MED ORDER — BUPIVACAINE HCL (PF) 0.5 % IJ SOLN
INTRAMUSCULAR | Status: DC | PRN
  Administered 2020-02-13: 15 mL via PERINEURAL

## 2020-02-13 MED ORDER — FENTANYL CITRATE (PF) 100 MCG/2ML IJ SOLN
INTRAMUSCULAR | Status: AC
  Filled 2020-02-13: qty 2

## 2020-02-13 MED ORDER — LIDOCAINE 2% (20 MG/ML) 5 ML SYRINGE
INTRAMUSCULAR | Status: AC
  Filled 2020-02-13: qty 10

## 2020-02-13 MED ORDER — LACTATED RINGERS IV BOLUS: 250.0000 mL | Freq: Once | INTRAVENOUS | Status: DC

## 2020-02-13 MED ORDER — ONDANSETRON HCL 4 MG/2ML IJ SOLN
INTRAMUSCULAR | Status: AC
  Filled 2020-02-13: qty 2

## 2020-02-13 SURGICAL SUPPLY — 63 items
BAG ZIPLOCK 12X15 (MISCELLANEOUS) ×2 IMPLANT
BIT DRILL 2.0X128 (BIT) ×2 IMPLANT
BLADE SAW SGTL 83.5X18.5 (BLADE) ×2 IMPLANT
BODY TRUNION ECLIPSE 45 SL (Shoulder) ×2 IMPLANT
CEMENT BONE DEPUY (Cement) ×2 IMPLANT
COOLER ICEMAN CLASSIC (MISCELLANEOUS) ×2 IMPLANT
COVER BACK TABLE 60X90IN (DRAPES) ×2 IMPLANT
COVER SURGICAL LIGHT HANDLE (MISCELLANEOUS) ×2 IMPLANT
COVER WAND RF STERILE (DRAPES) ×2 IMPLANT
DERMABOND ADVANCED (GAUZE/BANDAGES/DRESSINGS) ×1
DERMABOND ADVANCED .7 DNX12 (GAUZE/BANDAGES/DRESSINGS) ×1 IMPLANT
DRAPE SHEET LG 3/4 BI-LAMINATE (DRAPES) ×2 IMPLANT
DRAPE SURG 17X11 SM STRL (DRAPES) ×2 IMPLANT
DRAPE U-SHAPE 47X51 STRL (DRAPES) ×2 IMPLANT
DRESSING AQUACEL AG SP 3.5X10 (GAUZE/BANDAGES/DRESSINGS) ×1 IMPLANT
DRSG AQUACEL AG ADV 3.5X10 (GAUZE/BANDAGES/DRESSINGS) ×2 IMPLANT
DRSG AQUACEL AG SP 3.5X10 (GAUZE/BANDAGES/DRESSINGS) ×2
DURAPREP 26ML APPLICATOR (WOUND CARE) ×4 IMPLANT
ELECT BLADE TIP CTD 4 INCH (ELECTRODE) ×2 IMPLANT
ELECT REM PT RETURN 15FT ADLT (MISCELLANEOUS) ×2 IMPLANT
FACESHIELD WRAPAROUND (MASK) ×8 IMPLANT
GLENOID UNI VAULTLOCK LRG (Shoulder) ×2 IMPLANT
GLOVE BIO SURGEON STRL SZ7.5 (GLOVE) ×2 IMPLANT
GLOVE BIO SURGEON STRL SZ8 (GLOVE) ×2 IMPLANT
GLOVE SS BIOGEL STRL SZ 7 (GLOVE) ×1 IMPLANT
GLOVE SS BIOGEL STRL SZ 7.5 (GLOVE) ×1 IMPLANT
GLOVE SUPERSENSE BIOGEL SZ 7 (GLOVE) ×1
GLOVE SUPERSENSE BIOGEL SZ 7.5 (GLOVE) ×1
GOWN STRL REUS W/TWL LRG LVL3 (GOWN DISPOSABLE) ×4 IMPLANT
HEAD HUM ECLIPSE 45/19 (Shoulder) ×2 IMPLANT
IMPL ECLIPSE SPEEDCAP (Shoulder) ×1 IMPLANT
IMPLANT ECLIPSE SPEEDCAP (Shoulder) ×2 IMPLANT
KIT BASIN OR (CUSTOM PROCEDURE TRAY) ×2 IMPLANT
KIT TURNOVER KIT A (KITS) IMPLANT
MANIFOLD NEPTUNE II (INSTRUMENTS) ×2 IMPLANT
MARKER SKIN DUAL TIP RULER LAB (MISCELLANEOUS) ×2 IMPLANT
NEEDLE TAPERED W/ NITINOL LOOP (MISCELLANEOUS) IMPLANT
NS IRRIG 1000ML POUR BTL (IV SOLUTION) ×2 IMPLANT
PACK SHOULDER (CUSTOM PROCEDURE TRAY) ×2 IMPLANT
PAD COLD SHLDR WRAP-ON (PAD) ×2 IMPLANT
PIN NITINOL TARGETER 2.8 (PIN) IMPLANT
PIN SET MODULAR GLENOID SYSTEM (PIN) ×2 IMPLANT
PROTECTOR NERVE ULNAR (MISCELLANEOUS) ×2 IMPLANT
RESTRAINT HEAD UNIVERSAL NS (MISCELLANEOUS) ×2 IMPLANT
SCREW SPINAL 40 F/CAGE LRG (Screw) ×2 IMPLANT
SIZER ECLIPSE CAGE SCREW (ORTHOPEDIC DISPOSABLE SUPPLIES) ×2 IMPLANT
SLING ARM FOAM STRAP LRG (SOFTGOODS) ×2 IMPLANT
SLING ARM FOAM STRAP MED (SOFTGOODS) IMPLANT
SMARTMIX MINI TOWER (MISCELLANEOUS) ×2
SPONGE LAP 18X18 RF (DISPOSABLE) ×2 IMPLANT
SUCTION FRAZIER HANDLE 12FR (TUBING) ×2
SUCTION TUBE FRAZIER 12FR DISP (TUBING) ×1 IMPLANT
SUT FIBERWIRE #2 38 T-5 BLUE (SUTURE)
SUT MNCRL AB 3-0 PS2 18 (SUTURE) ×2 IMPLANT
SUT MON AB 2-0 CT1 36 (SUTURE) ×2 IMPLANT
SUT VIC AB 1 CT1 36 (SUTURE) ×6 IMPLANT
SUTURE FIBERWR #2 38 T-5 BLUE (SUTURE) IMPLANT
SUTURE TAPE 1.3 40 TPR END (SUTURE) IMPLANT
SUTURETAPE 1.3 40 TPR END (SUTURE)
TOWEL OR 17X26 10 PK STRL BLUE (TOWEL DISPOSABLE) ×2 IMPLANT
TOWEL OR NON WOVEN STRL DISP B (DISPOSABLE) ×2 IMPLANT
TOWER SMARTMIX MINI (MISCELLANEOUS) ×1 IMPLANT
WATER STERILE IRR 1000ML POUR (IV SOLUTION) ×4 IMPLANT

## 2020-02-13 NOTE — Anesthesia Preprocedure Evaluation (Addendum)
Anesthesia Evaluation  Patient identified by MRN, date of birth, ID band Patient awake    Reviewed: Allergy & Precautions, NPO status , Patient's Chart, lab work & pertinent test results  Airway Mallampati: II  TM Distance: >3 FB Neck ROM: Full    Dental no notable dental hx. (+) Teeth Intact, Dental Advisory Given   Pulmonary neg pulmonary ROS,    Pulmonary exam normal breath sounds clear to auscultation       Cardiovascular Normal cardiovascular exam Rhythm:Regular Rate:Normal  HLD   Neuro/Psych negative neurological ROS  negative psych ROS   GI/Hepatic negative GI ROS, Neg liver ROS,   Endo/Other  negative endocrine ROS  Renal/GU negative Renal ROS  negative genitourinary   Musculoskeletal  (+) Arthritis ,   Abdominal   Peds  Hematology negative hematology ROS (+)   Anesthesia Other Findings   Reproductive/Obstetrics                            Anesthesia Physical Anesthesia Plan  ASA: II  Anesthesia Plan: General and Regional   Post-op Pain Management:  Regional for Post-op pain   Induction: Intravenous  PONV Risk Score and Plan: 2 and Midazolam, Dexamethasone and Ondansetron  Airway Management Planned: Oral ETT  Additional Equipment:   Intra-op Plan:   Post-operative Plan: Extubation in OR  Informed Consent: I have reviewed the patients History and Physical, chart, labs and discussed the procedure including the risks, benefits and alternatives for the proposed anesthesia with the patient or authorized representative who has indicated his/her understanding and acceptance.     Dental advisory given  Plan Discussed with: CRNA  Anesthesia Plan Comments:         Anesthesia Quick Evaluation

## 2020-02-13 NOTE — Anesthesia Procedure Notes (Signed)
Procedure Name: Intubation Date/Time: 02/13/2020 11:18 AM Performed by: Lavina Hamman, CRNA Pre-anesthesia Checklist: Patient identified, Emergency Drugs available, Suction available and Patient being monitored Patient Re-evaluated:Patient Re-evaluated prior to induction Oxygen Delivery Method: Circle System Utilized Preoxygenation: Pre-oxygenation with 100% oxygen Induction Type: IV induction Ventilation: Mask ventilation without difficulty Laryngoscope Size: Mac and 3 Grade View: Grade I Tube type: Oral Tube size: 7.5 mm Number of attempts: 1 Airway Equipment and Method: Stylet and Oral airway Placement Confirmation: ETT inserted through vocal cords under direct vision,  positive ETCO2 and breath sounds checked- equal and bilateral Secured at: 23 cm Tube secured with: Tape Dental Injury: Teeth and Oropharynx as per pre-operative assessment

## 2020-02-13 NOTE — Evaluation (Signed)
Occupational Therapy Evaluation Patient Details Name: Rodney Bailey. MRN: [id number] DOB: Oct 21, 1950 Today's Date: 68/02/2020    History of Present Illness Patient is a 69 year old man s/p left TSA.   Clinical Impression   Patient is a 69 year old man s/p left TSA s/p shoulder replacement without functional use of left non-dominant upper extremity secondary to effects of surgery and interscalene block and shoulder precautions. Therapist provided education and instruction to patient in regards to exercises, precautions, positioning, donning upper extremity clothing and bathing while maintaining shoulder precautions, ice and edema management and donning/doffing sling. Patient verbalized understanding. Patient needed assistance to donn shirt and patient able to donn underwear, pants, and shoes with modified independence. Patient to follow up with MD for further therapy needs.      Follow Up Recommendations  Follow surgeons recommendation for DC plan and follow-up therapies    Equipment Recommendations  None recommended by OT    Recommendations for Other Services       Precautions / Restrictions Precautions Precautions: Shoulder Shoulder Interventions: Shoulder sling/immobilizer;At all times;Off for dressing/bathing/exercises Required Braces or Orthoses: Sling Restrictions Weight Bearing Restrictions: Yes LUE Weight Bearing: Non weight bearing      Mobility Bed Mobility                  Transfers Overall transfer level: Modified independent                    Balance Overall balance assessment: No apparent balance deficits (not formally assessed)                                         ADL either performed or assessed with clinical judgement   ADL Overall ADL's : Needs assistance/impaired Eating/Feeding: Set up   Grooming: Modified independent   Upper Body Bathing: Modified independent   Lower Body Bathing: Modified independent    Upper Body Dressing : Maximal assistance   Lower Body Dressing: Modified independent   Toilet Transfer: Modified Independent   Toileting- Clothing Manipulation and Hygiene: Modified independent       Functional mobility during ADLs: Supervision/safety       Vision Baseline Vision/History: Wears glasses Wears Glasses: At all times       Perception     Praxis      Pertinent Vitals/Pain Pain Assessment: No/denies pain     Hand Dominance     Extremity/Trunk Assessment Upper Extremity Assessment Upper Extremity Assessment: LUE deficits/detail LUE Deficits / Details: Non functional use of LUE secondary to interscalene block.           Communication     Cognition Arousal/Alertness: Awake/alert Behavior During Therapy: WFL for tasks assessed/performed Overall Cognitive Status: Within Functional Limits for tasks assessed                                     General Comments       Exercises     Shoulder Instructions Shoulder Instructions Donning/doffing shirt without moving shoulder: Maximal assistance;Patient able to independently direct caregiver Donning/doffing sling/immobilizer: Patient able to independently direct caregiver;Maximal assistance Correct positioning of sling/immobilizer: Independent Pendulum exercises (written home exercise program): Independent ROM for elbow, wrist and digits of operated UE: Independent Sling wearing schedule (on at all times/off for ADL's): Independent Proper positioning of  operated UE when showering: Independent Dressing change: Independent Positioning of UE while sleeping: [place] expects to be discharged to:: Private residence Living Arrangements: Spouse/significant other Available Help at Discharge: Family;Available 24 hours/day Type of Home: House Home Access: Stairs to enter [employer] of Steps: 1 Entrance Stairs-Rails: None Home Layout: One level      Bathroom Shower/Tub: Occupational psychologist: Standard Bathroom Accessibility: Yes   Home Equipment: Environmental consultant - 2 wheels;Bedside commode;Cane - single point          Prior Functioning/Environment Level of Independence: Independent;Independent with assistive device(s)                 OT Problem List: Decreased strength;Decreased range of motion;Impaired UE functional use      OT Treatment/Interventions:      OT Goals(Current goals can be found in the care plan section) Acute Rehab OT Goals Patient Stated Goal: To go home OT Goal Formulation: All assessment and education complete, DC therapy  OT Frequency:     Barriers to D/C:            Co-evaluation              AM-PAC OT "6 Clicks" Daily Activity     Outcome Measure Help from another person eating meals?: A Little Help from another person taking care of personal grooming?: None Help from another person toileting, which includes using toliet, bedpan, or urinal?: None Help from another person bathing (including washing, rinsing, drying)?: None Help from another person to put on and taking off regular upper body clothing?: A Lot Help from another person to put on and taking off regular lower body clothing?: None 6 Click Score: 21   End of Session Nurse Communication:  (OT education complete)  Activity Tolerance: Patient tolerated treatment well Patient left: in chair  OT Visit Diagnosis: Muscle weakness (generalized) (M62.81)                Time: 1610-1640 OT Time Calculation (min): 30 min Charges:  OT General Charges $OT Visit: 1 Visit OT Evaluation $OT Eval Low Complexity: 1 Low OT Treatments $Self Care/Home Management : 8-22 mins  Jeanise Durfey, OTR/L [place]  Office [phone] Pager: [phone]   Lenward Chancellor 02/13/2020, 4:56 PM

## 2020-02-13 NOTE — Discharge Instructions (Signed)
 Kevin M. Supple, M.D., F.A.A.O.S. Orthopaedic Surgery Specializing in Arthroscopic and Reconstructive Surgery of the Shoulder [phone] [address] - [place], [place] [address] - Fax [phone]   POST-OP TOTAL SHOULDER REPLACEMENT INSTRUCTIONS  1. Follow up in the office for your first post-op appointment 10-14 days from the date of your surgery. If you do not already have a scheduled appointment, our office will contact you to schedule.  2. The bandage over your incision is waterproof. You may begin showering with this dressing on. You may leave this dressing on until first follow up appointment within 2 weeks. We prefer you leave this dressing in place until follow up however after 5-7 days if you are having itching or skin irritation and would like to remove it you may do so. Go slow and tug at the borders gently to break the bond the dressing has with the skin. At this point if there is no drainage it is okay to go without a bandage or you may cover it with a light guaze and tape. You can also expect significant bruising around your shoulder that will drift down your arm and into your chest wall. This is very normal and should resolve over several days.   3. Wear your sling/immobilizer at all times except to perform the exercises below or to occasionally let your arm dangle by your side to stretch your elbow. You also need to sleep in your sling immobilizer until instructed otherwise. It is ok to remove your sling if you are sitting in a controlled environment and allow your arm to rest in a position of comfort by your side or on your lap with pillows to give your neck and skin a break from the sling. You may remove it to allow arm to dangle by side to shower. If you are up walking around and when you go to sleep at night you need to wear it.  4. Range of motion to your elbow, wrist, and hand are encouraged 3-5 times daily. Exercise to your hand and fingers helps to reduce  swelling you may experience.   5. Prescriptions for a pain medication and a muscle relaxant are provided for you. It is recommended that if you are experiencing pain that you pain medication alone is not controlling, add the muscle relaxant along with the pain medication which can give additional pain relief. The first 1-2 days is generally the most severe of your pain and then should gradually decrease. As your pain lessens it is recommended that you decrease your use of the pain medications to an "as needed basis'" only and to always comply with the recommended dosages of the pain medications.  6. Pain medications can produce constipation along with their use. If you experience this, the use of an over the counter stool softener or laxative daily is recommended.   7. For additional questions or concerns, please do not hesitate to call the office. If after hours there is an answering service to forward your concerns to the physician on call.  8.Pain control following an exparel block  To help control your post-operative pain you received a nerve block  performed with Exparel which is a long acting anesthetic (numbing agent) which can provide pain relief and sensations of numbness (and relief of pain) in the operative shoulder and arm for up to 3 days. Sometimes it provides mixed relief, meaning you may still have numbness in certain areas of the arm but can still be able to   move  parts of that arm, hand, and fingers. We recommend that your prescribed pain medications  be used as needed. We do not feel it is necessary to "pre medicate" and "stay ahead" of pain.  Taking narcotic pain medications when you are not having any pain can lead to unnecessary and potentially dangerous side effects.    9. Use the ice machine as much as possible in the first 5-7 days from surgery, then you can wean its use to as needed. The ice typically needs to be replaced every 6 hours, instead of ice you can actually freeze  water bottles to put in the cooler and then fill water around them to avoid having to purchase ice. You can have spare water bottles freezing to allow you to rotate them once they have melted. Try to have a thin shirt or light cloth or towel under the ice wrap to protect your skin.   10.  We recommend that you avoid any dental work or cleaning in the first 3 months following your joint replacement. This is to help minimize the possibility of infection from the bacteria in your mouth that enters your bloodstream during dental work. We also recommend that you take an antibiotic prior to your dental work for the first year after your shoulder replacement to further help reduce that risk. Please simply contact our office for antibiotics to be sent to your pharmacy prior to dental work.  11. Dental Antibiotics:  In most cases prophylactic antibiotics for Dental procdeures after total joint surgery are not necessary.  Exceptions are as follows:  1. History of prior total joint infection  2. Severely immunocompromised (Organ Transplant, cancer chemotherapy, Rheumatoid biologic meds such as Emmet)  3. Poorly controlled diabetes (A1C &gt; 8.0, blood glucose over 200)  If you have one of these conditions, contact your surgeon for an antibiotic prescription, prior to your dental procedure.   POST-OP EXERCISES  Pendulum Exercises  Perform pendulum exercises while standing and bending at the waist. Support your uninvolved arm on a table or chair and allow your operated arm to hang freely. Make sure to do these exercises passively - not using you shoulder muscles. These exercises can be performed once your nerve block effects have worn off.  Repeat 20 times. Do 3 sessions per day.      General Anesthesia, Adult, Care After This sheet gives you information about how to care for yourself after your procedure. Your health care provider may also give you more specific instructions. If you have  problems or questions, contact your health care provider. What can I expect after the procedure? After the procedure, the following side effects are common:  Pain or discomfort at the IV site.  Nausea.  Vomiting.  Sore throat.  Trouble concentrating.  Feeling cold or chills.  Weak or tired.  Sleepiness and fatigue.  Soreness and body aches. These side effects can affect parts of the body that were not involved in surgery. Follow these instructions at home:  For at least 24 hours after the procedure:  Have a responsible adult stay with you. It is important to have someone help care for you until you are awake and alert.  Rest as needed.  Do not: ? Participate in activities in which you could fall or become injured. ? Drive. ? Use heavy machinery. ? Drink alcohol. ? Take sleeping pills or medicines that cause drowsiness. ? Make important decisions or sign legal documents. ? Take care of children on your  own. Eating and drinking  Follow any instructions from your health care provider about eating or drinking restrictions.  When you feel hungry, start by eating small amounts of foods that are soft and easy to digest (bland), such as toast. Gradually return to your regular diet.  Drink enough fluid to keep your urine pale yellow.  If you vomit, rehydrate by drinking water, juice, or clear broth. General instructions  If you have sleep apnea, surgery and certain medicines can increase your risk for breathing problems. Follow instructions from your health care provider about wearing your sleep device: ? Anytime you are sleeping, including during daytime naps. ? While taking prescription pain medicines, sleeping medicines, or medicines that make you drowsy.  Return to your normal activities as told by your health care provider. Ask your health care provider what activities are safe for you.  Take over-the-counter and prescription medicines only as told by your health  care provider.  If you smoke, do not smoke without supervision.  Keep all follow-up visits as told by your health care provider. This is important. Contact a health care provider if:  You have nausea or vomiting that does not get better with medicine.  You cannot eat or drink without vomiting.  You have pain that does not get better with medicine.  You are unable to pass urine.  You develop a skin rash.  You have a fever.  You have redness around your IV site that gets worse. Get help right away if:  You have difficulty breathing.  You have chest pain.  You have blood in your urine or stool, or you vomit blood. Summary  After the procedure, it is common to have a sore throat or nausea. It is also common to feel tired.  Have a responsible adult stay with you for the first 24 hours after general anesthesia. It is important to have someone help care for you until you are awake and alert.  When you feel hungry, start by eating small amounts of foods that are soft and easy to digest (bland), such as toast. Gradually return to your regular diet.  Drink enough fluid to keep your urine pale yellow.  Return to your normal activities as told by your health care provider. Ask your health care provider what activities are safe for you. This information is not intended to replace advice given to you by your health care provider. Make sure you discuss any questions you have with your health care provider. Document Revised: 05/26/2017 Document Reviewed: 01/06/2017 Elsevier Patient Education  [place].

## 2020-02-13 NOTE — Anesthesia Procedure Notes (Signed)
Anesthesia Regional Block: Interscalene brachial plexus block   Pre-Anesthetic Checklist: ,, timeout performed, Correct Patient, Correct Site, Correct Laterality, Correct Procedure, Correct Position, site marked, Risks and benefits discussed,  Surgical consent,  Pre-op evaluation,  At surgeon's request and post-op pain management  Laterality: Left  Prep: Maximum Sterile Barrier Precautions used, chloraprep       Needles:  Injection technique: Single-shot  Needle Type: Echogenic Stimulator Needle     Needle Length: 4cm  Needle Gauge: 22     Additional Needles:   Procedures:,,,, ultrasound used (permanent image in chart),,,,  Narrative:  Start time: 02/13/2020 8:31 AM End time: 02/13/2020 8:41 AM Injection made incrementally with aspirations every 5 mL.  Performed by: Personally  Anesthesiologist: Freddrick March, MD  Additional Notes: Monitors applied. No increased pain on injection. No increased resistance to injection. Injection made in 5cc increments. Good needle visualization. Patient tolerated procedure well.

## 2020-02-13 NOTE — Transfer of Care (Signed)
Immediate Anesthesia Transfer of Care Note  Patient: Rodney Bailey.  Procedure(s) Performed: TOTAL SHOULDER ARTHROPLASTY (Left Shoulder)  Patient Location: PACU  Anesthesia Type:General  Level of Consciousness: awake, alert  and oriented  Airway & Oxygen Therapy: Patient Spontanous Breathing and Patient connected to face mask  Post-op Assessment: Report given to RN, Post -op Vital signs reviewed and stable and Patient moving all extremities  Post vital signs: Reviewed and stable  Last Vitals:  Vitals Value Taken Time  BP 151/87 02/13/20 1319  Temp    Pulse 62 02/13/20 1321  Resp 16 02/13/20 1321  SpO2 100 % 02/13/20 1321  Vitals shown include unvalidated device data.  Last Pain:  Vitals:   02/13/20 0748  TempSrc: Oral         Complications: No complications documented.

## 2020-02-13 NOTE — Progress Notes (Signed)
AssistedDr. Chelsey Woodrum with left, ultrasound guided, interscalene  block. Side rails up, monitors on throughout procedure. See vital signs in flow sheet. Tolerated Procedure well.  

## 2020-02-13 NOTE — Op Note (Signed)
02/13/2020  1:16 PM  PATIENT:   Rodney Bailey.  69 y.o. male  PRE-OPERATIVE DIAGNOSIS:  Left shoulder osteoarthritis  POST-OPERATIVE DIAGNOSIS: Same  PROCEDURE: Left shoulder stemless anatomic arthroplasty utilizing a 45 x 19 Arthrex humeral head with a 45 trunnion and a large cage screw and a large cemented glenoid  SURGEON:  Ranjit Ashurst, Metta Clines M.D.  ASSISTANTS: Jenetta Loges, PA-C  ANESTHESIA:   General endotracheal and interscalene block with Exparel  EBL: 200 cc  SPECIMEN: None  Drains: None   PATIENT DISPOSITION:  PACU - hemodynamically stable.    PLAN OF CARE: Discharge to home after PACU  Brief history:  Patient is a 69 year old male who has had chronic and progressively increasing left shoulder pain related to advanced osteoarthritis.  His plain radiographs show complete obliteration of the joint space with subchondral sclerosis and multiple subchondral cysts with peripheral osteophytes.  Due to his increasing pain and functional rotations and failure to respond to conservative management he is brought to the operating this time for planned left anatomic total shoulder arthroplasty.  Preoperatively, I counseled the patient regarding treatment options and risks versus benefits thereof.  Possible surgical complications were all reviewed including potential for bleeding, infection, neurovascular injury, persistent pain, loss of motion, anesthetic complication, failure of the implant, and possible need for additional surgery. They understand and accept and agrees with our planned procedure.   Procedure in detail:  After undergoing routine preop evaluation patient received prophylactic antibiotics and interscalene block with Exparel was established in the holding area by the anesthesia department.  Patient subsequently placed supine on the operating table and underwent the smooth induction of a general endotracheal anesthesia.  Patient then was placed in the beachchair  position and appropriately padded and protected.  The left shoulder girdle region was sterilely prepped and draped in standard fashion.  Timeout was called.  An anterior deltopectoral approach left shoulder is made through 10 cm incision.  Skin flaps were elevated dissection carried deeply and the deltopectoral interval was then developed from proximal to distal with the vein taken laterally.  The upper centimeter of the pectoralis major tendon was tenotomized for exposure the conjoined tendon was mobilized and retracted medially and adhesions were divided beneath the deltoid.  The long head biceps tendon was then tenodesed at the upper border the pectoralis major tendon with proximal segment unroofed and excised.  The rotator cuff was then split along the rotator interval to the base of the coracoid and we then outlined the insertions of the subscapularis and superiorly and inferiorly.  An oscillating saw was then used to perform a lesser tuberosity osteotomy removing an approximately 2 mm wafer of bone.  The subscapularis was then tagged and reflected medially and the capsular attachments were then divided from the anterior and infra margins of the humeral neck allowing deliver the humeral head through the wound.  We outlined our proposed humeral head resection using intramedullary guide the cut was then completed with an oscillating saw with care taken to protect the rotator cuff superiorly and posteriorly.  Marginal osteophytes were then removed with a rondure.  The humeral metaphysis was then measured and a 45 trunnion showed the best coverage.  We went ahead and prepared the metaphysis for the future trunnion with the appropriate cutting guide and the humeral metaphysis was then protected with a metal cap.  At this point we exposed the glenoid with appropriate retractors and performed a circumferential labral resection gaining complete visualization of the periphery  of the glenoid.  This was sized at a large  and a guidepin was then directed into the center of the glenoid and the glenoid was then reamed down to stable subchondral bony surface and the marginal debris was then removed.  Glenoid preparation was then completed with our central drill followed by the guide for the superior and inferior peg and slot respectively.  The glenoid was then broached and a trial implant showed excellent fit and fixation.  At this point was cement was then mixed and introduced into the superior and inferior peg and slot respectively and the final glenoid with bone packed around the central peg was then impacted with excellent fit and fixation.  We then returned our attention back to the proximal humerus with the size [place] was then confirmed to have proper coverage and fit.  We then selected the large lag screw and the trunnion was impacted and the lag screw was then inserted with excellent fit and fixation.  We then performed a series of trial reductions and ultimately felt that the 45 x 19 humeral head gave [place] the best soft tissue balance and good coverage good stability.  The trial was then removed.  At this time we placed a series of 3 suture anchors along the medial margin of the lesser tuberosity osteotomy.  We then cleaned the trunnion and impacted our final 45 x 19 head.  A final reduction was then performed.  This showed approximate 50% translation across the glenoid with excellent soft tissue balance good motion good stability.  We then used our medial row anchors to pass suture limbs up through the bone tendon junction equidistant across the width of the subscapularis insertion.  The suture limbs were then tensioned and delivered in an alternating fashion into 2 lateral anchors which were placed into the bicipital groove creating a double row repair allowing excellent reapposition of the lesser tuberosity osteotomy to the donor site on the proximal humerus.  These were all appropriately tensioned and terminally seated  with excellent fixation.  We then repaired the rotator interval using a series of figure-of-eight suture tape sutures.  Upon completion the arm easily achieved 45 degrees of external rotation without excessive tension on the subscap repair.  The wounds and copes irrigated.  Final hemostasis was obtained.  The deltopectoral interval was reapproximated with a series of figure-of-eight and 1 Vicryl sutures.  2-0 Vicryl used for the subcu layer and intracuticular 3 Monocryl for the skin followed by Dermabond and Aquacel dressing.  Left arm is in place into a sling and the patient was awakened, extubated, and taken to the recovery in stable addition.  Jenetta Loges, PA-C was utilized as an Environmental consultant throughout this case, essential for help with positioning the patient, positioning extremity, tissue manipulation, implantation of the prosthesis, suture management, wound closure, and intraoperative decision-making.  Marin Shutter MD   Contact # [phone]

## 2020-02-13 NOTE — Anesthesia Postprocedure Evaluation (Signed)
Anesthesia Post Note  Patient: Rodney Bailey.  Procedure(s) Performed: TOTAL SHOULDER ARTHROPLASTY (Left Shoulder)     Patient location during evaluation: PACU Anesthesia Type: Regional and General Level of consciousness: awake and alert Pain management: pain level controlled Vital Signs Assessment: post-procedure vital signs reviewed and stable Respiratory status: spontaneous breathing, nonlabored ventilation, respiratory function stable and patient connected to nasal cannula oxygen Cardiovascular status: blood pressure returned to baseline and stable Postop Assessment: no apparent nausea or vomiting Anesthetic complications: no   No complications documented.  Last Vitals:  Vitals:   02/13/20 1430 02/13/20 1452  BP: 135/73 (!) 150/79  Pulse: 64 70  Resp: 14 16  Temp:  36.7 C  SpO2: 94% 92%    Last Pain:  Vitals:   02/13/20 1452  TempSrc:   PainSc: 0-No pain                 Signora Zucco L Janeese Mcgloin

## 2020-02-13 NOTE — H&P (Signed)
Rodney Bailey.    Chief Complaint: Left shoulder osteoarthritis HPI: The patient is a 69 y.o. male with chronic and progressively increasing left shoulder pain related to advanced glenohumeral joint osteoarthritis.  Due to his increasing functional imitations and failure to respond to conservative management he is brought to the operating this time for planned left anatomic shoulder arthroplasty  Past Medical History:  Diagnosis Date  . Allergy   . Arthritis     Past Surgical History:  Procedure Laterality Date  . TOTAL HIP ARTHROPLASTY Left 05/29/2019   Procedure: TOTAL HIP ARTHROPLASTY ANTERIOR APPROACH SAME DAY DISCHARGE;  Surgeon: Gaynelle Arabian, MD;  Location: [facility];  Service: Orthopedics;  Laterality: Left;  173min    Family History  Problem Relation Age of Onset  . Heart disease Mother        CHF  . Stroke Father     Social History:  reports that he has never smoked. He has never used smokeless tobacco. He reports that he does not drink alcohol and does not use drugs.   Medications Prior to Admission  Medication Sig Dispense Refill  . acetaminophen (TYLENOL) 500 MG tablet Take 500 mg by mouth every 6 (six) hours as needed for mild pain or moderate pain.    . Ascorbic Acid (VITAMIN C) 1000 MG tablet Take 1,000 mg by mouth daily.    Marland Kitchen atorvastatin (LIPITOR) 10 MG tablet Take 1 tablet (10 mg total) by mouth daily. Can initially start QOD to make sure tolerated. 90 tablet 2  . Benfotiamine 150 MG CAPS Take 150 mg by mouth daily.    . Cholecalciferol (VITAMIN D-3) 125 MCG (5000 UT) TABS Take 5,000 Units by mouth daily.    Marland Kitchen GLUCOSAMINE PO Take 1,500 mg by mouth daily.     Marland Kitchen KRILL OIL PO Take 220 mg by mouth daily.    . Magnesium 400 MG TABS Take 400 mg by mouth 2 (two) times daily.    . Melatonin 5 MG TABS Take 5-10 mg by mouth at bedtime.     Marland Kitchen OVER THE COUNTER MEDICATION Take 280 mg by mouth daily. Horsetail    . SAW PALMETTO, SERENOA REPENS, PO Take 800 mg by mouth  daily.     . traZODone (DESYREL) 100 MG tablet TAKE HALF TO ONE TABLET BY MOUTH AT BEDTIME AS NEEDED FOR SLEEP (Patient taking differently: Take 100 mg by mouth at bedtime. ) 90 tablet 3  . TURMERIC PO Take 1,200 mg by mouth daily.     Marland Kitchen azelastine (ASTELIN) 137 MCG/SPRAY nasal spray Place 1 spray into the nose 2 (two) times daily. Use in each nostril as directed (Patient not taking: Reported on 01/20/2020) 30 mL 6  . HYDROcodone-acetaminophen (NORCO/VICODIN) 5-325 MG tablet Take 1-2 tablets by mouth every 6 (six) hours as needed for severe pain. (Patient not taking: Reported on 01/20/2020) 56 tablet 0  . methocarbamol (ROBAXIN) 500 MG tablet Take 1 tablet (500 mg total) by mouth every 6 (six) hours as needed for muscle spasms. (Patient not taking: Reported on 01/20/2020) 40 tablet 0     Physical Exam: Left shoulder demonstrates painful and restricted motion as noted at his recent office visits.  He demonstrates excellent strength with manual muscle testing.  Plain radiographs confirm complete loss of joint space with subchondral sclerosis and peripheral osteophyte formation.  The humeral head is centered on the glenoid with neutral glenoid version.  Vitals  Temp:  [98.5 F (36.9 C)] 98.5 F (36.9 C) (  09/09 0748) Pulse Rate:  [57-75] 57 (09/09 0944) Resp:  [11-28] 14 (09/09 0944) BP: (104-137)/(67-85) 117/70 (09/09 0944) SpO2:  [96 %-99 %] 99 % (09/09 0944)  Assessment/Plan  Impression: Left shoulder osteoarthritis  Plan of Action: Procedure(s): TOTAL SHOULDER ARTHROPLASTY  Jonelle Bann M Ronny Korff 02/13/2020, 10:31 AM Contact # [phone]

## 2020-02-18 ENCOUNTER — Encounter (HOSPITAL_COMMUNITY): Payer: Self-pay | Admitting: Orthopedic Surgery

## 2020-02-21 DIAGNOSIS — Z471 Aftercare following joint replacement surgery: Secondary | ICD-10-CM | POA: Diagnosis not present

## 2020-02-21 DIAGNOSIS — Z96612 Presence of left artificial shoulder joint: Secondary | ICD-10-CM | POA: Diagnosis not present

## 2020-02-28 IMAGING — CR DG CHEST 2V
2 series · 2 of 2 positions shown · non-contrast
Comparison: None.

CLINICAL DATA: Preoperative clearance for hip replacement.

EXAM:
CHEST - 2 VIEW

[w chest pa]
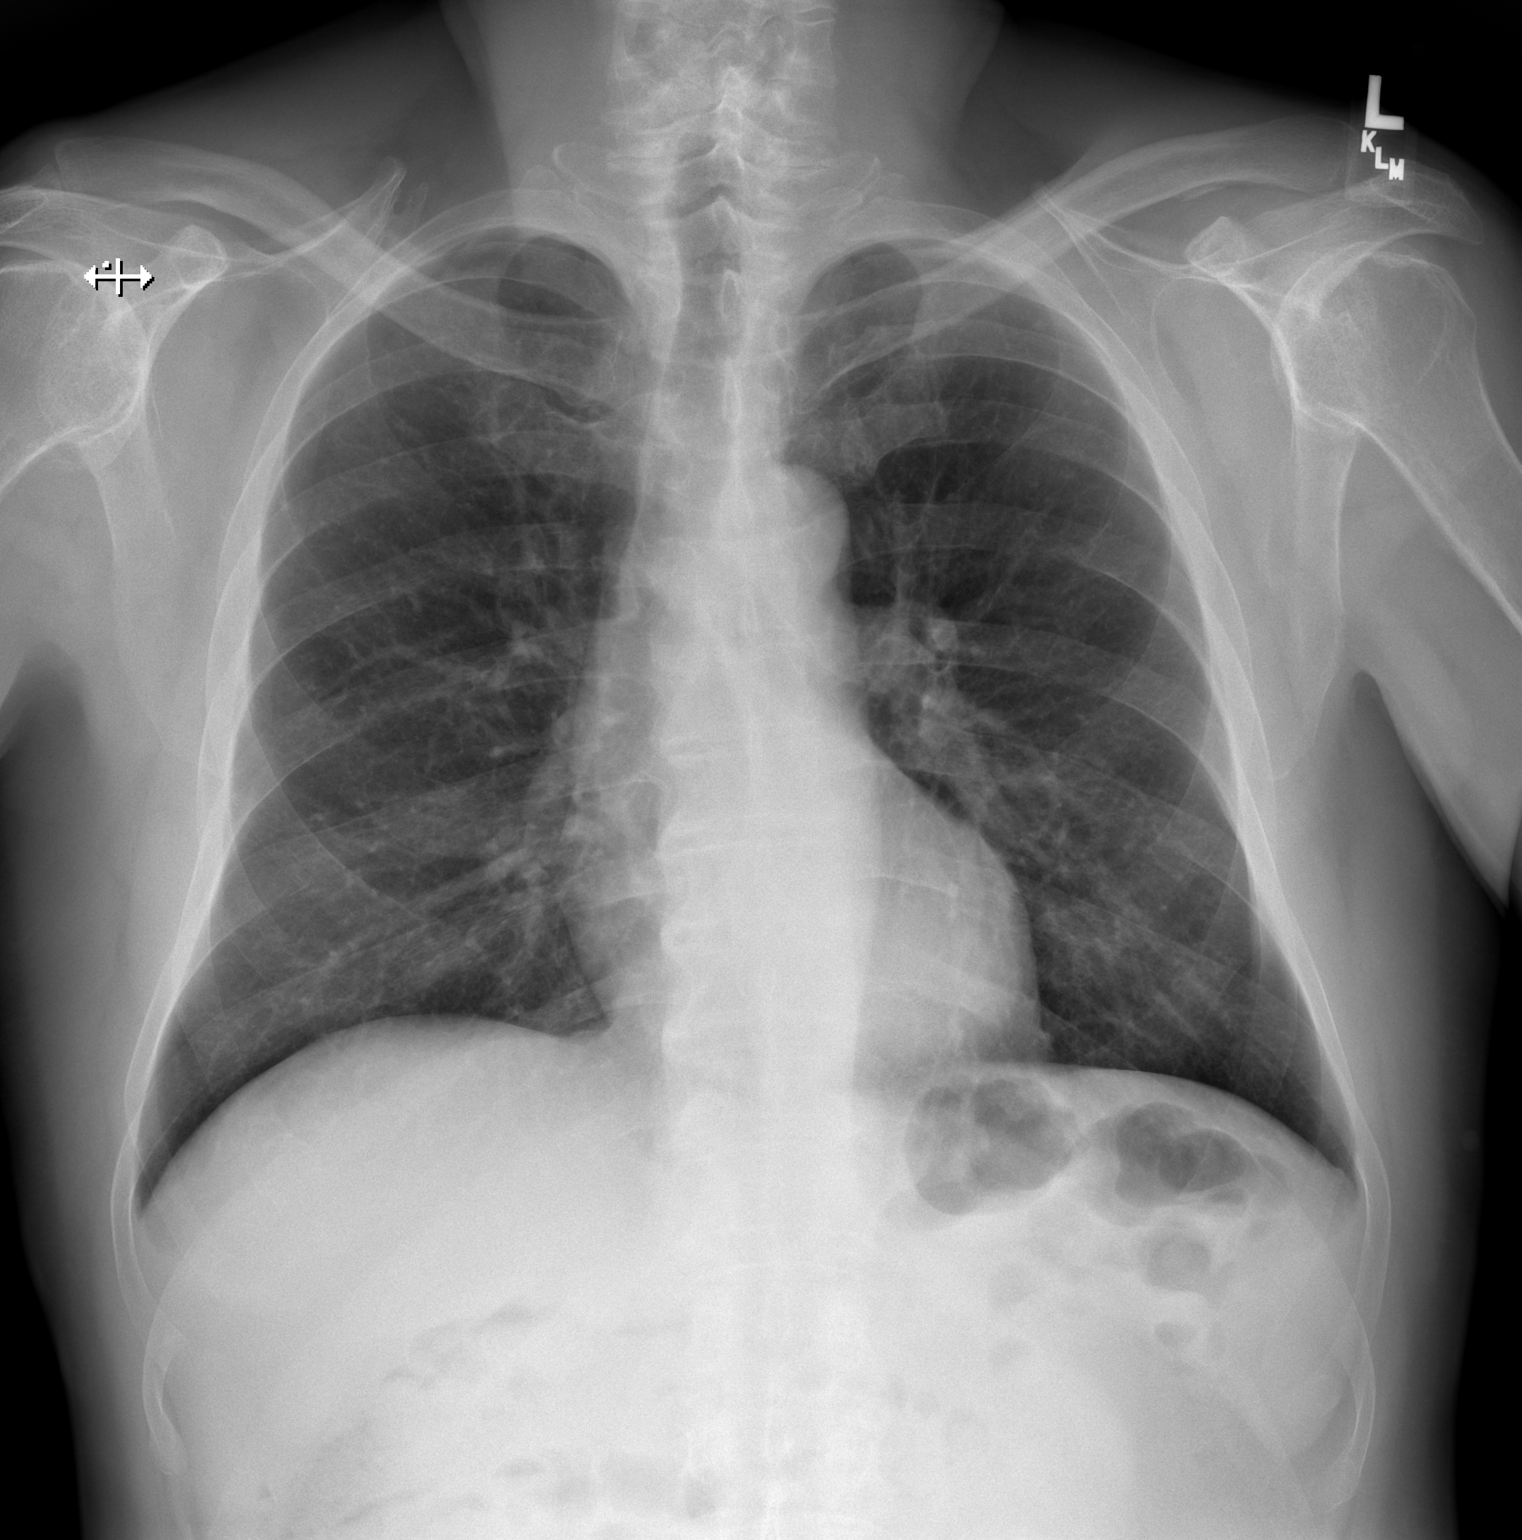

[w chest lat]
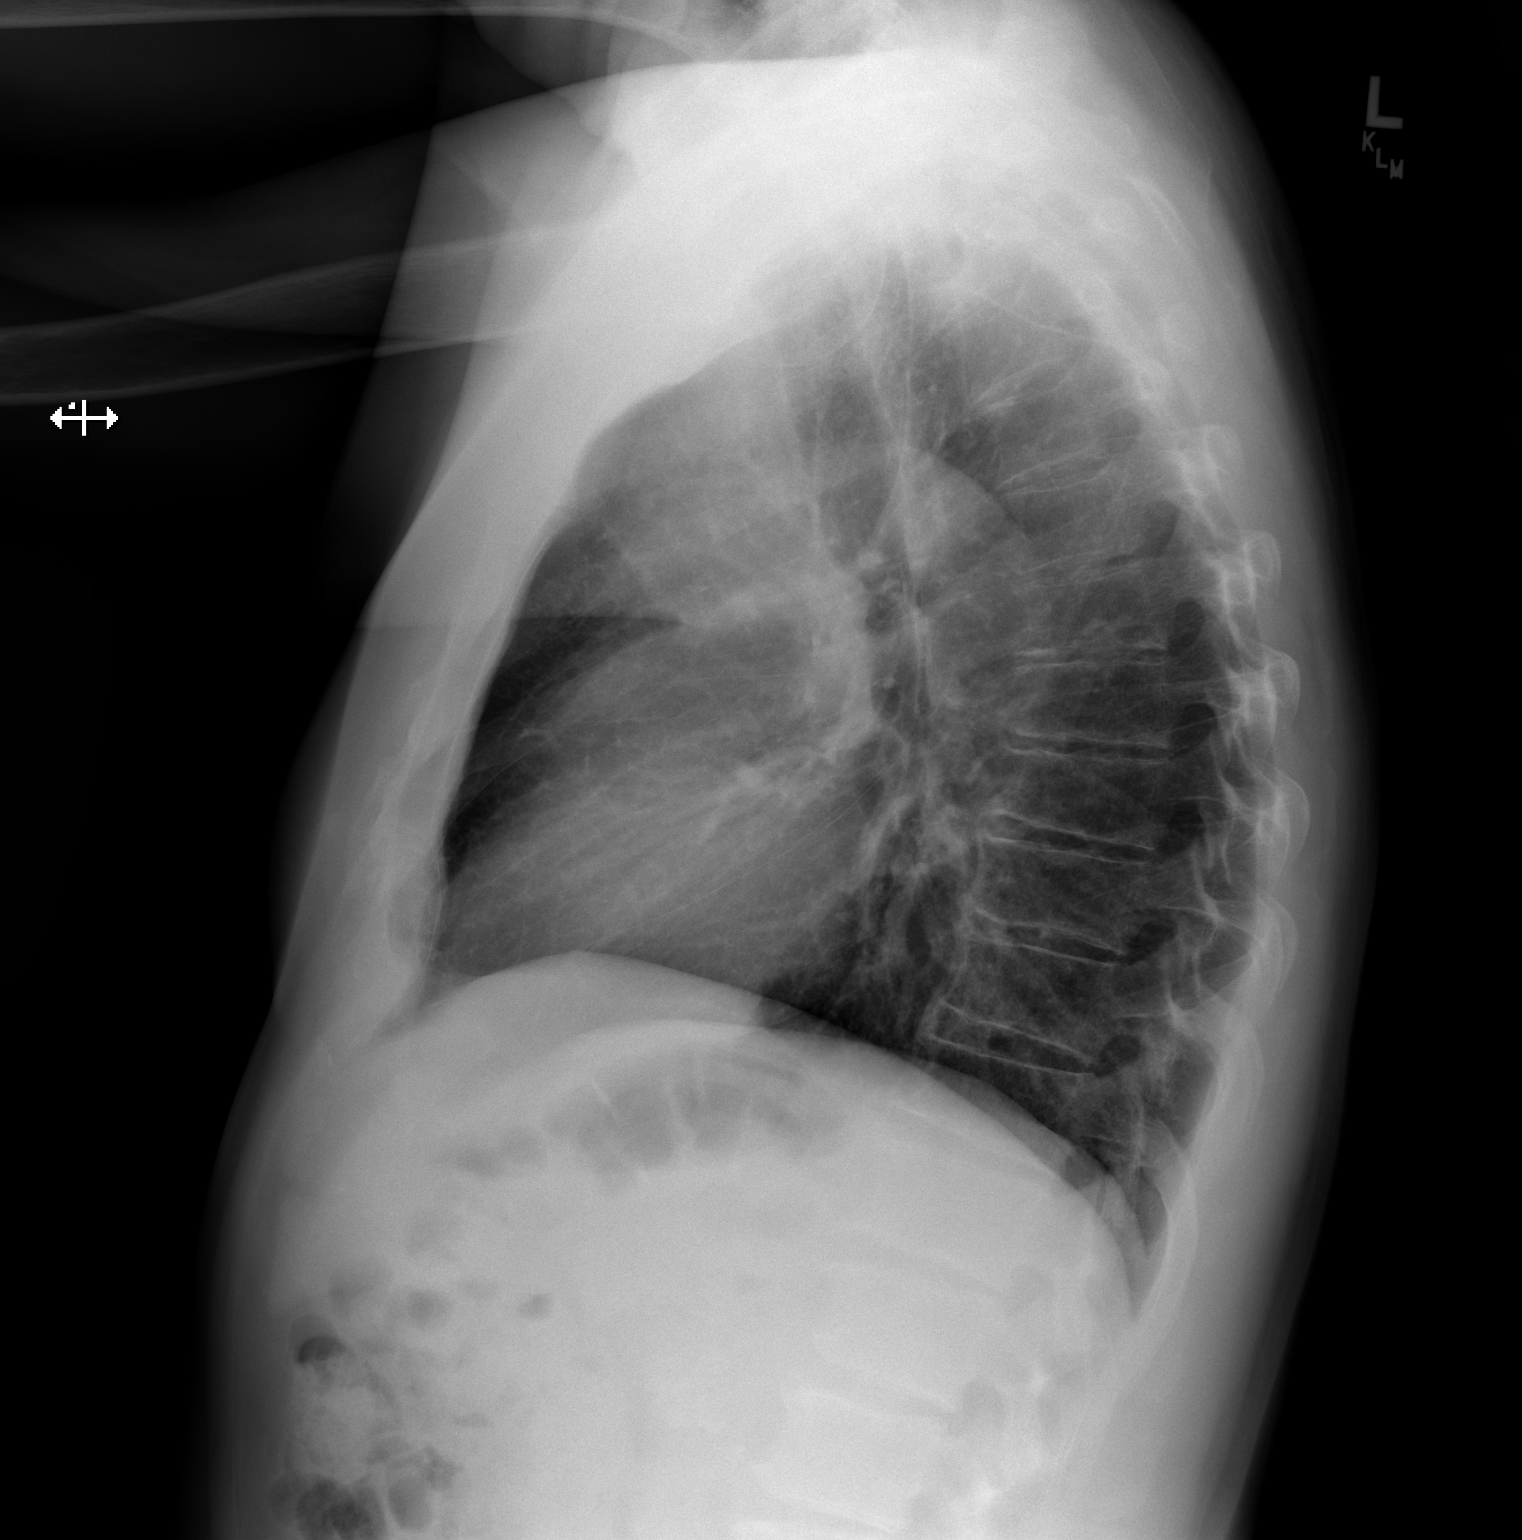

[2 of 2 positions shown; findings below may reference images not displayed]

FINDINGS: Both lungs are clear. Heart and mediastinum are within normal
limits. Trachea is midline. No large pleural effusions. Mild
degenerative changes in the lower thoracic spine.
IMPRESSION: No active cardiopulmonary disease.

## 2020-03-02 DIAGNOSIS — M25612 Stiffness of left shoulder, not elsewhere classified: Secondary | ICD-10-CM | POA: Diagnosis not present

## 2020-03-10 DIAGNOSIS — M25612 Stiffness of left shoulder, not elsewhere classified: Secondary | ICD-10-CM | POA: Diagnosis not present

## 2020-03-13 DIAGNOSIS — M25612 Stiffness of left shoulder, not elsewhere classified: Secondary | ICD-10-CM | POA: Diagnosis not present

## 2020-03-16 DIAGNOSIS — Z96612 Presence of left artificial shoulder joint: Secondary | ICD-10-CM | POA: Diagnosis not present

## 2020-03-16 DIAGNOSIS — Z471 Aftercare following joint replacement surgery: Secondary | ICD-10-CM | POA: Diagnosis not present

## 2020-03-16 DIAGNOSIS — Z4789 Encounter for other orthopedic aftercare: Secondary | ICD-10-CM | POA: Diagnosis not present

## 2020-03-17 DIAGNOSIS — M25612 Stiffness of left shoulder, not elsewhere classified: Secondary | ICD-10-CM | POA: Diagnosis not present

## 2020-03-19 DIAGNOSIS — M25612 Stiffness of left shoulder, not elsewhere classified: Secondary | ICD-10-CM | POA: Diagnosis not present

## 2020-03-24 DIAGNOSIS — M25612 Stiffness of left shoulder, not elsewhere classified: Secondary | ICD-10-CM | POA: Diagnosis not present

## 2020-03-26 DIAGNOSIS — M25612 Stiffness of left shoulder, not elsewhere classified: Secondary | ICD-10-CM | POA: Diagnosis not present

## 2020-03-27 DIAGNOSIS — R69 Illness, unspecified: Secondary | ICD-10-CM | POA: Diagnosis not present

## 2020-03-31 DIAGNOSIS — M25612 Stiffness of left shoulder, not elsewhere classified: Secondary | ICD-10-CM | POA: Diagnosis not present

## 2020-04-02 DIAGNOSIS — M25612 Stiffness of left shoulder, not elsewhere classified: Secondary | ICD-10-CM | POA: Diagnosis not present

## 2020-04-07 DIAGNOSIS — M25612 Stiffness of left shoulder, not elsewhere classified: Secondary | ICD-10-CM | POA: Diagnosis not present

## 2020-04-09 DIAGNOSIS — M25612 Stiffness of left shoulder, not elsewhere classified: Secondary | ICD-10-CM | POA: Diagnosis not present

## 2020-04-14 DIAGNOSIS — M25612 Stiffness of left shoulder, not elsewhere classified: Secondary | ICD-10-CM | POA: Diagnosis not present

## 2020-04-16 DIAGNOSIS — M25612 Stiffness of left shoulder, not elsewhere classified: Secondary | ICD-10-CM | POA: Diagnosis not present

## 2020-04-21 DIAGNOSIS — M25612 Stiffness of left shoulder, not elsewhere classified: Secondary | ICD-10-CM | POA: Diagnosis not present

## 2020-04-23 DIAGNOSIS — M25612 Stiffness of left shoulder, not elsewhere classified: Secondary | ICD-10-CM | POA: Diagnosis not present

## 2020-04-27 DIAGNOSIS — Z471 Aftercare following joint replacement surgery: Secondary | ICD-10-CM | POA: Diagnosis not present

## 2020-04-27 DIAGNOSIS — Z96612 Presence of left artificial shoulder joint: Secondary | ICD-10-CM | POA: Diagnosis not present

## 2020-06-29 ENCOUNTER — Telehealth: Payer: Self-pay | Admitting: Family Medicine

## 2020-06-29 NOTE — Telephone Encounter (Signed)
Amy with [place] insurance is calling to let md know patient insurance will pay for #100 atorvastatin for 90 day supply instead of 90 pills. Pt does not need refills at this time

## 2020-07-15 ENCOUNTER — Ambulatory Visit (INDEPENDENT_AMBULATORY_CARE_PROVIDER_SITE_OTHER): Payer: Medicare HMO | Admitting: Family Medicine

## 2020-07-15 ENCOUNTER — Encounter: Payer: Self-pay | Admitting: Family Medicine

## 2020-07-15 ENCOUNTER — Other Ambulatory Visit: Payer: Self-pay

## 2020-07-15 VITALS — BP 112/78 | HR 61 | Temp 98.8°F | Ht 66.0 in | Wt 171.0 lb

## 2020-07-15 DIAGNOSIS — E785 Hyperlipidemia, unspecified: Secondary | ICD-10-CM

## 2020-07-15 DIAGNOSIS — G4700 Insomnia, unspecified: Secondary | ICD-10-CM | POA: Diagnosis not present

## 2020-07-15 DIAGNOSIS — Z1211 Encounter for screening for malignant neoplasm of colon: Secondary | ICD-10-CM

## 2020-07-15 LAB — COMPREHENSIVE METABOLIC PANEL
ALT: 23 IU/L (ref 0–44)
AST: 24 IU/L (ref 0–40)
Albumin/Globulin Ratio: 2.2 (ref 1.2–2.2)
Albumin: 4.7 g/dL (ref 3.8–4.8)
Alkaline Phosphatase: 67 IU/L (ref 44–121)
BUN/Creatinine Ratio: 14 (ref 10–24)
BUN: 18 mg/dL (ref 8–27)
Bilirubin Total: 2.2 mg/dL — ABNORMAL HIGH (ref 0.0–1.2)
CO2: 23 mmol/L (ref 20–29)
Calcium: 9.8 mg/dL (ref 8.6–10.2)
Chloride: 103 mmol/L (ref 96–106)
Creatinine, Ser: 1.3 mg/dL — ABNORMAL HIGH (ref 0.76–1.27)
GFR calc Af Amer: 64 mL/min/{1.73_m2} (ref >59)
GFR calc non Af Amer: 56 mL/min/{1.73_m2} — ABNORMAL LOW (ref >59)
Globulin, Total: 2.1 g/dL (ref 1.5–4.5)
Glucose: 89 mg/dL (ref 65–99)
Potassium: 4.8 mmol/L (ref 3.5–5.2)
Sodium: 141 mmol/L (ref 134–144)
Total Protein: 6.8 g/dL (ref 6.0–8.5)

## 2020-07-15 LAB — LIPID PANEL
Chol/HDL Ratio: 3.4 ratio (ref 0.0–5.0)
Cholesterol, Total: 171 mg/dL (ref 100–199)
HDL: 51 mg/dL (ref >39)
LDL Chol Calc (NIH): 106 mg/dL — ABNORMAL HIGH (ref 0–99)
Triglycerides: 71 mg/dL (ref 0–149)
VLDL Cholesterol Cal: 14 mg/dL (ref 5–40)

## 2020-07-15 MED ORDER — ATORVASTATIN CALCIUM 10 MG PO TABS: 10.0000 mg | ORAL_TABLET | Freq: Every day | ORAL | 2 refills | Status: DC

## 2020-07-15 MED ORDER — TRAZODONE HCL 100 MG PO TABS: ORAL_TABLET | ORAL | 3 refills | Status: DC

## 2020-07-15 NOTE — Patient Instructions (Addendum)
No change in trazodone for now. If frequent snoring or daytime sleepiness, I would recommend sleep study. Let me know.   Check with your pharmacy. If you have not received the flu shot, get that soon.   No change in meds for now. I will refer you for colonoscopy.   Thanks for coming in today.    If you have lab work done today you will be contacted with your lab results within the next 2 weeks.  If you have not heard from [place] then please contact us. The fastest way to get your results is to register for My Chart.   IF you received an x-ray today, you will receive an invoice from [facility]. Please contact [facility] at [phone] with questions or concerns regarding your invoice.   IF you received labwork today, you will receive an invoice from [facility]. Please contact [facility] at [phone] with questions or concerns regarding your invoice.   Our billing staff will not be able to assist you with questions regarding bills from these companies.  You will be contacted with the lab results as soon as they are available. The fastest way to get your results is to activate your My Chart account. Instructions are located on the last page of this paperwork. If you have not heard from [place] regarding the results in 2 weeks, please contact this office.

## 2020-07-15 NOTE — Progress Notes (Signed)
Subjective:  Patient ID: Rodney Bailey., male    DOB: 02-Aug-1950  Age: 70 y.o. MRN: [id number]  CC:  Chief Complaint  Patient presents with  . Follow-up    On Hyperlipidemia, post-OP, and medication refill. Pt had L shoulder surgery since lat OV. Pt reports improvement since the surgery.     HPI Rodney Rossitto Ammie Ferrier Jr. presents for  Hyperlipidemia: Treated with Lipitor 10 mg daily.  Denies any myalgias/arthralgias. Getting exercise - bike daily. Home PT for left shoulder (total shoulder in September, Dr. Onnie Graham).   Lab Results  Component Value Date   CHOL 185 01/13/2020   HDL 51 01/13/2020   LDLCALC 121 (H) 01/13/2020   TRIG 72 01/13/2020   CHOLHDL 3.6 01/13/2020   Lab Results  Component Value Date   ALT 32 01/13/2020   AST 30 01/13/2020   ALKPHOS 74 01/13/2020   BILITOT 1.6 (H) 01/13/2020   Insomnia: Stable with trazodone 100 mg nightly, feels well rested usually with melatonin. No new side effects/parasomnias. Some snoring at times.  Health maintenance Due for colonoscopy -  Last one in 2012 -  Dr. Collene Mares? referral placed today. Covid vaccine: 2/8, 3/5 - [place], had booster.  Flu vaccine - unknown. Maybe at pharmacy.   History Patient Active Problem List   Diagnosis Date Noted  . OA (osteoarthritis) of hip 05/29/2019  . Other specified arthritis, left hip 05/29/2019  . Need for influenza vaccination 03/28/2017  . Injury of toe on right foot 03/28/2017  . Subungual hematoma of great toe of right foot 03/28/2017  . Insomnia 11/02/2016  . Nocturia 11/03/2015  . Hematuria 04/26/2014   Past Medical History:  Diagnosis Date  . Allergy   . Arthritis    Past Surgical History:  Procedure Laterality Date  . TOTAL HIP ARTHROPLASTY Left 05/29/2019   Procedure: TOTAL HIP ARTHROPLASTY ANTERIOR APPROACH SAME DAY DISCHARGE;  Surgeon: Rodney Arabian, MD;  Location: [facility];  Service: Orthopedics;  Laterality: Left;  146min  . TOTAL SHOULDER ARTHROPLASTY Left 02/13/2020    Procedure: TOTAL SHOULDER ARTHROPLASTY;  Surgeon: Rodney Britain, MD;  Location: [facility];  Service: Orthopedics;  Laterality: Left;  174min   No Known Allergies Prior to Admission medications   Medication Sig Start Date End Date Taking? Authorizing Provider  acetaminophen (TYLENOL) 500 MG tablet Take 500 mg by mouth every 6 (six) hours as needed for mild pain or moderate pain.   Yes [provider]  Ascorbic Acid (VITAMIN C) 1000 MG tablet Take 1,000 mg by mouth daily.   Yes [provider]  atorvastatin (LIPITOR) 10 MG tablet Take 1 tablet (10 mg total) by mouth daily. Can initially start QOD to make sure tolerated. 01/13/20  Yes Wendie Agreste, MD  Benfotiamine 150 MG CAPS Take 150 mg by mouth daily.   Yes [provider]  Cholecalciferol (VITAMIN D-3) 125 MCG (5000 UT) TABS Take 5,000 Units by mouth daily.   Yes [provider]  GLUCOSAMINE PO Take 1,500 mg by mouth daily.    Yes [provider]  KRILL OIL PO Take 220 mg by mouth daily.   Yes [provider]  Magnesium 400 MG TABS Take 400 mg by mouth 2 (two) times daily.   Yes [provider]  Melatonin 5 MG TABS Take 5-10 mg by mouth at bedtime.   Yes [provider]  OVER THE COUNTER MEDICATION Take 280 mg by mouth daily. Horsetail   Yes [provider]  SAW  PALMETTO, SERENOA REPENS, PO Take 800 mg by mouth daily.    Yes [provider]  traZODone (DESYREL) 100 MG tablet TAKE HALF TO ONE TABLET BY MOUTH AT BEDTIME AS NEEDED FOR SLEEP Patient taking differently: Take 100 mg by mouth at bedtime. 11/11/19  Yes Wendie Agreste, MD  TURMERIC PO Take 1,200 mg by mouth daily.    Yes [provider]  cyclobenzaprine (FLEXERIL) 10 MG tablet Take 1 tablet (10 mg total) by mouth 3 (three) times daily as needed for muscle spasms. Patient not taking: Reported on 07/15/2020 02/13/20   [name], [name], PA-C  HYDROcodone-acetaminophen (NORCO/VICODIN) 5-325 MG  tablet Take 1-2 tablets by mouth every 6 (six) hours as needed for severe pain. Patient not taking: Reported on 07/15/2020 02/13/20 02/12/21  [name], [name], PA-C  naproxen (NAPROSYN) 500 MG tablet Take 1 tablet (500 mg total) by mouth 2 (two) times daily with a meal. Patient not taking: Reported on 07/15/2020 02/13/20   [name], [name], PA-C  ondansetron (ZOFRAN) 4 MG tablet Take 1 tablet (4 mg total) by mouth every 8 (eight) hours as needed for nausea or vomiting. Patient not taking: Reported on 07/15/2020 02/13/20   [name], [name], PA-C   Social History   Socioeconomic History  . Marital status: Married    Spouse name: Not on file  . Number of children: Not on file  . Years of education: Not on file  . Highest education level: Not on file  Occupational History  . Not on file  Tobacco Use  . Smoking status: Never Smoker  . Smokeless tobacco: Never Used  Vaping Use  . Vaping Use: Never used  Substance and Sexual Activity  . Alcohol use: No  . Drug use: No  . Sexual activity: Yes    Birth control/protection: None    Comment: SEX PARTNERS IN THE LAST 12 MONTHS 1 AND CURRENT BIRTH CONTROL - NOT NEEDED  Other Topics Concern  . Not on file  Social History Narrative   EXERCISE WALKING AND WEIGHT TRAINING 5-6 DAYS/WEEK FOR 28 MINUTES   His wife died 03-25-17   Social Determinants of Health   Financial Resource Strain: Not on file  Food Insecurity: Not on file  Transportation Needs: Not on file  Physical Activity: Not on file  Stress: Not on file  Social Connections: Not on file  Intimate Partner Violence: Not on file    Review of Systems  Constitutional: Negative for fatigue and unexpected weight change.  Eyes: Negative for visual disturbance.  Respiratory: Negative for cough, chest tightness and shortness of breath.   Cardiovascular: Negative for chest pain, palpitations and leg swelling.  Gastrointestinal: Negative for abdominal pain and blood in stool.  Neurological: Negative for  dizziness, light-headedness and headaches.     Objective:   Vitals:   07/15/20 1043 07/15/20 1100  BP: (!) 147/79 112/78  Pulse: 61   Temp: 98.8 F (37.1 C)   TempSrc: Temporal   SpO2: 97%   Weight: 171 lb (77.6 kg)   Height: 5\' 6"  (1.676 m)      Physical Exam Vitals reviewed.  Constitutional:      Appearance: He is well-developed and well-nourished.  HENT:     Head: Normocephalic and atraumatic.  Eyes:     Extraocular Movements: EOM normal.     Pupils: Pupils are equal, round, and reactive to light.  Neck:     Vascular: No carotid bruit or JVD.  Cardiovascular:     Rate and Rhythm: Normal rate  and regular rhythm.     Heart sounds: Normal heart sounds. No murmur heard.   Pulmonary:     Effort: Pulmonary effort is normal.     Breath sounds: Normal breath sounds. No rales.  Musculoskeletal:        General: No edema.  Skin:    General: Skin is warm and dry.  Neurological:     Mental Status: He is alert and oriented to person, place, and time.  Psychiatric:        Mood and Affect: Mood and affect and mood normal.        Behavior: Behavior normal.     Assessment & Plan:  Axl Rodino. is a 70 y.o. male . Hyperlipidemia, unspecified hyperlipidemia type - Plan: atorvastatin (LIPITOR) 10 MG tablet, Lipid panel, Comprehensive metabolic panel  -Tolerating lipitor, continue same.  Check labs  Insomnia, unspecified type - Plan: traZODone (DESYREL) 100 MG tablet  -Overall well controlled with melatonin, trazodone.  Does report some snoring, recommended sleep study if persistent snoring or daytime somnolence.  Special screening for malignant neoplasms, colon - Plan: Ambulatory referral to Gastroenterology  -Refer for colonoscopy  Meds ordered this encounter  Medications  . atorvastatin (LIPITOR) 10 MG tablet    Sig: Take 1 tablet (10 mg total) by mouth daily. Can initially start QOD to make sure tolerated.    Dispense:  90 tablet    Refill:  2  . traZODone  (DESYREL) 100 MG tablet    Sig: TAKE HALF TO ONE TABLET BY MOUTH AT BEDTIME AS NEEDED FOR SLEEP    Dispense:  90 tablet    Refill:  3   Patient Instructions   No change in trazodone for now. If frequent snoring or daytime sleepiness, I would recommend sleep study. Let me know.   Check with your pharmacy. If you have not received the flu shot, get that soon.   No change in meds for now. I will refer you for colonoscopy.   Thanks for coming in today.    If you have lab work done today you will be contacted with your lab results within the next 2 weeks.  If you have not heard from [place] then please contact us. The fastest way to get your results is to register for My Chart.   IF you received an x-ray today, you will receive an invoice from [facility]. Please contact [facility] at [phone] with questions or concerns regarding your invoice.   IF you received labwork today, you will receive an invoice from [facility]. Please contact [facility] at [phone] with questions or concerns regarding your invoice.   Our billing staff will not be able to assist you with questions regarding bills from these companies.  You will be contacted with the lab results as soon as they are available. The fastest way to get your results is to activate your My Chart account. Instructions are located on the last page of this paperwork. If you have not heard from [place] regarding the results in 2 weeks, please contact this office.         Signed, Merri Ray, MD [facility]

## 2020-08-14 DIAGNOSIS — Z96612 Presence of left artificial shoulder joint: Secondary | ICD-10-CM | POA: Diagnosis not present

## 2020-09-05 DIAGNOSIS — Z823 Family history of stroke: Secondary | ICD-10-CM | POA: Diagnosis not present

## 2020-09-05 DIAGNOSIS — Z803 Family history of malignant neoplasm of breast: Secondary | ICD-10-CM | POA: Diagnosis not present

## 2020-09-05 DIAGNOSIS — E785 Hyperlipidemia, unspecified: Secondary | ICD-10-CM | POA: Diagnosis not present

## 2020-09-05 DIAGNOSIS — Z833 Family history of diabetes mellitus: Secondary | ICD-10-CM | POA: Diagnosis not present

## 2020-09-05 DIAGNOSIS — R030 Elevated blood-pressure reading, without diagnosis of hypertension: Secondary | ICD-10-CM | POA: Diagnosis not present

## 2020-09-05 DIAGNOSIS — G4700 Insomnia, unspecified: Secondary | ICD-10-CM | POA: Diagnosis not present

## 2020-09-18 DIAGNOSIS — R0981 Nasal congestion: Secondary | ICD-10-CM | POA: Diagnosis not present

## 2020-09-18 DIAGNOSIS — J019 Acute sinusitis, unspecified: Secondary | ICD-10-CM | POA: Diagnosis not present

## 2020-09-18 DIAGNOSIS — R509 Fever, unspecified: Secondary | ICD-10-CM | POA: Diagnosis not present

## 2020-10-29 DIAGNOSIS — Z1211 Encounter for screening for malignant neoplasm of colon: Secondary | ICD-10-CM | POA: Diagnosis not present

## 2020-11-06 IMAGING — DX DG CHEST 2V
2 series · 2 of 2 positions shown · non-contrast
Comparison: 05/06/2019

CLINICAL DATA: Preop shoulder surgery.

EXAM:
CHEST - 2 VIEW

[chest pa]
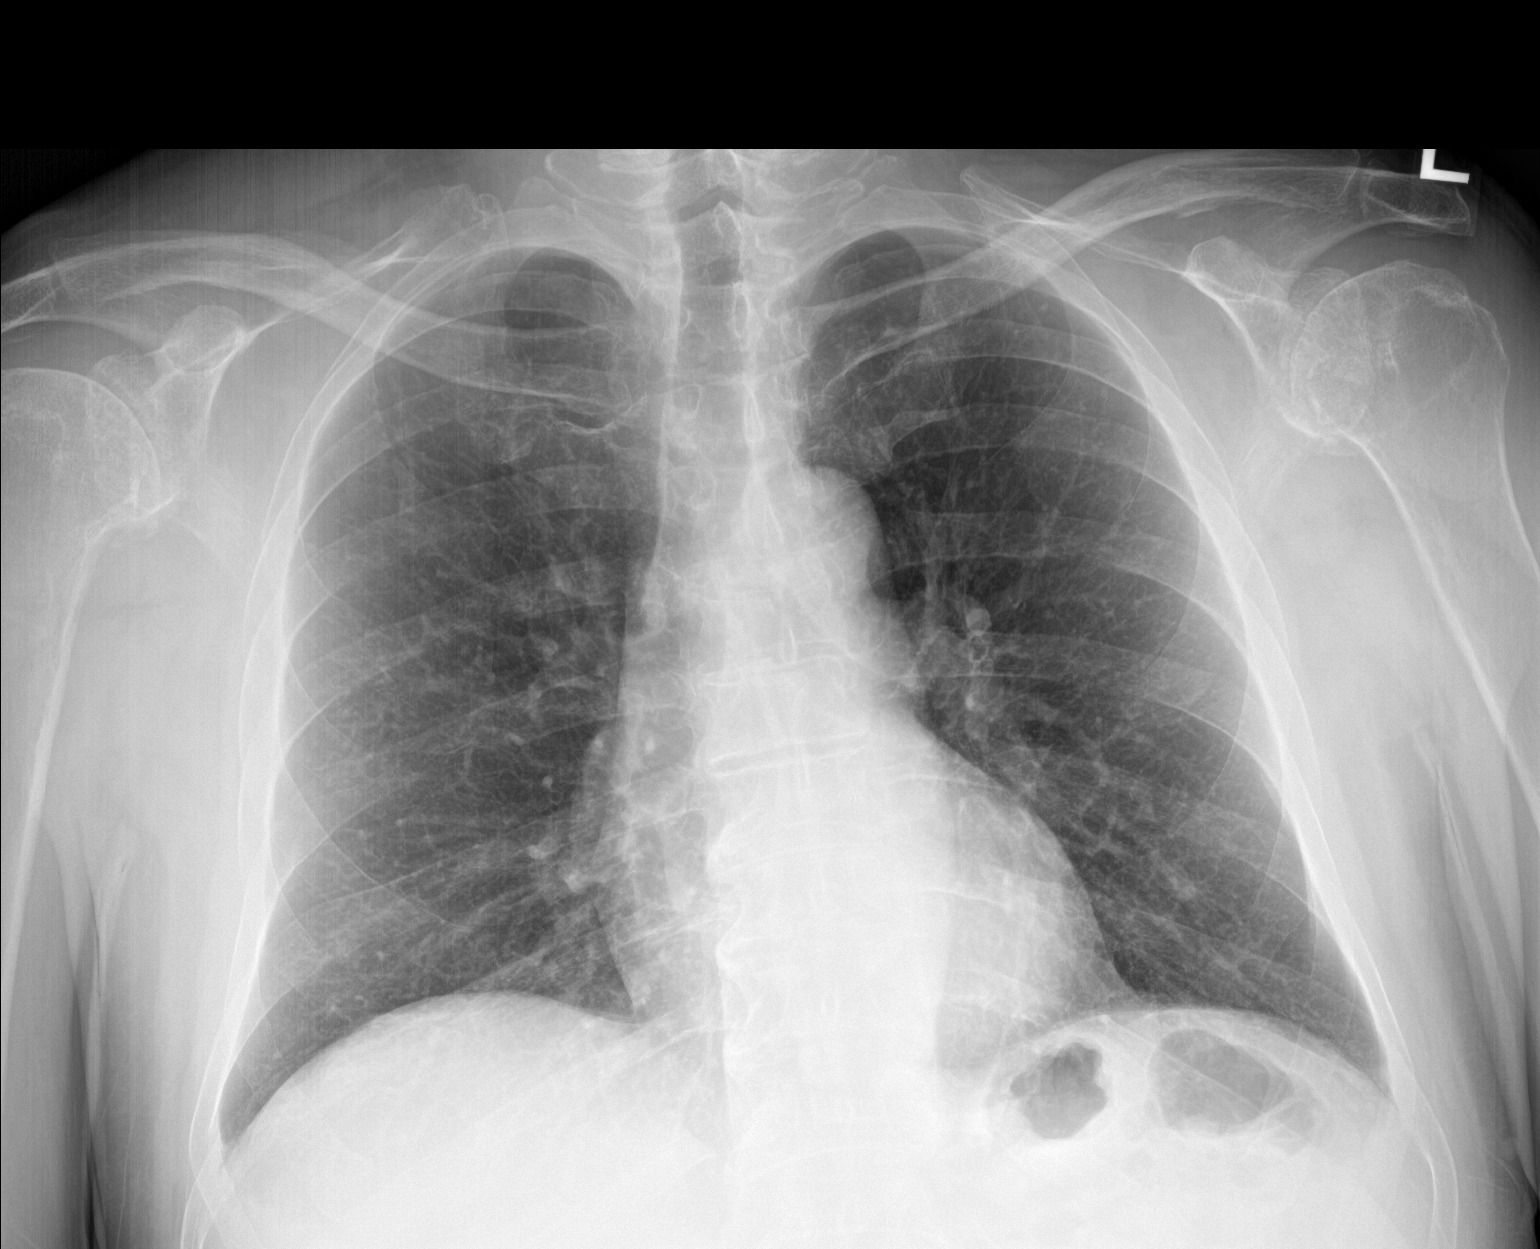

[chest lat]
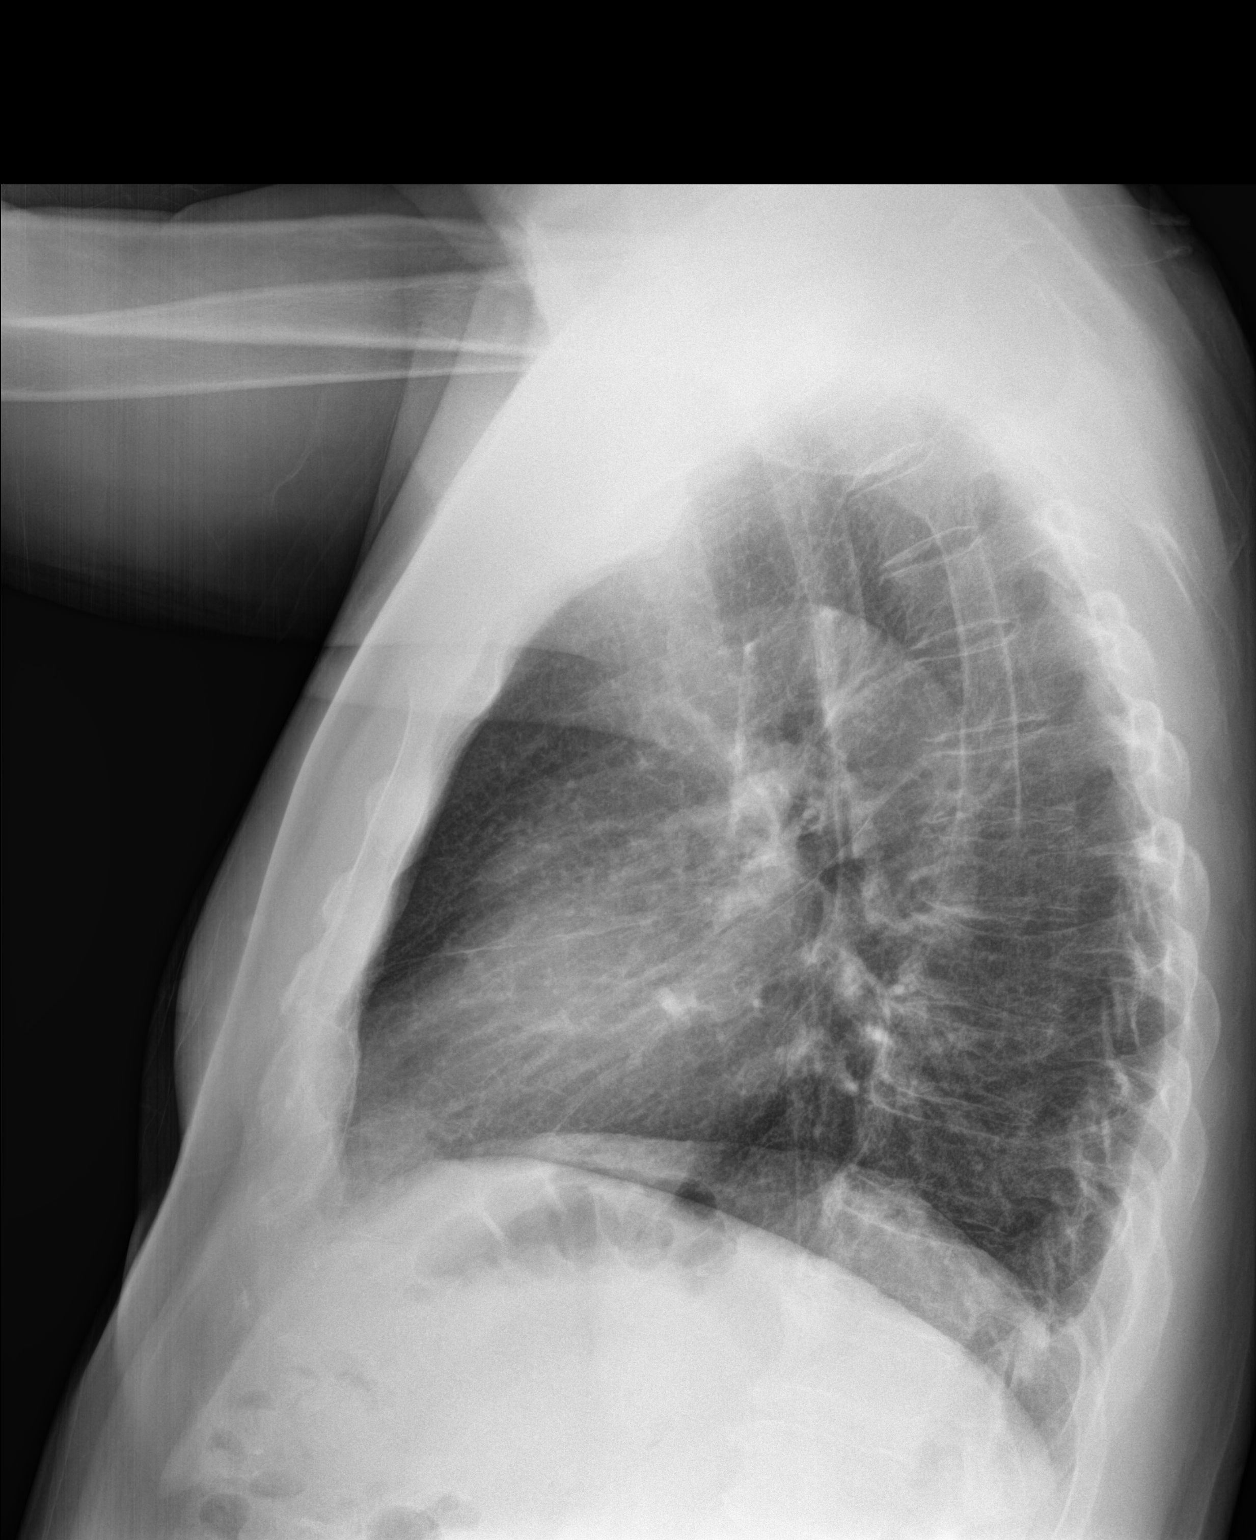

[2 of 2 positions shown; findings below may reference images not displayed]

FINDINGS: The heart size and mediastinal contours are within normal limits.
Both lungs are clear. The visualized skeletal structures are
unremarkable.
IMPRESSION: No active cardiopulmonary disease.

## 2021-01-18 ENCOUNTER — Ambulatory Visit (INDEPENDENT_AMBULATORY_CARE_PROVIDER_SITE_OTHER): Payer: Medicare HMO | Admitting: Family Medicine

## 2021-01-18 ENCOUNTER — Encounter: Payer: Self-pay | Admitting: Family Medicine

## 2021-01-18 ENCOUNTER — Other Ambulatory Visit: Payer: Self-pay

## 2021-01-18 VITALS — BP 110/80 | HR 64 | Temp 98.2°F | Resp 17 | Ht 66.0 in | Wt 166.6 lb

## 2021-01-18 DIAGNOSIS — Z0000 Encounter for general adult medical examination without abnormal findings: Secondary | ICD-10-CM | POA: Diagnosis not present

## 2021-01-18 DIAGNOSIS — G4700 Insomnia, unspecified: Secondary | ICD-10-CM

## 2021-01-18 DIAGNOSIS — Z131 Encounter for screening for diabetes mellitus: Secondary | ICD-10-CM

## 2021-01-18 DIAGNOSIS — Z125 Encounter for screening for malignant neoplasm of prostate: Secondary | ICD-10-CM | POA: Diagnosis not present

## 2021-01-18 DIAGNOSIS — Z13220 Encounter for screening for lipoid disorders: Secondary | ICD-10-CM

## 2021-01-18 DIAGNOSIS — E785 Hyperlipidemia, unspecified: Secondary | ICD-10-CM

## 2021-01-18 LAB — COMPREHENSIVE METABOLIC PANEL
ALT: 26 U/L (ref 0–53)
AST: 23 U/L (ref 0–37)
Albumin: 4.5 g/dL (ref 3.5–5.2)
Alkaline Phosphatase: 64 U/L (ref 39–117)
BUN: 25 mg/dL — ABNORMAL HIGH (ref 6–23)
CO2: 28 mEq/L (ref 19–32)
Calcium: 9.9 mg/dL (ref 8.4–10.5)
Chloride: 104 mEq/L (ref 96–112)
Creatinine, Ser: 1.19 mg/dL (ref 0.40–1.50)
GFR: 62.31 mL/min (ref >60.00)
Glucose, Bld: 91 mg/dL (ref 70–99)
Potassium: 4.8 mEq/L (ref 3.5–5.1)
Sodium: 140 mEq/L (ref 135–145)
Total Bilirubin: 2 mg/dL — ABNORMAL HIGH (ref 0.2–1.2)
Total Protein: 6.6 g/dL (ref 6.0–8.3)

## 2021-01-18 LAB — LIPID PANEL
Cholesterol: 176 mg/dL (ref 0–200)
HDL: 46.7 mg/dL (ref >39.00)
LDL Cholesterol: 111 mg/dL — ABNORMAL HIGH (ref 0–99)
NonHDL: 128.8
Total CHOL/HDL Ratio: 4
Triglycerides: 91 mg/dL (ref 0.0–149.0)
VLDL: 18.2 mg/dL (ref 0.0–40.0)

## 2021-01-18 LAB — HEMOGLOBIN A1C: Hgb A1c MFr Bld: 5.6 % (ref 4.6–6.5)

## 2021-01-18 LAB — PSA: PSA: 8.38 ng/mL — ABNORMAL HIGH (ref 0.10–4.00)

## 2021-01-18 MED ORDER — TRAZODONE HCL 100 MG PO TABS: ORAL_TABLET | ORAL | 3 refills | Status: DC

## 2021-01-18 MED ORDER — ATORVASTATIN CALCIUM 10 MG PO TABS: 10.0000 mg | ORAL_TABLET | Freq: Every day | ORAL | 2 refills | Status: DC

## 2021-01-18 NOTE — Patient Instructions (Signed)
Thanks for coming in today. No med changes. Follow up in 6 months for med review, but let me know if questions sooner.   Preventive Care 69 Years and Older, Male Preventive care refers to lifestyle choices and visits with your health care provider that can promote health and wellness. This includes: A yearly physical exam. This is also called an annual wellness visit. Regular dental and eye exams. Immunizations. Screening for certain conditions. Healthy lifestyle choices, such as: Eating a healthy diet. Getting regular exercise. Not using drugs or products that contain nicotine and tobacco. Limiting alcohol use. What can I expect for my preventive care visit? Physical exam Your health care provider will check your: Height and weight. These may be used to calculate your BMI (body mass index). BMI is a measurement that tells if you are at a healthy weight. Heart rate and blood pressure. Body temperature. Skin for abnormal spots. Counseling Your health care provider may ask you questions about your: Past medical problems. Family's medical history. Alcohol, tobacco, and drug use. Emotional well-being. Home life and relationship well-being. Sexual activity. Diet, exercise, and sleep habits. History of falls. Memory and ability to understand (cognition). Work and work Statistician. Access to firearms. What immunizations do I need?  Vaccines are usually given at various ages, according to a schedule. Your health care provider will recommend vaccines for you based on your age, medicalhistory, and lifestyle or other factors, such as travel or where you work. What tests do I need? Blood tests Lipid and cholesterol levels. These may be checked every 5 years, or more often depending on your overall health. Hepatitis C test. Hepatitis B test. Screening Lung cancer screening. You may have this screening every year starting at age 79 if you have a 30-pack-year history of smoking and  currently smoke or have quit within the past 15 years. Colorectal cancer screening. All adults should have this screening starting at age 76 and continuing until age 107. Your health care provider may recommend screening at age 29 if you are at increased risk. You will have tests every 1-10 years, depending on your results and the type of screening test. Prostate cancer screening. Recommendations will vary depending on your family history and other risks. Genital exam to check for testicular cancer or hernias. Diabetes screening. This is done by checking your blood sugar (glucose) after you have not eaten for a while (fasting). You may have this done every 1-3 years. Abdominal aortic aneurysm (AAA) screening. You may need this if you are a current or former smoker. STD (sexually transmitted disease) testing, if you are at risk. Follow these instructions at home: Eating and drinking  Eat a diet that includes fresh fruits and vegetables, whole grains, lean protein, and low-fat dairy products. Limit your intake of foods with high amounts of sugar, saturated fats, and salt. Take vitamin and mineral supplements as recommended by your health care provider. Do not drink alcohol if your health care provider tells you not to drink. If you drink alcohol: Limit how much you have to 0-2 drinks a day. Be aware of how much alcohol is in your drink. In the [place] one drink equals one 12 oz bottle of beer (355 mL), one 5 oz glass of wine (148 mL), or one 1 oz glass of hard liquor (44 mL).  Lifestyle Take daily care of your teeth and gums. Brush your teeth every morning and night with fluoride toothpaste. Floss one time each day. Stay active. Exercise for at least  30 minutes 5 or more days each week. Do not use any products that contain nicotine or tobacco, such as cigarettes, e-cigarettes, and chewing tobacco. If you need help quitting, ask your health care provider. Do not use drugs. If you are sexually  active, practice safe sex. Use a condom or other form of protection to prevent STIs (sexually transmitted infections). Talk with your health care provider about taking a low-dose aspirin or statin. Find healthy ways to cope with stress, such as: Meditation, yoga, or listening to music. Journaling. Talking to a trusted person. Spending time with friends and family. Safety Always wear your seat belt while driving or riding in a vehicle. Do not drive: If you have been drinking alcohol. Do not ride with someone who has been drinking. When you are tired or distracted. While texting. Wear a helmet and other protective equipment during sports activities. If you have firearms in your house, make sure you follow all gun safety procedures. What's next? Visit your health care provider once a year for an annual wellness visit. Ask your health care provider how often you should have your eyes and teeth checked. Stay up to date on all vaccines. This information is not intended to replace advice given to you by your health care provider. Make sure you discuss any questions you have with your healthcare provider. Document Revised: 02/19/2019 Document Reviewed: 05/17/2018 Elsevier Patient Education  2022 [employer].

## 2021-01-18 NOTE — Progress Notes (Signed)
Subjective:  Patient ID: Rodney Bailey., male    DOB: Oct 09, 1950  Age: 70 y.o. MRN: [id number]  CC:  Chief Complaint  Patient presents with   Annual Exam    HPI Taja Gabel [place]. presents for   Presents for annual wellness exam.   Care team: PCP: me Dermatology, Dr. Denna Haggard Gastroenterology, Dr. Rico Sheehan, Dr. Onnie Graham -shoulder surgery in September 2021 Chiropractor: Dr. Karie Schwalbe  Insomnia Trazodone 100 mg nightly along with melatonin. Working ok. Full night sleep most of the time.   Hyperlipidemia: Lipitor 10 mg daily.  No new side effects/myalgias.  Lab Results  Component Value Date   CHOL 171 07/15/2020   HDL 51 07/15/2020   LDLCALC 106 (H) 07/15/2020   TRIG 71 07/15/2020   CHOLHDL 3.4 07/15/2020   Lab Results  Component Value Date   ALT 23 07/15/2020   AST 24 07/15/2020   ALKPHOS 67 07/15/2020   BILITOT 2.2 (H) 07/15/2020    Fall screening Fall Risk  01/18/2021 07/15/2020 01/13/2020 11/11/2019 05/03/2019  Falls in the past year? 0 0 0 0 0  Number falls in past yr: 0 - - - 0  Injury with Fall? 0 - - - 0  Risk for fall due to : No Fall Risks - - - -  Follow up - Falls evaluation completed Falls evaluation completed Falls evaluation completed Falls evaluation completed   Lighting in home: adequate Loose rugs/carpets/pets: small dog usually avoids feet.  Stairs: none Grab bars in bathroom: yes with new shower Timed up and go: 9 seconds, normal gait, no instability.   Depression Screening: Depression screen [facility] 2/9 01/18/2021 07/15/2020 01/13/2020 11/11/2019 05/03/2019  Decreased Interest 0 0 0 0 0  Down, Depressed, Hopeless 0 0 0 0 0  PHQ - 2 Score 0 0 0 0 0  Altered sleeping 1 - - - -  Tired, decreased energy 0 - - - -  Change in appetite 0 - - - -  Feeling bad or failure about yourself  0 - - - -  Trouble concentrating 0 - - - -  Moving slowly or fidgety/restless 0 - - - -  Suicidal thoughts 0 - - - -  PHQ-9 Score 1 - - - -  Difficult doing  work/chores Not difficult at all - - - -    Cancer Screening: Colonoscopy, planned late October of this year, Dr. Collene Mares No FH of prostate CA.  Lab Results  Component Value Date   PSA1 2.7 01/18/2019   PSA1 2.6 12/05/2017   PSA 1.60 11/03/2015   PSA 1.47 03/11/2014   PSA 2.14 05/07/2012  The natural history of prostate cancer and ongoing controversy regarding screening and potential treatment outcomes of prostate cancer has been discussed with the patient. The meaning of a false positive PSA and a false negative PSA has been discussed. He indicates understanding of the limitations of this screening test and wishes to proceed with screening PSA testing.  Immunization History  Administered Date(s) Administered   Fluad Quad(high Dose 65+) 01/19/2019   Influenza, High Dose Seasonal PF 02/27/2018   Influenza,inj,Quad PF,6+ Mos 03/28/2017   Influenza,inj,quad, With Preservative 03/07/2019   Influenza-Unspecified 02/27/2018   PFIZER(Purple Top)SARS-COV-2 Vaccination 07/15/2019, 08/09/2019, 03/27/2020   Pneumococcal Conjugate-13 03/10/2017   Pneumococcal Polysaccharide-23 11/11/2019   Tdap 03/19/2013   Zoster Recombinat (Shingrix) 01/19/2019, 04/08/2019  Covid booster x1 - 2nd booster recommended.   Functional Status Survey: Is the patient deaf or have difficulty hearing?: No Does the  patient have difficulty seeing, even when wearing glasses/contacts?: No Does the patient have difficulty concentrating, remembering, or making decisions?: No Does the patient have difficulty walking or climbing stairs?: No Does the patient have difficulty dressing or bathing?: No Does the patient have difficulty doing errands alone such as visiting a doctor's office or shopping?: No  Memory Screen: 6CIT Screen 01/18/2021 01/18/2021 01/18/2019 12/05/2017  What Year? 0 points 0 points 0 points 0 points  What month? 0 points 0 points 0 points 0 points  What time? 0 points 0 points 0 points 0 points  Count back  from 20 0 points 0 points 0 points 0 points  Months in reverse 0 points 0 points 0 points 0 points  Repeat phrase 2 points 4 points 0 points 0 points  Total Score 2 4 0 0    Alcohol Screening: [place] Visit from 01/18/2021 in [place]  AUDIT-C Score 0       Tobacco: none  Vision Screening   Right eye Left eye Both eyes  Without correction '20/20 20/20 20/20 '$  With correction      Optho/optometry: due for visit - few yrs. Plans to schedule.   Dental: Every 6 months.   Exercise: Weights 2x/week, stationary bike 27 minutes 5 d/week and walking 5k steps or more.   Advanced Directives:  Has living will and hcpoa - no changes requested.   History Patient Active Problem List   Diagnosis Date Noted   Osteoarthritis of left glenohumeral joint 10/03/2019   OA (osteoarthritis) of hip 05/29/2019   Other specified arthritis, left hip 05/29/2019   Pain of left hip joint 02/22/2019   Need for influenza vaccination 03/28/2017   Injury of toe on right foot 03/28/2017   Subungual hematoma of great toe of right foot 03/28/2017   Insomnia 11/02/2016   Nocturia 11/03/2015   Hematuria 04/26/2014   Past Medical History:  Diagnosis Date   Allergy    Arthritis    Past Surgical History:  Procedure Laterality Date   TOTAL HIP ARTHROPLASTY Left 05/29/2019   Procedure: TOTAL HIP ARTHROPLASTY ANTERIOR APPROACH SAME DAY DISCHARGE;  Surgeon: Gaynelle Arabian, MD;  Location: [facility];  Service: Orthopedics;  Laterality: Left;  130mn   TOTAL SHOULDER ARTHROPLASTY Left 02/13/2020   Procedure: TOTAL SHOULDER ARTHROPLASTY;  Surgeon: SJustice Britain MD;  Location: [facility];  Service: Orthopedics;  Laterality: Left;  1212m   No Known Allergies Prior to Admission medications   Medication Sig Start Date End Date Taking? Authorizing Provider  acetaminophen (TYLENOL) 500 MG tablet Take 500 mg by mouth every 6 (six) hours as needed for mild pain or  moderate pain.   Yes [provider]  Ascorbic Acid (VITAMIN C) 1000 MG tablet Take 1,000 mg by mouth daily.   Yes [provider]  atorvastatin (LIPITOR) 10 MG tablet Take 1 tablet (10 mg total) by mouth daily. Can initially start QOD to make sure tolerated. 07/15/20  Yes GrWendie AgresteMD  Benfotiamine 150 MG CAPS Take 150 mg by mouth daily.   Yes [provider]  Cholecalciferol (VITAMIN D-3) 125 MCG (5000 UT) TABS Take 5,000 Units by mouth daily.   Yes [provider]  GLUCOSAMINE PO Take 1,500 mg by mouth daily.    Yes [provider]  KRILL OIL PO Take 220 mg by mouth daily.   Yes [provider]  Magnesium 400 MG TABS Take 400 mg by mouth 2 (two) times daily.  Yes [provider]  Melatonin 5 MG TABS Take 5-10 mg by mouth at bedtime.   Yes [provider]  OVER THE COUNTER MEDICATION Take 280 mg by mouth daily. Horsetail   Yes [provider]  SAW PALMETTO, SERENOA REPENS, PO Take 800 mg by mouth daily.    Yes [provider]  traZODone (DESYREL) 100 MG tablet TAKE HALF TO ONE TABLET BY MOUTH AT BEDTIME AS NEEDED FOR SLEEP 07/15/20  Yes Wendie Agreste, MD  TURMERIC PO Take 1,200 mg by mouth daily.    Yes [provider]  cyclobenzaprine (FLEXERIL) 10 MG tablet Take 1 tablet (10 mg total) by mouth 3 (three) times daily as needed for muscle spasms. Patient not taking: No sig reported 02/13/20   [name], [name], PA-C  HYDROcodone-acetaminophen (NORCO/VICODIN) 5-325 MG tablet Take 1-2 tablets by mouth every 6 (six) hours as needed for severe pain. Patient not taking: No sig reported 02/13/20 02/12/21  [name], [name], PA-C  naproxen (NAPROSYN) 500 MG tablet Take 1 tablet (500 mg total) by mouth 2 (two) times daily with a meal. Patient not taking: No sig reported 02/13/20   [name], [name], PA-C  ondansetron (ZOFRAN) 4 MG tablet Take 1 tablet (4 mg total) by mouth every 8 (eight) hours as needed for  nausea or vomiting. Patient not taking: No sig reported 02/13/20   [name], [name], PA-C   Social History   Socioeconomic History   Marital status: Married    Spouse name: Not on file   Number of children: Not on file   Years of education: Not on file   Highest education level: Not on file  Occupational History   Not on file  Tobacco Use   Smoking status: Never   Smokeless tobacco: Never  Vaping Use   Vaping Use: Never used  Substance and Sexual Activity   Alcohol use: No   Drug use: No   Sexual activity: Yes    Birth control/protection: None    Comment: SEX PARTNERS IN THE LAST 12 MONTHS 1 AND CURRENT BIRTH CONTROL - NOT NEEDED  Other Topics Concern   Not on file  Social History Narrative   EXERCISE WALKING AND WEIGHT TRAINING 5-6 DAYS/WEEK FOR 30 MINUTES   His wife died 2017-03-18   Social Determinants of Health   Financial Resource Strain: Not on file  Food Insecurity: Not on file  Transportation Needs: Not on file  Physical Activity: Not on file  Stress: Not on file  Social Connections: Not on file  Intimate Partner Violence: Not on file    Review of Systems 13 point review of systems per patient health survey noted.  Negative other than as indicated above or in HPI.    Objective:   Vitals:   01/18/21 0802  BP: 110/80  Pulse: 64  Resp: 17  Temp: 98.2 F (36.8 C)  TempSrc: Temporal  SpO2: 98%  Weight: 166 lb 9.6 oz (75.6 kg)  Height: '5\' 6"'$  (1.676 m)     Physical Exam Vitals reviewed.  Constitutional:      Appearance: He is well-developed.  HENT:     Head: Normocephalic and atraumatic.     Right Ear: External ear normal.     Left Ear: External ear normal.  Eyes:     Conjunctiva/sclera: Conjunctivae normal.     Pupils: Pupils are equal, round, and reactive to light.  Neck:     Thyroid: No thyromegaly.     Vascular: No carotid bruit or JVD.  Cardiovascular:     Rate and Rhythm: Normal rate and regular rhythm.     Heart sounds: Normal heart  sounds. No murmur heard. Pulmonary:     Effort: Pulmonary effort is normal. No respiratory distress.     Breath sounds: Normal breath sounds. No wheezing or rales.  Abdominal:     General: There is no distension.     Palpations: Abdomen is soft.     Tenderness: There is no abdominal tenderness.  Musculoskeletal:        General: No tenderness. Normal range of motion.     Cervical back: Normal range of motion and neck supple.     Right lower leg: No edema.     Left lower leg: No edema.  Lymphadenopathy:     Cervical: No cervical adenopathy.  Skin:    General: Skin is warm and dry.  Neurological:     Mental Status: He is alert and oriented to person, place, and time.     Deep Tendon Reflexes: Reflexes are normal and symmetric.  Psychiatric:        Mood and Affect: Mood normal.        Behavior: Behavior normal.       Assessment & Plan:  Aideen Mediate. is a 70 y.o. male . Medicare annual wellness visit, subsequent  - - anticipatory guidance as below in AVS, screening labs if needed. Health maintenance items as above in HPI discussed/recommended as applicable.  - no concerning responses on depression, fall, or functional status screening. Any positive responses noted as above. Advanced directives discussed as in CH[facility].   Hyperlipidemia, unspecified hyperlipidemia type - Plan: atorvastatin (LIPITOR) 10 MG tablet, Comprehensive metabolic panel  -  Stable, tolerating current regimen. Medications refilled. Labs pending as above.   Insomnia, unspecified type - Plan: traZODone (DESYREL) 100 MG tablet  -  Stable, tolerating current regimen. Medications refilled.   Screening for diabetes mellitus - Plan: Comprehensive metabolic panel, Hemoglobin A1c  Screening for prostate cancer - Plan: PSA   Meds ordered this encounter  Medications   atorvastatin (LIPITOR) 10 MG tablet    Sig: Take 1 tablet (10 mg total) by mouth daily.    Dispense:  90 tablet    Refill:  2   traZODone  (DESYREL) 100 MG tablet    Sig: TAKE HALF TO ONE TABLET BY MOUTH AT BEDTIME AS NEEDED FOR SLEEP    Dispense:  90 tablet    Refill:  3    Patient Instructions  Thanks for coming in today. No med changes. Follow up in 6 months for med review, but let me know if questions sooner.   Preventive Care 32 Years and Older, Male Preventive care refers to lifestyle choices and visits with your health care provider that can promote health and wellness. This includes: A yearly physical exam. This is also called an annual wellness visit. Regular dental and eye exams. Immunizations. Screening for certain conditions. Healthy lifestyle choices, such as: Eating a healthy diet. Getting regular exercise. Not using drugs or products that contain nicotine and tobacco. Limiting alcohol use. What can I expect for my preventive care visit? Physical exam Your health care provider will check your: Height and weight. These may be used to calculate your BMI (body mass index). BMI is a measurement that tells if you are at a healthy weight. Heart rate and blood pressure. Body temperature. Skin for abnormal spots. Counseling Your health care provider may ask you questions about your: Past medical  problems. Family's medical history. Alcohol, tobacco, and drug use. Emotional well-being. Home life and relationship well-being. Sexual activity. Diet, exercise, and sleep habits. History of falls. Memory and ability to understand (cognition). Work and work Statistician. Access to firearms. What immunizations do I need?  Vaccines are usually given at various ages, according to a schedule. Your health care provider will recommend vaccines for you based on your age, medicalhistory, and lifestyle or other factors, such as travel or where you work. What tests do I need? Blood tests Lipid and cholesterol levels. These may be checked every 5 years, or more often depending on your overall health. Hepatitis C  test. Hepatitis B test. Screening Lung cancer screening. You may have this screening every year starting at age 53 if you have a 30-pack-year history of smoking and currently smoke or have quit within the past 15 years. Colorectal cancer screening. All adults should have this screening starting at age 53 and continuing until age 80. Your health care provider may recommend screening at age 71 if you are at increased risk. You will have tests every 1-10 years, depending on your results and the type of screening test. Prostate cancer screening. Recommendations will vary depending on your family history and other risks. Genital exam to check for testicular cancer or hernias. Diabetes screening. This is done by checking your blood sugar (glucose) after you have not eaten for a while (fasting). You may have this done every 1-3 years. Abdominal aortic aneurysm (AAA) screening. You may need this if you are a current or former smoker. STD (sexually transmitted disease) testing, if you are at risk. Follow these instructions at home: Eating and drinking  Eat a diet that includes fresh fruits and vegetables, whole grains, lean protein, and low-fat dairy products. Limit your intake of foods with high amounts of sugar, saturated fats, and salt. Take vitamin and mineral supplements as recommended by your health care provider. Do not drink alcohol if your health care provider tells you not to drink. If you drink alcohol: Limit how much you have to 0-2 drinks a day. Be aware of how much alcohol is in your drink. In the [place] one drink equals one 12 oz bottle of beer (355 mL), one 5 oz glass of wine (148 mL), or one 1 oz glass of hard liquor (44 mL).  Lifestyle Take daily care of your teeth and gums. Brush your teeth every morning and night with fluoride toothpaste. Floss one time each day. Stay active. Exercise for at least 30 minutes 5 or more days each week. Do not use any products that contain  nicotine or tobacco, such as cigarettes, e-cigarettes, and chewing tobacco. If you need help quitting, ask your health care provider. Do not use drugs. If you are sexually active, practice safe sex. Use a condom or other form of protection to prevent STIs (sexually transmitted infections). Talk with your health care provider about taking a low-dose aspirin or statin. Find healthy ways to cope with stress, such as: Meditation, yoga, or listening to music. Journaling. Talking to a trusted person. Spending time with friends and family. Safety Always wear your seat belt while driving or riding in a vehicle. Do not drive: If you have been drinking alcohol. Do not ride with someone who has been drinking. When you are tired or distracted. While texting. Wear a helmet and other protective equipment during sports activities. If you have firearms in your house, make sure you follow all gun safety procedures. What's next? Visit  your health care provider once a year for an annual wellness visit. Ask your health care provider how often you should have your eyes and teeth checked. Stay up to date on all vaccines. This information is not intended to replace advice given to you by your health care provider. Make sure you discuss any questions you have with your healthcare provider. Document Revised: 02/19/2019 Document Reviewed: 05/17/2018 Elsevier Patient Education  2022 [place],   Merri Ray, MD [place] 01/18/21 8:46 AM

## 2021-01-21 ENCOUNTER — Telehealth: Payer: Self-pay

## 2021-01-21 NOTE — Telephone Encounter (Signed)
Patient is calling in about his lab work, wondering if he needs to repeat labs or see a urologist.

## 2021-01-22 ENCOUNTER — Other Ambulatory Visit: Payer: Self-pay | Admitting: Family Medicine

## 2021-01-22 DIAGNOSIS — R972 Elevated prostate specific antigen [PSA]: Secondary | ICD-10-CM

## 2021-01-22 NOTE — Progress Notes (Signed)
See lab results - referred to urology for elevated PSA.

## 2021-01-28 LAB — HM COLONOSCOPY

## 2021-02-15 DIAGNOSIS — Z96612 Presence of left artificial shoulder joint: Secondary | ICD-10-CM | POA: Diagnosis not present

## 2021-03-01 DIAGNOSIS — R972 Elevated prostate specific antigen [PSA]: Secondary | ICD-10-CM | POA: Diagnosis not present

## 2021-03-08 DIAGNOSIS — R972 Elevated prostate specific antigen [PSA]: Secondary | ICD-10-CM | POA: Diagnosis not present

## 2021-03-08 DIAGNOSIS — N401 Enlarged prostate with lower urinary tract symptoms: Secondary | ICD-10-CM | POA: Diagnosis not present

## 2021-03-08 DIAGNOSIS — R3915 Urgency of urination: Secondary | ICD-10-CM | POA: Diagnosis not present

## 2021-04-02 ENCOUNTER — Telehealth (INDEPENDENT_AMBULATORY_CARE_PROVIDER_SITE_OTHER): Payer: Medicare HMO | Admitting: Family Medicine

## 2021-04-02 ENCOUNTER — Encounter: Payer: Self-pay | Admitting: Family Medicine

## 2021-04-02 ENCOUNTER — Other Ambulatory Visit: Payer: Self-pay

## 2021-04-02 DIAGNOSIS — R059 Cough, unspecified: Secondary | ICD-10-CM | POA: Diagnosis not present

## 2021-04-02 DIAGNOSIS — J069 Acute upper respiratory infection, unspecified: Secondary | ICD-10-CM

## 2021-04-02 MED ORDER — BENZONATATE 100 MG PO CAPS: 100.0000 mg | ORAL_CAPSULE | Freq: Three times a day (TID) | ORAL | 0 refills | Status: DC | PRN

## 2021-04-02 NOTE — Patient Instructions (Signed)
Good talking to you today.  I am glad to hear that you are starting to improve.  Based on your symptoms I suspect you do have a viral infection.  I do not think antibiotics are needed at this time, so can stop the amoxicillin.  I do recommend a COVID test today and if that is positive, let me know and we can discuss possible medication changes if needed.  Saline nasal spray may help with the congestion.  Mucinex is fine to use for cough, but I also sent Tessalon Perles to your pharmacy for cough if needed.  Get adequate sleep/rest, drink plenty of fluids, eat healthy meals, and I suspect you will continue to improve.  Let me know if there are questions.  Return to the clinic or go to the nearest emergency room if any of your symptoms worsen or new symptoms occur.  Upper Respiratory Infection, Adult An upper respiratory infection (URI) is a common viral infection of the nose, throat, and upper air passages that lead to the lungs. The most common type of URI is the common cold. URIs usually get better on their own, without medical treatment. What are the causes? A URI is caused by a virus. You may catch a virus by: Breathing in droplets from an infected person's cough or sneeze. Touching something that has been exposed to the virus (contaminated) and then touching your mouth, nose, or eyes. What increases the risk? You are more likely to get a URI if: You are very young or very old. It is autumn or winter. You have close contact with others, such as at a daycare, school, or health care facility. You smoke. You have long-term (chronic) heart or lung disease. You have a weakened disease-fighting (immune) system. You have nasal allergies or asthma. You are experiencing a lot of stress. You work in an area that has poor air circulation. You have poor nutrition. What are the signs or symptoms? A URI usually involves some of the following symptoms: Runny or stuffy (congested)  nose. Sneezing. Cough. Sore throat. Headache. Fatigue. Fever. Loss of appetite. Pain in your forehead, behind your eyes, and over your cheekbones (sinus pain). Muscle aches. Redness or irritation of the eyes. Pressure in the ears or face. How is this diagnosed? This condition may be diagnosed based on your medical history and symptoms, and a physical exam. Your health care provider may use a cotton swab to take a mucus sample from your nose (nasal swab). This sample can be tested to determine what virus is causing the illness. How is this treated? URIs usually get better on their own within 7-10 days. You can take steps at home to relieve your symptoms. Medicines cannot cure URIs, but your health care provider may recommend certain medicines to help relieve symptoms, such as: Over-the-counter cold medicines. Cough suppressants. Coughing is a type of defense against infection that helps to clear the respiratory system, so take these medicines only as recommended by your health care provider. Fever-reducing medicines. Follow these instructions at home: Activity Rest as needed. If you have a fever, stay home from work or school until your fever is gone or until your health care provider says you are no longer contagious. Your health care provider may have you wear a face mask to prevent your infection from spreading. Relieving symptoms Gargle with a salt-water mixture 3-4 times a day or as needed. To make a salt-water mixture, completely dissolve -1 tsp of salt in 1 cup of warm water.  Use a cool-mist humidifier to add moisture to the air. This can help you breathe more easily. Eating and drinking  Drink enough fluid to keep your urine pale yellow. Eat soups and other clear broths. General instructions  Take over-the-counter and prescription medicines only as told by your health care provider. These include cold medicines, fever reducers, and cough suppressants. Do not use any products  that contain nicotine or tobacco, such as cigarettes and e-cigarettes. If you need help quitting, ask your health care provider. Stay away from secondhand smoke. Stay up to date on all immunizations, including the yearly (annual) flu vaccine. Keep all follow-up visits as told by your health care provider. This is important. How to prevent the spread of infection to others  URIs can be passed from person to person (are contagious). To prevent the infection from spreading: Wash your hands often with soap and water. If soap and water are not available, use hand sanitizer. Avoid touching your mouth, face, eyes, or nose. Cough or sneeze into a tissue or your sleeve or elbow instead of into your hand or into the air. Contact a health care provider if: You are getting worse instead of better. You have a fever or chills. Your mucus is brown or red. You have yellow or brown discharge coming from your nose. You have pain in your face, especially when you bend forward. You have swollen neck glands. You have pain while swallowing. You have white areas in the back of your throat. Get help right away if: You have shortness of breath that gets worse. You have severe or persistent: Headache. Ear pain. Sinus pain. Chest pain. You have chronic lung disease along with any of the following: Wheezing. Prolonged cough. Coughing up blood. A change in your usual mucus. You have a stiff neck. You have changes in your: Vision. Hearing. Thinking. Mood. Summary An upper respiratory infection (URI) is a common infection of the nose, throat, and upper air passages that lead to the lungs. A URI is caused by a virus. URIs usually get better on their own within 7-10 days. Medicines cannot cure URIs, but your health care provider may recommend certain medicines to help relieve symptoms. This information is not intended to replace advice given to you by your health care provider. Make sure you discuss any  questions you have with your health care provider. Document Revised: 01/30/2020 Document Reviewed: 01/30/2020 Elsevier Patient Education  [place].

## 2021-04-02 NOTE — Progress Notes (Signed)
Virtual Visit via Video Note  I connected with Rodney Bailey. on 04/02/21 at 10:04 AM by a video enabled telemedicine application and verified that I am speaking with the correct person using two identifiers.  Patient location: home, wife present - consent obtained.  My location: office -[place].    I discussed the limitations, risks, security and privacy concerns of performing an evaluation and management service by telephone and the availability of in person appointments. I also discussed with the patient that there may be a patient responsible charge related to this service. The patient expressed understanding and agreed to proceed, consent obtained  Chief complaint:  Chief Complaint  Patient presents with   Cough    Pt reports last Sunday started with sinus drainage, tried mucinex and has taken amoxicillin (from dentist) 2 a day for 3 days, has been coughing more, no real improvement on ABX, pt denies fever     History of Present Illness: Rodney Bailey. is a 70 y.o. male  Cough Initial symptoms started about 5 days ago, runny nose. Treated with mucinex, mucinex sinus max. Had leftover amoxicillin from dentist. Taking 2 times per day for past 3 days.  No fever/bodyache. Min HA last night. None this am. No sore throat.  No sick contacts, no covid exposure. No covid testing.  Current symptoms - nasal congestion. Usually clear congestion. Feeling better overall.  Dry cough. No CP, dyspnea. Cough kept up some last night.  Has used saline NS in past, not this week.   Had covid vaccine and initial booster.     Patient Active Problem List   Diagnosis Date Noted   Osteoarthritis of left glenohumeral joint 10/03/2019   OA (osteoarthritis) of hip 05/29/2019   Other specified arthritis, left hip 05/29/2019   Pain of left hip joint 02/22/2019   Need for influenza vaccination 03/28/2017   Injury of toe on right foot 03/28/2017   Subungual hematoma of great toe of right foot  03/28/2017   Insomnia 11/02/2016   Nocturia 11/03/2015   Hematuria 04/26/2014   Past Medical History:  Diagnosis Date   Allergy    Arthritis    Past Surgical History:  Procedure Laterality Date   TOTAL HIP ARTHROPLASTY Left 05/29/2019   Procedure: TOTAL HIP ARTHROPLASTY ANTERIOR APPROACH SAME DAY DISCHARGE;  Surgeon: Gaynelle Arabian, MD;  Location: [facility];  Service: Orthopedics;  Laterality: Left;  155min   TOTAL SHOULDER ARTHROPLASTY Left 02/13/2020   Procedure: TOTAL SHOULDER ARTHROPLASTY;  Surgeon: Justice Britain, MD;  Location: [facility];  Service: Orthopedics;  Laterality: Left;  130min   No Known Allergies Prior to Admission medications   Medication Sig Start Date End Date Taking? Authorizing Provider  acetaminophen (TYLENOL) 500 MG tablet Take 500 mg by mouth every 6 (six) hours as needed for mild pain or moderate pain.    [provider]  Ascorbic Acid (VITAMIN C) 1000 MG tablet Take 1,000 mg by mouth daily.    [provider]  atorvastatin (LIPITOR) 10 MG tablet Take 1 tablet (10 mg total) by mouth daily. 01/18/21   Wendie Agreste, MD  Benfotiamine 150 MG CAPS Take 150 mg by mouth daily.    [provider]  Cholecalciferol (VITAMIN D-3) 125 MCG (5000 UT) TABS Take 5,000 Units by mouth daily.    [provider]  cyclobenzaprine (FLEXERIL) 10 MG tablet Take 1 tablet (10 mg total) by mouth 3 (three) times daily as needed for muscle spasms. Patient not taking: No  sig reported 02/13/20   [name], [name], PA-C  GLUCOSAMINE PO Take 1,500 mg by mouth daily.     [provider]  KRILL OIL PO Take 220 mg by mouth daily.    [provider]  Magnesium 400 MG TABS Take 400 mg by mouth 2 (two) times daily.    [provider]  Melatonin 5 MG TABS Take 5-10 mg by mouth at bedtime.    [provider]  naproxen (NAPROSYN) 500 MG tablet Take 1 tablet (500 mg total) by mouth 2 (two) times daily with a meal. Patient not taking: No  sig reported 02/13/20   [name], [name], PA-C  ondansetron (ZOFRAN) 4 MG tablet Take 1 tablet (4 mg total) by mouth every 8 (eight) hours as needed for nausea or vomiting. Patient not taking: No sig reported 02/13/20   [name], [name], PA-C  OVER THE COUNTER MEDICATION Take 280 mg by mouth daily. [place]    [provider]  SAW PALMETTO, SERENOA REPENS, PO Take 800 mg by mouth daily.     [provider]  traZODone (DESYREL) 100 MG tablet TAKE HALF TO ONE TABLET BY MOUTH AT BEDTIME AS NEEDED FOR SLEEP 01/18/21   Wendie Agreste, MD  TURMERIC PO Take 1,200 mg by mouth daily.     [provider]   Social History   Socioeconomic History   Marital status: Married    Spouse name: Not on file   Number of children: Not on file   Years of education: Not on file   Highest education level: Not on file  Occupational History   Not on file  Tobacco Use   Smoking status: Never   Smokeless tobacco: Never  Vaping Use   Vaping Use: Never used  Substance and Sexual Activity   Alcohol use: No   Drug use: No   Sexual activity: Yes    Birth control/protection: None    Comment: SEX PARTNERS IN THE LAST 12 MONTHS 1 AND CURRENT BIRTH CONTROL - NOT NEEDED  Other Topics Concern   Not on file  Social History Narrative   EXERCISE WALKING AND WEIGHT TRAINING 5-6 DAYS/WEEK FOR 30 MINUTES   His wife died 03-20-2017   Social Determinants of Health   Financial Resource Strain: Not on file  Food Insecurity: Not on file  Transportation Needs: Not on file  Physical Activity: Not on file  Stress: Not on file  Social Connections: Not on file  Intimate Partner Violence: Not on file    Observations/Objective: There were no vitals filed for this visit. Nontoxic appearance on video, no respiratory distress, speaking in full sentences.  Coherent responses.  No cough during exam/video visit.  No audible wheeze, or stridor.  Normal voice.  All questions were answered with understanding of  plan expressed.  Assessment and Plan: Cough, unspecified type - Plan: benzonatate (TESSALON) 100 MG capsule  Upper respiratory tract infection, unspecified type Suspected viral upper respiratory infection.  Recommended stopping the leftover antibiotics.  Potential risks versus benefits of antibiotics discussed and typical indications for bacterial infections.  Current symptoms appear to be viral and are improving.  Symptomatic care discussed with fluids, rest, saline nasal spray, continue Mucinex as needed, Tessalon Perles prescribed if needed.  Plans to check COVID test today and will call if positive.  RTC/ER precautions given.  Follow Up Instructions:  As needed I discussed the assessment and treatment plan with the patient. The patient was provided an opportunity to ask questions and all were  answered. The patient agreed with the plan and demonstrated an understanding of the instructions.   The patient was advised to call back or seek an in-person evaluation if the symptoms worsen or if the condition fails to improve as anticipated.    Wendie Agreste, MD

## 2021-04-05 ENCOUNTER — Encounter: Payer: Self-pay | Admitting: Emergency Medicine

## 2021-04-05 ENCOUNTER — Other Ambulatory Visit: Payer: Self-pay

## 2021-04-05 ENCOUNTER — Ambulatory Visit
Admission: EM | Admit: 2021-04-05 | Discharge: 2021-04-05 | Disposition: A | Discharge status: Home / Self Care | Payer: Medicare HMO | Attending: Emergency Medicine | Admitting: Emergency Medicine

## 2021-04-05 DIAGNOSIS — J0100 Acute maxillary sinusitis, unspecified: Secondary | ICD-10-CM | POA: Diagnosis not present

## 2021-04-05 DIAGNOSIS — J3089 Other allergic rhinitis: Secondary | ICD-10-CM

## 2021-04-05 MED ORDER — PREDNISONE 10 MG PO TABS: 20.0000 mg | ORAL_TABLET | Freq: Every day | ORAL | 0 refills | Status: AC

## 2021-04-05 MED ORDER — FLUTICASONE PROPIONATE 50 MCG/ACT NA SUSP: 2.0000 | Freq: Every day | NASAL | 0 refills | Status: AC

## 2021-04-05 NOTE — ED Triage Notes (Addendum)
Patient presents to [facility] for evaluatyion of 9 days of nasal congestion, with cough developing 3 days ago.  Concerned for sinus infection.  Denies headache, or SOB.  Negative home COVID test

## 2021-04-05 NOTE — ED Provider Notes (Signed)
[facility]    CSN: [id number] Arrival date & time: 04/05/21  1118      History   Chief Complaint Chief Complaint  Patient presents with   Cough    HPI Rodney Bailey. is a 70 y.o. male.   Patient presents to [facility] for evaluation of 9 days of nasal congestion, mild cough developing 3 days ago.  Patient states he is concerned he may have a sinus infection for sinus infection.  Denies sinus tenderness, sinus pressure, pain radiating to her teeth, purulent nasal drainage headache, or SOB.  Reports negative home COVID test.  The history is provided by the patient.   Past Medical History:  Diagnosis Date   Allergy    Arthritis     Patient Active Problem List   Diagnosis Date Noted   Osteoarthritis of left glenohumeral joint 10/03/2019   OA (osteoarthritis) of hip 05/29/2019   Other specified arthritis, left hip 05/29/2019   Pain of left hip joint 02/22/2019   Need for influenza vaccination 03/28/2017   Injury of toe on right foot 03/28/2017   Subungual hematoma of great toe of right foot 03/28/2017   Insomnia 11/02/2016   Nocturia 11/03/2015   Hematuria 04/26/2014    Past Surgical History:  Procedure Laterality Date   TOTAL HIP ARTHROPLASTY Left 05/29/2019   Procedure: TOTAL HIP ARTHROPLASTY ANTERIOR APPROACH SAME DAY DISCHARGE;  Surgeon: Gaynelle Arabian, MD;  Location: [facility];  Service: Orthopedics;  Laterality: Left;  126min   TOTAL SHOULDER ARTHROPLASTY Left 02/13/2020   Procedure: TOTAL SHOULDER ARTHROPLASTY;  Surgeon: Justice Britain, MD;  Location: [facility];  Service: Orthopedics;  Laterality: Left;  163min       Home Medications    Prior to Admission medications   Medication Sig Start Date End Date Taking? Authorizing Provider  fluticasone (FLONASE) 50 MCG/ACT nasal spray Place 2 sprays into both nostrils daily. 04/05/21 05/05/21 Yes Lynden Oxford Scales, PA-C  predniSONE (DELTASONE) 10 MG tablet Take 2 tablets (20 mg total) by mouth daily for 3  days. 04/05/21 04/08/21 Yes Lynden Oxford Scales, PA-C  acetaminophen (TYLENOL) 500 MG tablet Take 500 mg by mouth every 6 (six) hours as needed for mild pain or moderate pain.    [provider]  Ascorbic Acid (VITAMIN C) 1000 MG tablet Take 1,000 mg by mouth daily.    [provider]  atorvastatin (LIPITOR) 10 MG tablet Take 1 tablet (10 mg total) by mouth daily. 01/18/21   Wendie Agreste, MD  Benfotiamine 150 MG CAPS Take 150 mg by mouth daily.    [provider]  benzonatate (TESSALON) 100 MG capsule Take 1 capsule (100 mg total) by mouth 3 (three) times daily as needed for cough. 04/02/21   Wendie Agreste, MD  Cholecalciferol (VITAMIN D-3) 125 MCG (5000 UT) TABS Take 5,000 Units by mouth daily.    [provider]  GLUCOSAMINE PO Take 1,500 mg by mouth daily.     [provider]  KRILL OIL PO Take 220 mg by mouth daily.    [provider]  Magnesium 400 MG TABS Take 400 mg by mouth 2 (two) times daily.    [provider]  Melatonin 5 MG TABS Take 5-10 mg by mouth at bedtime.    [provider]  OVER THE COUNTER MEDICATION Take 280 mg by mouth daily. [place]    [provider]  SAW PALMETTO, SERENOA REPENS, PO Take 800 mg by mouth daily.  [provider]  traZODone (DESYREL) 100 MG tablet TAKE HALF TO ONE TABLET BY MOUTH AT BEDTIME AS NEEDED FOR SLEEP 01/18/21   Wendie Agreste, MD  TURMERIC PO Take 1,200 mg by mouth daily.     [provider]    Family History Family History  Problem Relation Age of Onset   Heart disease Mother        CHF   Stroke Father     Social History Social History   Tobacco Use   Smoking status: Never   Smokeless tobacco: Never  Vaping Use   Vaping Use: Never used  Substance Use Topics   Alcohol use: No   Drug use: No     Allergies   Patient has no known allergies.   Review of Systems Review of Systems  Respiratory:  Positive for  cough.   Pertinent findings noted in history of present illness.    Physical Exam Triage Vital Signs ED Triage Vitals  Enc Vitals Group     BP 04/02/21 0827 (!) 147/82     Pulse Rate 04/02/21 0827 72     Resp 04/02/21 0827 18     Temp 04/02/21 0827 98.3 F (36.8 C)     Temp Source 04/02/21 0827 Oral     SpO2 04/02/21 0827 98 %     Weight --      Height --      Head Circumference --      Peak Flow --      Pain Score 04/02/21 0826 5     Pain Loc --      Pain Edu? --      Excl. in [place]? --    No data found.  Updated Vital Signs BP (!) 171/75 (BP Location: Right Arm)   Pulse 61   Temp 99 F (37.2 C) (Oral)   Resp 18   SpO2 94%   Visual Acuity Right Eye Distance:   Left Eye Distance:   Bilateral Distance:    Right Eye Near:   Left Eye Near:    Bilateral Near:     Physical Exam Vitals and nursing note reviewed.  Constitutional:      General: He is not in acute distress.    Appearance: Normal appearance. He is not ill-appearing.  HENT:     Head: Normocephalic and atraumatic.     Salivary Glands: Right salivary gland is not diffusely enlarged or tender. Left salivary gland is not diffusely enlarged or tender.     Right Ear: Tympanic membrane, ear canal and external ear normal. No drainage. No middle ear effusion. There is no impacted cerumen. Tympanic membrane is not erythematous or bulging.     Left Ear: Tympanic membrane, ear canal and external ear normal. No drainage.  No middle ear effusion. There is no impacted cerumen. Tympanic membrane is not erythematous or bulging.     Nose: Nose normal. No nasal deformity, septal deviation, mucosal edema, congestion or rhinorrhea.     Right Turbinates: Not enlarged, swollen or pale.     Left Turbinates: Not enlarged, swollen or pale.     Right Sinus: No maxillary sinus tenderness or frontal sinus tenderness.     Left Sinus: No maxillary sinus tenderness or frontal sinus tenderness.     Mouth/Throat:     Lips: Pink. No  lesions.     Mouth: Mucous membranes are moist. No oral lesions.     Pharynx: Oropharynx is clear. Uvula midline. No posterior oropharyngeal erythema  or uvula swelling.     Tonsils: No tonsillar exudate. 0 on the right. 0 on the left.  Eyes:     General: Lids are normal.        Right eye: No discharge.        Left eye: No discharge.     Extraocular Movements: Extraocular movements intact.     Conjunctiva/sclera: Conjunctivae normal.     Right eye: Right conjunctiva is not injected.     Left eye: Left conjunctiva is not injected.  Neck:     Trachea: Trachea and phonation normal.  Cardiovascular:     Rate and Rhythm: Normal rate and regular rhythm.     Pulses: Normal pulses.     Heart sounds: Normal heart sounds. No murmur heard.   No friction rub. No gallop.  Pulmonary:     Effort: Pulmonary effort is normal. No accessory muscle usage, prolonged expiration or respiratory distress.     Breath sounds: Normal breath sounds. No stridor, decreased air movement or transmitted upper airway sounds. No decreased breath sounds, wheezing, rhonchi or rales.  Chest:     Chest wall: No tenderness.  Musculoskeletal:        General: Normal range of motion.     Cervical back: Normal range of motion and neck supple. Normal range of motion.  Lymphadenopathy:     Cervical: No cervical adenopathy.  Skin:    General: Skin is warm and dry.     Findings: No erythema or rash.  Neurological:     General: No focal deficit present.     Mental Status: He is alert and oriented to person, place, and time.  Psychiatric:        Mood and Affect: Mood normal.        Behavior: Behavior normal.     UC Treatments / Results  Labs (all labs ordered are listed, but only abnormal results are displayed) Labs Reviewed - No data to display  EKG   Radiology No results found.  Procedures Procedures (including critical care time)  Medications Ordered in UC Medications - No data to display  Initial  Impression / Assessment and Plan / UC Course  I have reviewed the triage vital signs and the nursing notes.  Pertinent labs & imaging results that were available during my care of the patient were reviewed by me and considered in my medical decision making (see chart for details).     Physical exam today is unremarkable.  Patient advised that I am not concerned about him having a type of bacterial sinus infection.  I provided patient with a 3-day course of Deltasone as well as a nasal steroid to address his symptoms of rhinorrhea, mild congestion and mild cough.  Patient verbalized understanding and agreement of plan as discussed.  All questions were addressed during visit.  Please see discharge instructions below for further details of plan.  Final Clinical Impressions(s) / UC Diagnoses   Final diagnoses:  Non-seasonal allergic rhinitis due to other allergic trigger  Subacute maxillary sinusitis     Discharge Instructions      For your rhinitis and sinusitis that is most likely due to allergies from the prolonged fall season this year, I recommend 3 days of prednisone 20 mg and Flonase nasal steroid, 2 sprays daily for 5 to 7 days then decrease to once daily until the leaves are gone.     ED Prescriptions     Medication Sig Dispense Auth. Provider   fluticasone Asencion Islam)  50 MCG/ACT nasal spray Place 2 sprays into both nostrils daily. 18 mL Lynden Oxford Scales, PA-C   predniSONE (DELTASONE) 10 MG tablet Take 2 tablets (20 mg total) by mouth daily for 3 days. 6 tablet Lynden Oxford Scales, PA-C      PDMP not reviewed this encounter.    Lynden Oxford Scales, PA-C 04/05/21 1500

## 2021-04-05 NOTE — Discharge Instructions (Addendum)
For your rhinitis and sinusitis that is most likely due to allergies from the prolonged fall season this year, I recommend 3 days of prednisone 20 mg and Flonase nasal steroid, 2 sprays daily for 5 to 7 days then decrease to once daily until the leaves are gone.

## 2021-04-26 DIAGNOSIS — M25512 Pain in left shoulder: Secondary | ICD-10-CM | POA: Diagnosis not present

## 2021-06-02 DIAGNOSIS — Z1211 Encounter for screening for malignant neoplasm of colon: Secondary | ICD-10-CM | POA: Diagnosis not present

## 2021-06-02 DIAGNOSIS — K635 Polyp of colon: Secondary | ICD-10-CM | POA: Diagnosis not present

## 2021-06-02 DIAGNOSIS — D124 Benign neoplasm of descending colon: Secondary | ICD-10-CM | POA: Diagnosis not present

## 2021-06-15 DIAGNOSIS — E785 Hyperlipidemia, unspecified: Secondary | ICD-10-CM | POA: Diagnosis not present

## 2021-06-15 DIAGNOSIS — J309 Allergic rhinitis, unspecified: Secondary | ICD-10-CM | POA: Diagnosis not present

## 2021-06-15 DIAGNOSIS — E663 Overweight: Secondary | ICD-10-CM | POA: Diagnosis not present

## 2021-06-15 DIAGNOSIS — Z6826 Body mass index (BMI) 26.0-26.9, adult: Secondary | ICD-10-CM | POA: Diagnosis not present

## 2021-06-15 DIAGNOSIS — G4700 Insomnia, unspecified: Secondary | ICD-10-CM | POA: Diagnosis not present

## 2021-06-15 DIAGNOSIS — Z96649 Presence of unspecified artificial hip joint: Secondary | ICD-10-CM | POA: Diagnosis not present

## 2021-06-15 DIAGNOSIS — R030 Elevated blood-pressure reading, without diagnosis of hypertension: Secondary | ICD-10-CM | POA: Diagnosis not present

## 2021-06-15 DIAGNOSIS — M1990 Unspecified osteoarthritis, unspecified site: Secondary | ICD-10-CM | POA: Diagnosis not present

## 2021-06-15 DIAGNOSIS — Z8249 Family history of ischemic heart disease and other diseases of the circulatory system: Secondary | ICD-10-CM | POA: Diagnosis not present

## 2021-07-21 ENCOUNTER — Ambulatory Visit (INDEPENDENT_AMBULATORY_CARE_PROVIDER_SITE_OTHER): Payer: Medicare HMO | Admitting: Family Medicine

## 2021-07-21 ENCOUNTER — Encounter: Payer: Self-pay | Admitting: Family Medicine

## 2021-07-21 DIAGNOSIS — G4700 Insomnia, unspecified: Secondary | ICD-10-CM

## 2021-07-21 DIAGNOSIS — E785 Hyperlipidemia, unspecified: Secondary | ICD-10-CM

## 2021-07-21 MED ORDER — ATORVASTATIN CALCIUM 10 MG PO TABS: 10.0000 mg | ORAL_TABLET | Freq: Every day | ORAL | 2 refills | Status: DC

## 2021-07-21 MED ORDER — TRAZODONE HCL 100 MG PO TABS: ORAL_TABLET | ORAL | 3 refills | Status: DC

## 2021-07-21 NOTE — Patient Instructions (Signed)
Thanks for coming in today. No med changes. Take care.

## 2021-07-21 NOTE — Progress Notes (Signed)
Subjective:  Patient ID: Rodney Daub., male    DOB: August 21, 1950  Age: 71 y.o. MRN: [id number]  CC:  Chief Complaint  Patient presents with   Insomnia    Pt reports sleeps well with trazodone no refill  at this time    Hyperlipidemia    Pt due for recheck and refills today     HPI Rodney Bailey. presents for   Hyperlipidemia: Lipitor 10 mg daily. No new myalgias.  Fasting labs today.  Lab Results  Component Value Date   CHOL 176 01/18/2021   HDL 46.70 01/18/2021   LDLCALC 111 (H) 01/18/2021   TRIG 91.0 01/18/2021   CHOLHDL 4 01/18/2021   Lab Results  Component Value Date   ALT 26 01/18/2021   AST 23 01/18/2021   ALKPHOS 64 01/18/2021   BILITOT 2.0 (H) 01/18/2021   Insomnia Well-controlled with trazodone 100mg  qhs.   Elevated PSA: Referred to urology last year. PSA increased (2.7-8.3) but thought to be post ejaculatory and had ridden bike. Repeat 1.36 at urology - follow up as needed.   2 polyps last year on colonoscopy - Dr. Collene Mares - repeat in 7 years.   History Patient Active Problem List   Diagnosis Date Noted   Osteoarthritis of left glenohumeral joint 10/03/2019   OA (osteoarthritis) of hip 05/29/2019   Other specified arthritis, left hip 05/29/2019   Pain of left hip joint 02/22/2019   Need for influenza vaccination 03/28/2017   Injury of toe on right foot 03/28/2017   Subungual hematoma of great toe of right foot 03/28/2017   Insomnia 11/02/2016   Nocturia 11/03/2015   Hematuria 04/26/2014   Past Medical History:  Diagnosis Date   Allergy    Arthritis    Past Surgical History:  Procedure Laterality Date   TOTAL HIP ARTHROPLASTY Left 05/29/2019   Procedure: TOTAL HIP ARTHROPLASTY ANTERIOR APPROACH SAME DAY DISCHARGE;  Surgeon: Gaynelle Arabian, MD;  Location: [facility];  Service: Orthopedics;  Laterality: Left;  170min   TOTAL SHOULDER ARTHROPLASTY Left 02/13/2020   Procedure: TOTAL SHOULDER ARTHROPLASTY;  Surgeon: Justice Britain, MD;  Location:  [facility];  Service: Orthopedics;  Laterality: Left;  187min   No Known Allergies Prior to Admission medications   Medication Sig Start Date End Date Taking? Authorizing Provider  acetaminophen (TYLENOL) 500 MG tablet Take 500 mg by mouth every 6 (six) hours as needed for mild pain or moderate pain.   Yes [provider]  Ascorbic Acid (VITAMIN C) 1000 MG tablet Take 1,000 mg by mouth daily.   Yes [provider]  atorvastatin (LIPITOR) 10 MG tablet Take 1 tablet (10 mg total) by mouth daily. 01/18/21  Yes Wendie Agreste, MD  Benfotiamine 150 MG CAPS Take 150 mg by mouth daily.   Yes [provider]  Cholecalciferol (VITAMIN D-3) 125 MCG (5000 UT) TABS Take 5,000 Units by mouth daily.   Yes [provider]  GLUCOSAMINE PO Take 1,500 mg by mouth daily.    Yes [provider]  KRILL OIL PO Take 220 mg by mouth daily.   Yes [provider]  Magnesium 400 MG TABS Take 400 mg by mouth 2 (two) times daily.   Yes [provider]  Melatonin 5 MG TABS Take 5-10 mg by mouth at bedtime.   Yes [provider]  OVER THE COUNTER MEDICATION Take 280 mg by mouth daily. [place]   Yes [provider]  SAW PALMETTO, SERENOA REPENS, PO  Take 800 mg by mouth daily.    Yes [provider]  traZODone (DESYREL) 100 MG tablet TAKE HALF TO ONE TABLET BY MOUTH AT BEDTIME AS NEEDED FOR SLEEP 01/18/21  Yes Wendie Agreste, MD  TURMERIC PO Take 1,200 mg by mouth daily.    Yes [provider]  fluticasone (FLONASE) 50 MCG/ACT nasal spray Place 2 sprays into both nostrils daily. 04/05/21 05/05/21  Lynden Oxford Scales, PA-C   Social History   Socioeconomic History   Marital status: Married    Spouse name: Not on file   Number of children: Not on file   Years of education: Not on file   Highest education level: Not on file  Occupational History   Not on file  Tobacco Use   Smoking status: Never   Smokeless tobacco:  Never  Vaping Use   Vaping Use: Never used  Substance and Sexual Activity   Alcohol use: No   Drug use: No   Sexual activity: Yes    Birth control/protection: None    Comment: SEX PARTNERS IN THE LAST 12 MONTHS 1 AND CURRENT BIRTH CONTROL - NOT NEEDED  Other Topics Concern   Not on file  Social History Narrative   EXERCISE WALKING AND WEIGHT TRAINING 5-6 DAYS/WEEK FOR 30 MINUTES   His wife died March 10, 2017   Social Determinants of Health   Financial Resource Strain: Not on file  Food Insecurity: Not on file  Transportation Needs: Not on file  Physical Activity: Not on file  Stress: Not on file  Social Connections: Not on file  Intimate Partner Violence: Not on file    Review of Systems  Constitutional:  Negative for fatigue and unexpected weight change.  Eyes:  Negative for visual disturbance.  Respiratory:  Negative for cough, chest tightness and shortness of breath.   Cardiovascular:  Negative for chest pain, palpitations and leg swelling.  Gastrointestinal:  Negative for abdominal pain and blood in stool.  Neurological:  Negative for dizziness, light-headedness and headaches.    Objective:   Vitals:   07/21/21 1011  BP: 134/80  Pulse: (!) 50  Resp: 16  Temp: 98.1 F (36.7 C)  TempSrc: Temporal  SpO2: 97%  Weight: 167 lb (75.8 kg)  Height: 5\' 6"  (1.676 m)     Physical Exam Vitals reviewed.  Constitutional:      Appearance: He is well-developed.  HENT:     Head: Normocephalic and atraumatic.  Neck:     Vascular: No carotid bruit or JVD.  Cardiovascular:     Rate and Rhythm: Normal rate and regular rhythm.     Heart sounds: Normal heart sounds. No murmur heard. Pulmonary:     Effort: Pulmonary effort is normal.     Breath sounds: Normal breath sounds. No rales.  Musculoskeletal:     Right lower leg: No edema.     Left lower leg: No edema.  Skin:    General: Skin is warm and dry.  Neurological:     Mental Status: He is alert and oriented to person,  place, and time.  Psychiatric:        Mood and Affect: Mood normal.       Assessment & Plan:  Rodney Sans. is a 71 y.o. male . Hyperlipidemia, unspecified hyperlipidemia type - Plan: atorvastatin (LIPITOR) 10 MG tablet, Comprehensive metabolic panel, Lipid panel  -  Stable, tolerating current regimen. Medications refilled. Labs pending as above.   Insomnia, unspecified type - Plan: traZODone (DESYREL)  100 MG tablet  -  Stable, tolerating current regimen. Medications refilled. No parasomnias.   Psa planned at physical. Encouraging improved reading at urology.    Meds ordered this encounter  Medications   atorvastatin (LIPITOR) 10 MG tablet    Sig: Take 1 tablet (10 mg total) by mouth daily.    Dispense:  90 tablet    Refill:  2   traZODone (DESYREL) 100 MG tablet    Sig: TAKE HALF TO ONE TABLET BY MOUTH AT BEDTIME AS NEEDED FOR SLEEP    Dispense:  90 tablet    Refill:  3   Patient Instructions  Thanks for coming in today. No med changes. Take care.     Signed,   Merri Ray, MD [place] 07/21/21 10:57 AM

## 2021-07-22 LAB — COMPREHENSIVE METABOLIC PANEL
ALT: 26 U/L (ref 0–53)
AST: 26 U/L (ref 0–37)
Albumin: 4.5 g/dL (ref 3.5–5.2)
Alkaline Phosphatase: 66 U/L (ref 39–117)
BUN: 26 mg/dL — ABNORMAL HIGH (ref 6–23)
CO2: 29 mEq/L (ref 19–32)
Calcium: 9.6 mg/dL (ref 8.4–10.5)
Chloride: 105 mEq/L (ref 96–112)
Creatinine, Ser: 1.33 mg/dL (ref 0.40–1.50)
GFR: 54.34 mL/min — ABNORMAL LOW (ref >60.00)
Glucose, Bld: 94 mg/dL (ref 70–99)
Potassium: 4.7 mEq/L (ref 3.5–5.1)
Sodium: 139 mEq/L (ref 135–145)
Total Bilirubin: 1.4 mg/dL — ABNORMAL HIGH (ref 0.2–1.2)
Total Protein: 7.3 g/dL (ref 6.0–8.3)

## 2021-07-22 LAB — LIPID PANEL
Cholesterol: 159 mg/dL (ref 0–200)
HDL: 52.9 mg/dL (ref >39.00)
LDL Cholesterol: 96 mg/dL (ref 0–99)
NonHDL: 105.65
Total CHOL/HDL Ratio: 3
Triglycerides: 50 mg/dL (ref 0.0–149.0)
VLDL: 10 mg/dL (ref 0.0–40.0)

## 2022-01-26 DIAGNOSIS — L600 Ingrowing nail: Secondary | ICD-10-CM | POA: Diagnosis not present

## 2022-01-26 DIAGNOSIS — M25774 Osteophyte, right foot: Secondary | ICD-10-CM | POA: Diagnosis not present

## 2022-01-26 DIAGNOSIS — M25775 Osteophyte, left foot: Secondary | ICD-10-CM | POA: Diagnosis not present

## 2022-01-27 ENCOUNTER — Encounter: Payer: Self-pay | Admitting: Family Medicine

## 2022-01-27 ENCOUNTER — Ambulatory Visit (INDEPENDENT_AMBULATORY_CARE_PROVIDER_SITE_OTHER): Payer: Medicare HMO | Admitting: Family Medicine

## 2022-01-27 VITALS — BP 138/70 | HR 48 | Temp 98.1°F | Resp 18 | Ht 65.0 in | Wt 168.2 lb

## 2022-01-27 DIAGNOSIS — H9193 Unspecified hearing loss, bilateral: Secondary | ICD-10-CM | POA: Diagnosis not present

## 2022-01-27 DIAGNOSIS — L918 Other hypertrophic disorders of the skin: Secondary | ICD-10-CM | POA: Diagnosis not present

## 2022-01-27 DIAGNOSIS — Z0000 Encounter for general adult medical examination without abnormal findings: Secondary | ICD-10-CM

## 2022-01-27 DIAGNOSIS — Z1283 Encounter for screening for malignant neoplasm of skin: Secondary | ICD-10-CM | POA: Diagnosis not present

## 2022-01-27 NOTE — Progress Notes (Signed)
Subjective:  Patient ID: Rodney Daub., male    DOB: 1951/06/06  Age: 71 y.o. MRN: [id number]  CC:  Chief Complaint  Patient presents with   Annual Exam    Pt is fasting flu shot today    HPI Rodney Bailey. presents for Annual Exam  Care team Ortho, Dr. Onnie Graham, Jenetta Loges. Gastroenterology, Dr. Collene Mares colonoscopy December 2022 Urology, Dr. Jeffie Pollock, BPH, elevated PSA, elevated after bike ride, improved on recheck. Follow up as needed, recommended psa until age 60.  Dermatology, for [facility] in [place].   Hyperlipidemia: Lipitor 10 mg daily Lab Results  Component Value Date   CHOL 159 07/21/2021   HDL 52.90 07/21/2021   LDLCALC 96 07/21/2021   TRIG 50.0 07/21/2021   CHOLHDL 3 07/21/2021   Lab Results  Component Value Date   ALT 26 07/21/2021   AST 26 07/21/2021   ALKPHOS 66 07/21/2021   BILITOT 1.4 (H) 07/21/2021   Insomnia Trazodone 100 mg nightly.      01/27/2022    9:09 AM 07/21/2021   10:14 AM 04/02/2021    9:36 AM 01/18/2021    8:01 AM 07/15/2020   10:45 AM  Depression screen PHQ 2/9  Decreased Interest 0 0 0 0 0  Down, Depressed, Hopeless 0 0 0 0 0  PHQ - 2 Score 0 0 0 0 0  Altered sleeping 0 1  1   Tired, decreased energy 0 1  0   Change in appetite 0 0  0   Feeling bad or failure about yourself  0 0  0   Trouble concentrating 0 0  0   Moving slowly or fidgety/restless 0 0  0   Suicidal thoughts 0 0  0   PHQ-9 Score 0 2  1   Difficult doing work/chores    Not difficult at all     Health Maintenance  Topic Date Due   INFLUENZA VACCINE  01/04/2022   COVID-19 Vaccine (4 - Pfizer risk series) 02/12/2022 (Originally 05/22/2020)   TETANUS/TDAP  03/20/2023   COLONOSCOPY (Pts 45-34yr Insurance coverage will need to be confirmed)  01/29/2031   Pneumonia Vaccine 70 Years old  Completed   Hepatitis C Screening  Completed   Zoster Vaccines- Shingrix  Completed   HPV VACCINES  Aged Out  Colonoscopy December 2022, repeat 7  years due to polyps. Prostate: followed by urology as above. 1.36 on 03/01/21. Declines repeat until next visit.  No recent derm visit. No new skin lesions - some skin tags in groin- would like to see dermatology.  Lab Results  Component Value Date   PSA1 2.7 01/18/2019   PSA1 2.6 12/05/2017   PSA 8.38 Repeated and verified X2. (H) 01/18/2021   PSA 1.60 11/03/2015   PSA 1.47 03/11/2014    Immunization History  Administered Date(s) Administered   Fluad Quad(high Dose 65+) 01/19/2019   Influenza, High Dose Seasonal PF 02/27/2018   Influenza,inj,Quad PF,6+ Mos 03/28/2017   Influenza,inj,quad, With Preservative 03/07/2019   Influenza-Unspecified 02/27/2018   PFIZER(Purple Top)SARS-COV-2 Vaccination 07/15/2019, 08/09/2019, 03/27/2020   Pneumococcal Conjugate-13 03/10/2017   Pneumococcal Polysaccharide-23 11/11/2019   Tdap 03/19/2013   Zoster Recombinat (Shingrix) 01/19/2019, 04/08/2019  Covid vaccine - bivalent booster recommended.  Plans on high dose flu vaccine at pharmacy.   No results found. Wears glasses. Overdue for optho eval - 3 years ago.   Dental:1 week ago, every 6 months.   Alcohol:none.   Tobacco: none  Exercise:  walking or exercise bike daily. Up to 1 hr per day.    Labs in 07/21/21 noted.   Fall screening    01/27/2022    9:09 AM 07/21/2021   10:14 AM 04/02/2021    9:36 AM 01/18/2021    8:00 AM 07/15/2020   10:45 AM  Fall Risk   Falls in the past year? 0 0 0 0 0  Number falls in past yr: 0 0 0 0   Injury with Fall? 0 0 0 0   Risk for fall due to : No Fall Risks No Fall Risks No Fall Risks No Fall Risks   Follow up Falls evaluation completed Falls evaluation completed Falls evaluation completed  Falls evaluation completed   Lighting in home:adequate Loose rugs/carpets/pets: dog at home. Dog stays at distance.  Stairs: single level.  Grab bars in bathroom:does have.  Timed up and go:9 seconds, no instability. Toenail procedure yesterday.   Functional  Status Survey: Is the patient deaf or have difficulty hearing?: No Does the patient have difficulty seeing, even when wearing glasses/contacts?: No Does the patient have difficulty concentrating, remembering, or making decisions?: No Does the patient have difficulty walking or climbing stairs?: No Does the patient have difficulty dressing or bathing?: No Does the patient have difficulty doing errands alone such as visiting a doctor's office or shopping?: No Hearing changes - wife has noticed decreased hearing, he has had to have phrases repeated. No prior hearing test.   Memory Screen:    01/27/2022    9:27 AM 01/18/2021    8:40 AM 01/18/2021    8:26 AM 01/18/2019   10:05 AM 12/05/2017    9:21 AM  6CIT Screen  What Year? 0 points 0 points 0 points 0 points 0 points  What month? 0 points 0 points 0 points 0 points 0 points  What time? 0 points 0 points 0 points 0 points 0 points  Count back from 20 0 points 0 points 0 points 0 points 0 points  Months in reverse 0 points 0 points 0 points 0 points 0 points  Repeat phrase 0 points 2 points 4 points 0 points 0 points  Total Score 0 points 2 points 4 points 0 points 0 points    Alcohol Screening: [place] Office Visit from 01/27/2022 in [place]  AUDIT-C Score 0       Advanced Directives:  Has living will and HCPOA. No changes requested.    History Patient Active Problem List   Diagnosis Date Noted   Osteoarthritis of left glenohumeral joint 10/03/2019   OA (osteoarthritis) of hip 05/29/2019   Other specified arthritis, left hip 05/29/2019   Pain of left hip joint 02/22/2019   Need for influenza vaccination 03/28/2017   Injury of toe on right foot 03/28/2017   Subungual hematoma of great toe of right foot 03/28/2017   Insomnia 11/02/2016   Nocturia 11/03/2015   Hematuria 04/26/2014   Past Medical History:  Diagnosis Date   Allergy    Arthritis    Past Surgical History:   Procedure Laterality Date   TOTAL HIP ARTHROPLASTY Left 05/29/2019   Procedure: TOTAL HIP ARTHROPLASTY ANTERIOR APPROACH SAME DAY DISCHARGE;  Surgeon: Gaynelle Arabian, MD;  Location: [facility];  Service: Orthopedics;  Laterality: Left;  152mn   TOTAL SHOULDER ARTHROPLASTY Left 02/13/2020   Procedure: TOTAL SHOULDER ARTHROPLASTY;  Surgeon: SJustice Britain MD;  Location: [facility];  Service: Orthopedics;  Laterality: Left;  1235m   No Known  Allergies Prior to Admission medications   Medication Sig Start Date End Date Taking? Authorizing Provider  Ascorbic Acid (VITAMIN C) 1000 MG tablet Take 1,000 mg by mouth daily.   Yes [provider]  atorvastatin (LIPITOR) 10 MG tablet Take 1 tablet (10 mg total) by mouth daily. 07/21/21  Yes Wendie Agreste, MD  Benfotiamine 150 MG CAPS Take 150 mg by mouth daily.   Yes [provider]  Cholecalciferol (VITAMIN D-3) 125 MCG (5000 UT) TABS Take 5,000 Units by mouth daily.   Yes [provider]  GLUCOSAMINE PO Take 1,500 mg by mouth daily.    Yes [provider]  KRILL OIL PO Take 220 mg by mouth daily.   Yes [provider]  Magnesium 400 MG TABS Take 400 mg by mouth 2 (two) times daily.   Yes [provider]  Melatonin 5 MG TABS Take 5-10 mg by mouth at bedtime.   Yes [provider]  OVER THE COUNTER MEDICATION Take 280 mg by mouth daily. Horsetail   Yes [provider]  SAW PALMETTO, SERENOA REPENS, PO Take 800 mg by mouth daily.    Yes [provider]  traZODone (DESYREL) 100 MG tablet TAKE HALF TO ONE TABLET BY MOUTH AT BEDTIME AS NEEDED FOR SLEEP 07/21/21  Yes Wendie Agreste, MD  TURMERIC PO Take 1,200 mg by mouth daily.    Yes [provider]  acetaminophen (TYLENOL) 500 MG tablet Take 500 mg by mouth every 6 (six) hours as needed for mild pain or moderate pain. Patient not taking: Reported on 01/27/2022    [provider]  fluticasone (FLONASE) 50 MCG/ACT  nasal spray Place 2 sprays into both nostrils daily. 04/05/21 05/05/21  Lynden Oxford Scales, PA-C   Social History   Socioeconomic History   Marital status: Married    Spouse name: Not on file   Number of children: Not on file   Years of education: Not on file   Highest education level: Not on file  Occupational History   Not on file  Tobacco Use   Smoking status: Never   Smokeless tobacco: Never  Vaping Use   Vaping Use: Never used  Substance and Sexual Activity   Alcohol use: No   Drug use: No   Sexual activity: Yes    Birth control/protection: None    Comment: SEX PARTNERS IN THE LAST 12 MONTHS 1 AND CURRENT BIRTH CONTROL - NOT NEEDED  Other Topics Concern   Not on file  Social History Narrative   EXERCISE WALKING AND WEIGHT TRAINING 5-6 DAYS/WEEK FOR 30 MINUTES   His wife died 03-03-17   Social Determinants of Health   Financial Resource Strain: Not on file  Food Insecurity: Not on file  Transportation Needs: Not on file  Physical Activity: Not on file  Stress: Not on file  Social Connections: Not on file  Intimate Partner Violence: Not on file   Review of Systems  13 point review of systems per patient health survey noted.  Negative other than as indicated above or in HPI.   Objective:   Vitals:   01/27/22 0912  BP: 138/70  Pulse: (!) 48  Resp: 18  Temp: 98.1 F (36.7 C)  SpO2: 99%  Weight: 168 lb 3.2 oz (76.3 kg)  Height: '5\' 5"'$  (1.651 m)     Physical Exam Vitals reviewed.  Constitutional:      Appearance: He is well-developed.  HENT:     Head: Normocephalic and  atraumatic.     Right Ear: External ear normal.     Left Ear: External ear normal.  Eyes:     Conjunctiva/sclera: Conjunctivae normal.     Pupils: Pupils are equal, round, and reactive to light.  Neck:     Thyroid: No thyromegaly.  Cardiovascular:     Rate and Rhythm: Normal rate and regular rhythm.     Heart sounds: Normal heart sounds.  Pulmonary:     Effort: Pulmonary  effort is normal. No respiratory distress.     Breath sounds: Normal breath sounds. No wheezing.  Abdominal:     General: There is no distension.     Palpations: Abdomen is soft.     Tenderness: There is no abdominal tenderness.  Musculoskeletal:        General: No tenderness. Normal range of motion.     Cervical back: Normal range of motion and neck supple.  Lymphadenopathy:     Cervical: No cervical adenopathy.  Skin:    General: Skin is warm and dry.  Neurological:     Mental Status: He is alert and oriented to person, place, and time.     Deep Tendon Reflexes: Reflexes are normal and symmetric.  Psychiatric:        Behavior: Behavior normal.     Assessment & Plan:  Rodney Hagger. is a 71 y.o. male . Annual physical exam  - -anticipatory guidance as below in AVS, screening labs above. Health maintenance items as above in HPI discussed/recommended as applicable.  High-dose flu, COVID booster planned at his pharmacy.  Recommended Optho eval/follow-up as overdue.  Skin tags, multiple acquired - Plan: Ambulatory referral to Dermatology Skin cancer screening - Plan: Ambulatory referral to Dermatology  - refer to dermatology.   Decreased hearing of both ears - Plan: Ambulatory referral to Audiology  - refer for hearing screen.   No orders of the defined types were placed in this encounter.  Patient Instructions  I would recommend follow up with dermatology for routine screening and to discuss skin tags.  I will place referral.   Schedule appointment with eye care provider.   I will refer you for hearing test.   Take care!  Preventive Care 73 Years and Older, Male Preventive care refers to lifestyle choices and visits with your health care provider that can promote health and wellness. Preventive care visits are also called wellness exams. What can I expect for my preventive care visit? Counseling During your preventive care visit, your health care provider may ask  about your: Medical history, including: Past medical problems. Family medical history. History of falls. Current health, including: Emotional well-being. Home life and relationship well-being. Sexual activity. Memory and ability to understand (cognition). Lifestyle, including: Alcohol, nicotine or tobacco, and drug use. Access to firearms. Diet, exercise, and sleep habits. Work and work Statistician. Sunscreen use. Safety issues such as seatbelt and bike helmet use. Physical exam Your health care provider will check your: Height and weight. These may be used to calculate your BMI (body mass index). BMI is a measurement that tells if you are at a healthy weight. Waist circumference. This measures the distance around your waistline. This measurement also tells if you are at a healthy weight and may help predict your risk of certain diseases, such as type 2 diabetes and high blood pressure. Heart rate and blood pressure. Body temperature. Skin for abnormal spots. What immunizations do I need?  Vaccines are usually given at various ages, according to  a schedule. Your health care provider will recommend vaccines for you based on your age, medical history, and lifestyle or other factors, such as travel or where you work. What tests do I need? Screening Your health care provider may recommend screening tests for certain conditions. This may include: Lipid and cholesterol levels. Diabetes screening. This is done by checking your blood sugar (glucose) after you have not eaten for a while (fasting). Hepatitis C test. Hepatitis B test. HIV (human immunodeficiency virus) test. STI (sexually transmitted infection) testing, if you are at risk. Lung cancer screening. Colorectal cancer screening. Prostate cancer screening. Abdominal aortic aneurysm (AAA) screening. You may need this if you are a current or former smoker. Talk with your health care provider about your test results, treatment  options, and if necessary, the need for more tests. Follow these instructions at home: Eating and drinking  Eat a diet that includes fresh fruits and vegetables, whole grains, lean protein, and low-fat dairy products. Limit your intake of foods with high amounts of sugar, saturated fats, and salt. Take vitamin and mineral supplements as recommended by your health care provider. Do not drink alcohol if your health care provider tells you not to drink. If you drink alcohol: Limit how much you have to 0-2 drinks a day. Know how much alcohol is in your drink. In the [place] one drink equals one 12 oz bottle of beer (355 mL), one 5 oz glass of wine (148 mL), or one 1 oz glass of hard liquor (44 mL). Lifestyle Brush your teeth every morning and night with fluoride toothpaste. Floss one time each day. Exercise for at least 30 minutes 5 or more days each week. Do not use any products that contain nicotine or tobacco. These products include cigarettes, chewing tobacco, and vaping devices, such as e-cigarettes. If you need help quitting, ask your health care provider. Do not use drugs. If you are sexually active, practice safe sex. Use a condom or other form of protection to prevent STIs. Take aspirin only as told by your health care provider. Make sure that you understand how much to take and what form to take. Work with your health care provider to find out whether it is safe and beneficial for you to take aspirin daily. Ask your health care provider if you need to take a cholesterol-lowering medicine (statin). Find healthy ways to manage stress, such as: Meditation, yoga, or listening to music. Journaling. Talking to a trusted person. Spending time with friends and family. Safety Always wear your seat belt while driving or riding in a vehicle. Do not drive: If you have been drinking alcohol. Do not ride with someone who has been drinking. When you are tired or distracted. While texting. If you  have been using any mind-altering substances or drugs. Wear a helmet and other protective equipment during sports activities. If you have firearms in your house, make sure you follow all gun safety procedures. Minimize exposure to UV radiation to reduce your risk of skin cancer. What's next? Visit your health care provider once a year for an annual wellness visit. Ask your health care provider how often you should have your eyes and teeth checked. Stay up to date on all vaccines. This information is not intended to replace advice given to you by your health care provider. Make sure you discuss any questions you have with your health care provider. Document Revised: 11/18/2020 Document Reviewed: 11/18/2020 Elsevier Patient Education  [place],  Merri Ray, MD [place], [facility] 01/27/22 10:09 AM

## 2022-01-27 NOTE — Patient Instructions (Addendum)
I would recommend follow up with dermatology for routine screening and to discuss skin tags.  I will place referral.   Schedule appointment with eye care provider.   I will refer you for hearing test.   Take care!  Preventive Care 82 Years and Older, Male Preventive care refers to lifestyle choices and visits with your health care provider that can promote health and wellness. Preventive care visits are also called wellness exams. What can I expect for my preventive care visit? Counseling During your preventive care visit, your health care provider may ask about your: Medical history, including: Past medical problems. Family medical history. History of falls. Current health, including: Emotional well-being. Home life and relationship well-being. Sexual activity. Memory and ability to understand (cognition). Lifestyle, including: Alcohol, nicotine or tobacco, and drug use. Access to firearms. Diet, exercise, and sleep habits. Work and work Statistician. Sunscreen use. Safety issues such as seatbelt and bike helmet use. Physical exam Your health care provider will check your: Height and weight. These may be used to calculate your BMI (body mass index). BMI is a measurement that tells if you are at a healthy weight. Waist circumference. This measures the distance around your waistline. This measurement also tells if you are at a healthy weight and may help predict your risk of certain diseases, such as type 2 diabetes and high blood pressure. Heart rate and blood pressure. Body temperature. Skin for abnormal spots. What immunizations do I need?  Vaccines are usually given at various ages, according to a schedule. Your health care provider will recommend vaccines for you based on your age, medical history, and lifestyle or other factors, such as travel or where you work. What tests do I need? Screening Your health care provider may recommend screening tests for certain conditions.  This may include: Lipid and cholesterol levels. Diabetes screening. This is done by checking your blood sugar (glucose) after you have not eaten for a while (fasting). Hepatitis C test. Hepatitis B test. HIV (human immunodeficiency virus) test. STI (sexually transmitted infection) testing, if you are at risk. Lung cancer screening. Colorectal cancer screening. Prostate cancer screening. Abdominal aortic aneurysm (AAA) screening. You may need this if you are a current or former smoker. Talk with your health care provider about your test results, treatment options, and if necessary, the need for more tests. Follow these instructions at home: Eating and drinking  Eat a diet that includes fresh fruits and vegetables, whole grains, lean protein, and low-fat dairy products. Limit your intake of foods with high amounts of sugar, saturated fats, and salt. Take vitamin and mineral supplements as recommended by your health care provider. Do not drink alcohol if your health care provider tells you not to drink. If you drink alcohol: Limit how much you have to 0-2 drinks a day. Know how much alcohol is in your drink. In the [place] one drink equals one 12 oz bottle of beer (355 mL), one 5 oz glass of wine (148 mL), or one 1 oz glass of hard liquor (44 mL). Lifestyle Brush your teeth every morning and night with fluoride toothpaste. Floss one time each day. Exercise for at least 30 minutes 5 or more days each week. Do not use any products that contain nicotine or tobacco. These products include cigarettes, chewing tobacco, and vaping devices, such as e-cigarettes. If you need help quitting, ask your health care provider. Do not use drugs. If you are sexually active, practice safe sex. Use a condom or other form  of protection to prevent STIs. Take aspirin only as told by your health care provider. Make sure that you understand how much to take and what form to take. Work with your health care provider to  find out whether it is safe and beneficial for you to take aspirin daily. Ask your health care provider if you need to take a cholesterol-lowering medicine (statin). Find healthy ways to manage stress, such as: Meditation, yoga, or listening to music. Journaling. Talking to a trusted person. Spending time with friends and family. Safety Always wear your seat belt while driving or riding in a vehicle. Do not drive: If you have been drinking alcohol. Do not ride with someone who has been drinking. When you are tired or distracted. While texting. If you have been using any mind-altering substances or drugs. Wear a helmet and other protective equipment during sports activities. If you have firearms in your house, make sure you follow all gun safety procedures. Minimize exposure to UV radiation to reduce your risk of skin cancer. What's next? Visit your health care provider once a year for an annual wellness visit. Ask your health care provider how often you should have your eyes and teeth checked. Stay up to date on all vaccines. This information is not intended to replace advice given to you by your health care provider. Make sure you discuss any questions you have with your health care provider. Document Revised: 11/18/2020 Document Reviewed: 11/18/2020 Elsevier Patient Education  [place].

## 2022-02-02 DIAGNOSIS — S91102A Unspecified open wound of left great toe without damage to nail, initial encounter: Secondary | ICD-10-CM | POA: Diagnosis not present

## 2022-02-02 DIAGNOSIS — S91101A Unspecified open wound of right great toe without damage to nail, initial encounter: Secondary | ICD-10-CM | POA: Diagnosis not present

## 2022-02-16 DIAGNOSIS — L309 Dermatitis, unspecified: Secondary | ICD-10-CM | POA: Diagnosis not present

## 2022-02-18 DIAGNOSIS — H903 Sensorineural hearing loss, bilateral: Secondary | ICD-10-CM | POA: Diagnosis not present

## 2022-03-08 ENCOUNTER — Telehealth: Payer: Self-pay

## 2022-03-08 ENCOUNTER — Ambulatory Visit (INDEPENDENT_AMBULATORY_CARE_PROVIDER_SITE_OTHER): Payer: Medicare HMO

## 2022-03-08 VITALS — Ht 66.0 in | Wt 168.0 lb

## 2022-03-08 DIAGNOSIS — Z0000 Encounter for general adult medical examination without abnormal findings: Secondary | ICD-10-CM

## 2022-03-08 NOTE — Progress Notes (Signed)
Subjective:   Rodney Bailey. is a 71 y.o. male who presents for Medicare Annual/Subsequent preventive examination.   Virtual Visit via Telephone Note  I connected with  Baldo Daub. on 03/08/22 at  9:45 AM EDT by telephone and verified that I am speaking with the correct person using two identifiers.  Location: Patient: home  Provider: [place]  Persons participating in the virtual visit: patient/Nurse Health Advisor   I discussed the limitations, risks, security and privacy concerns of performing an evaluation and management service by telephone and the availability of in person appointments. The patient expressed understanding and agreed to proceed.  Interactive audio and video telecommunications were attempted between this nurse and patient, however failed, due to patient having technical difficulties OR patient did not have access to video capability.  We continued and completed visit with audio only.  Some vital signs may be absent or patient reported.   Daphane Shepherd, LPN  Review of Systems     Cardiac Risk Factors include: advanced age (>13mn, >>66women);male gender     Objective:    Today's Vitals   03/08/22 0947  Weight: 168 lb (76.2 kg)  Height: '5\' 6"'$  (1.676 m)   Body mass index is 27.12 kg/m.     03/08/2022    9:52 AM 01/27/2022    9:58 AM 01/18/2021    8:30 AM 01/28/2020   11:13 AM 05/29/2019    6:32 AM 05/24/2019    9:10 AM 01/18/2019   10:07 AM  Advanced Directives  Does Patient Have a Medical Advance Directive? Yes Yes Yes Yes No Yes Yes  Type of AParamedicof AMcFallLiving will HOak CityLiving will HPinalLiving will HCrestonLiving will  H[place]woodLiving will   Does patient want to make changes to medical advance directive? No - Patient declined     No - Patient declined Yes (MAU/Ambulatory/Procedural Areas - Information given)  Copy  of [place]in Chart? Yes - validated most recent copy scanned in chart (See row information)        Would patient like information on creating a medical advance directive?     No - Patient declined      Current Medications (verified) Outpatient Encounter Medications as of 03/08/2022  Medication Sig   acetaminophen (TYLENOL) 500 MG tablet Take 500 mg by mouth every 6 (six) hours as needed for mild pain or moderate pain.   Ascorbic Acid (VITAMIN C) 1000 MG tablet Take 1,000 mg by mouth daily.   atorvastatin (LIPITOR) 10 MG tablet Take 1 tablet (10 mg total) by mouth daily.   Benfotiamine 150 MG CAPS Take 150 mg by mouth daily.   Cholecalciferol (VITAMIN D-3) 125 MCG (5000 UT) TABS Take 5,000 Units by mouth daily.   GLUCOSAMINE PO Take 1,500 mg by mouth daily.    KRILL OIL PO Take 220 mg by mouth daily.   Magnesium 400 MG TABS Take 400 mg by mouth 2 (two) times daily.   Melatonin 5 MG TABS Take 5-10 mg by mouth at bedtime.   OVER THE COUNTER MEDICATION Take 280 mg by mouth daily. [place]   SAW PALMETTO, SERENOA REPENS, PO Take 800 mg by mouth daily.    traZODone (DESYREL) 100 MG tablet TAKE HALF TO ONE TABLET BY MOUTH AT BEDTIME AS NEEDED FOR SLEEP   TURMERIC PO Take 1,200 mg by mouth daily.    fluticasone (FLONASE) 50  MCG/ACT nasal spray Place 2 sprays into both nostrils daily.   No facility-administered encounter medications on file as of 03/08/2022.    Allergies (verified) Patient has no known allergies.   History: Past Medical History:  Diagnosis Date   Allergy    Arthritis    Past Surgical History:  Procedure Laterality Date   TOTAL HIP ARTHROPLASTY Left 05/29/2019   Procedure: TOTAL HIP ARTHROPLASTY ANTERIOR APPROACH SAME DAY DISCHARGE;  Surgeon: Gaynelle Arabian, MD;  Location: [facility];  Service: Orthopedics;  Laterality: Left;  176mn   TOTAL SHOULDER ARTHROPLASTY Left 02/13/2020   Procedure: TOTAL SHOULDER ARTHROPLASTY;  Surgeon: SJustice Britain MD;   Location: [facility];  Service: Orthopedics;  Laterality: Left;  1288m   Family History  Problem Relation Age of Onset   Heart disease Mother        CHF   Stroke Father    Social History   Socioeconomic History   Marital status: Married    Spouse name: Not on file   Number of children: Not on file   Years of education: Not on file   Highest education level: Not on file  Occupational History   Not on file  Tobacco Use   Smoking status: Never   Smokeless tobacco: Never  Vaping Use   Vaping Use: Never used  Substance and Sexual Activity   Alcohol use: No   Drug use: No   Sexual activity: Yes    Birth control/protection: None    Comment: SEX PARTNERS IN THE LAST 12 MONTHS 1 AND CURRENT BIRTH CONTROL - NOT NEEDED  Other Topics Concern   Not on file  Social History Narrative   EXERCISE WALKING AND WEIGHT TRAINING 5-6 DAYS/WEEK FOR 30 MINUTES   His wife died 02/14/15/18 Social Determinants of Health   Financial Resource Strain: Low Risk  (03/08/2022)   Overall Financial Resource Strain (CARDIA)    Difficulty of Paying Living Expenses: Not hard at all  Food Insecurity: No Food Insecurity (03/08/2022)   Hunger Vital Sign    Worried About Running Out of Food in the Last Year: Never true    Ran Out of Food in the Last Year: Never true  Transportation Needs: No Transportation Needs (03/08/2022)   PRAPARE - TrHydrologistMedical): No    Lack of Transportation (Non-Medical): No  Physical Activity: Sufficiently Active (03/08/2022)   Exercise Vital Sign    Days of Exercise per Week: 6 days    Minutes of Exercise per Session: 40 min  Stress: No Stress Concern Present (03/08/2022)   [place]  Feeling of Stress : Not at all  Social Connections: Moderately Integrated (03/08/2022)   Social Connection and Isolation Panel [NHANES]    Frequency of Communication with Friends and Family: More than  three times a week    Frequency of Social Gatherings with Friends and Family: More than three times a week    Attends Religious Services: More than 4 times per year    Active Member of [employer] Organizations: No    Attends ClMusic therapistNever    Marital Status: Married    Tobacco Counseling Counseling given: Not Answered   Clinical Intake:  Pre-visit preparation completed: Yes  Pain : No/denies pain     Nutritional Risks: None Diabetes: No  How often do you need to have someone help you when you read instructions, pamphlets, or other written materials  from your doctor or pharmacy?: 1 - Never  Diabetic?no   Interpreter Needed?: No  Information entered by :: Jadene Pierini, LPN   Activities of Daily Living    03/08/2022    9:52 AM 01/27/2022    9:26 AM  In your present state of health, do you have any difficulty performing the following activities:  Hearing? 0 0  Vision? 0 0  Difficulty concentrating or making decisions? 0 0  Walking or climbing stairs? 0 0  Dressing or bathing? 0 0  Doing errands, shopping? 0 0  Preparing Food and eating ? N   Using the Toilet? N   In the past six months, have you accidently leaked urine? N   Do you have problems with loss of bowel control? N   Managing your Medications? N   Managing your Finances? N   Housekeeping or managing your Housekeeping? N     Patient Care Team: Wendie Agreste, MD as PCP - General (Family Medicine) Lavonna Monarch, MD (Inactive) as Consulting Physician (Dermatology) Justice Britain, MD as Consulting Physician (Orthopedic Surgery)  Indicate any recent Medical Services you may have received from other than [facility] providers in the past year (date may be approximate).     Assessment:   This is a routine wellness examination for [employer]  Hearing/Vision screen Vision Screening - Comments:: Annual eye exams wear glasses   Dietary issues and exercise activities discussed: Current Exercise  Habits: Home exercise routine, Type of exercise: walking, Time (Minutes): 40, Frequency (Times/Week): 5, Weekly Exercise (Minutes/Week): 200, Intensity: Mild, Exercise limited by: None identified   Goals Addressed             This Visit's Progress    DIET - INCREASE WATER INTAKE         Depression Screen    03/08/2022    9:51 AM 01/27/2022    9:09 AM 07/21/2021   10:14 AM 04/02/2021    9:36 AM 01/18/2021    8:01 AM 07/15/2020   10:45 AM 01/13/2020    9:39 AM  PHQ 2/9 Scores  PHQ - 2 Score 0 0 0 0 0 0 0  PHQ- 9 Score 0 0 2  1      Fall Risk    03/08/2022    9:49 AM 01/27/2022    9:09 AM 07/21/2021   10:14 AM 04/02/2021    9:36 AM 01/18/2021    8:00 AM  Fall Risk   Falls in the past year? 0 0 0 0 0  Number falls in past yr: 0 0 0 0 0  Injury with Fall? 0 0 0 0 0  Risk for fall due to : No Fall Risks No Fall Risks No Fall Risks No Fall Risks No Fall Risks  Follow up Falls prevention discussed Falls evaluation completed Falls evaluation completed Falls evaluation completed     FALL RISK PREVENTION PERTAINING TO THE HOME:  Any stairs in or around the home? No  If so, are there any without handrails? No  Home free of loose throw rugs in walkways, pet beds, electrical cords, etc? No  Adequate lighting in your home to reduce risk of falls? Yes   ASSISTIVE DEVICES UTILIZED TO PREVENT FALLS:  Life alert? No  Use of a cane, walker or w/c? No  Grab bars in the bathroom? Yes  Shower chair or bench in shower? No  Elevated toilet seat or a handicapped toilet? No          03/08/2022  9:52 AM 01/27/2022    9:27 AM 01/18/2021    8:40 AM 01/18/2021    8:26 AM 01/18/2019   10:05 AM  6CIT Screen  What Year? 0 points 0 points 0 points 0 points 0 points  What month? 0 points 0 points 0 points 0 points 0 points  What time? 0 points 0 points 0 points 0 points 0 points  Count back from 20 0 points 0 points 0 points 0 points 0 points  Months in reverse 0 points 0 points 0 points 0  points 0 points  Repeat phrase 0 points 0 points 2 points 4 points 0 points  Total Score 0 points 0 points 2 points 4 points 0 points    Immunizations Immunization History  Administered Date(s) Administered   Fluad Quad(high Dose 65+) 01/19/2019   Influenza, High Dose Seasonal PF 02/27/2018   Influenza,inj,Quad PF,6+ Mos 03/28/2017   Influenza,inj,quad, With Preservative 03/07/2019   Influenza-Unspecified 02/27/2018   PFIZER(Purple Top)SARS-COV-2 Vaccination 07/15/2019, 08/09/2019, 03/27/2020   Pneumococcal Conjugate-13 03/10/2017   Pneumococcal Polysaccharide-23 11/11/2019   Tdap 03/19/2013   Zoster Recombinat (Shingrix) 01/19/2019, 04/08/2019    TDAP status: Up to date  Flu Vaccine status: Due, Education has been provided regarding the importance of this vaccine. Advised may receive this vaccine at local pharmacy or Health Dept. Aware to provide a copy of the vaccination record if obtained from local pharmacy or Health Dept. Verbalized acceptance and understanding.  Pneumococcal vaccine status: Up to date  Covid-19 vaccine status: Completed vaccines  Qualifies for Shingles Vaccine? Yes   Zostavax completed No   Shingrix Completed?: No.    Education has been provided regarding the importance of this vaccine. Patient has been advised to call insurance company to determine out of pocket expense if they have not yet received this vaccine. Advised may also receive vaccine at local pharmacy or Health Dept. Verbalized acceptance and understanding.  Screening Tests Health Maintenance  Topic Date Due   COVID-19 Vaccine (4 - Pfizer risk series) 05/22/2020   INFLUENZA VACCINE  01/04/2022   TETANUS/TDAP  03/20/2023   COLONOSCOPY (Pts 45-69yr Insurance coverage will need to be confirmed)  01/29/2031   Pneumonia Vaccine 70 Years old  Completed   Hepatitis C Screening  Completed   Zoster Vaccines- Shingrix  Completed   HPV VACCINES  Aged Out    Health Maintenance  Health  Maintenance Due  Topic Date Due   COVID-19 Vaccine (4 - Pfizer risk series) 05/22/2020   INFLUENZA VACCINE  01/04/2022    Colorectal cancer screening: Type of screening: Colonoscopy. Completed 01/28/2021. Repeat every 10 years  Lung Cancer Screening: (Low Dose CT Chest recommended if Age 286-80years, 30 pack-year currently smoking OR have quit w/in 15years.) does not qualify.   Lung Cancer Screening Referral: n/a  Additional Screening:  Hepatitis C Screening: does not qualify;  Vision Screening: Recommended annual ophthalmology exams for early detection of glaucoma and other disorders of the eye. Is the patient up to date with their annual eye exam?  Yes  Who is the provider or what is the name of the office in which the patient attends annual eye exams? Dr.Miller  If pt is not established with a provider, would they like to be referred to a provider to establish care? No .   Dental Screening: Recommended annual dental exams for proper oral hygiene  Community Resource Referral / Chronic Care Management: CRR required this visit?  No   CCM required this visit?  No  Plan:     I have personally reviewed and noted the following in the patient's chart:   Medical and social history Use of alcohol, tobacco or illicit drugs  Current medications and supplements including opioid prescriptions. Patient is not currently taking opioid prescriptions. Functional ability and status Nutritional status Physical activity Advanced directives List of other physicians Hospitalizations, surgeries, and ER visits in previous 12 months Vitals Screenings to include cognitive, depression, and falls Referrals and appointments  In addition, I have reviewed and discussed with patient certain preventive protocols, quality metrics, and best practice recommendations. A written personalized care plan for preventive services as well as general preventive health recommendations were provided to  patient.     Daphane Shepherd, LPN   44/[id number]   Nurse Notes: Due Flu Vaccine

## 2022-03-08 NOTE — Telephone Encounter (Signed)
Error-request closed

## 2022-03-08 NOTE — Patient Instructions (Signed)
Rodney Bailey , Thank you for taking time to come for your Medicare Wellness Visit. I appreciate your ongoing commitment to your health goals. Please review the following plan we discussed and let me know if I can assist you in the future.   These are the goals we discussed:  Goals      DIET - INCREASE WATER INTAKE        This is a list of the screening recommended for you and due dates:  Health Maintenance  Topic Date Due   COVID-19 Vaccine (4 - Pfizer risk series) 05/22/2020   Flu Shot  01/04/2022   Tetanus Vaccine  03/20/2023   Colon Cancer Screening  01/29/2031   Pneumonia Vaccine  Completed   Hepatitis C Screening: USPSTF Recommendation to screen - Ages 18-79 yo.  Completed   Zoster (Shingles) Vaccine  Completed   HPV Vaccine  Aged Out    Advanced directives: In Chart   Conditions/risks identified:Aim for 30 minutes of exercise or brisk walking, 6-8 glasses of water, and 5 servings of fruits and vegetables each day.   Next appointment: Follow up in one year for your annual wellness visit.   Preventive Care 40 Years and Older, Male  Preventive care refers to lifestyle choices and visits with your health care provider that can promote health and wellness. What does preventive care include? A yearly physical exam. This is also called an annual well check. Dental exams once or twice a year. Routine eye exams. Ask your health care provider how often you should have your eyes checked. Personal lifestyle choices, including: Daily care of your teeth and gums. Regular physical activity. Eating a healthy diet. Avoiding tobacco and drug use. Limiting alcohol use. Practicing safe sex. Taking low doses of aspirin every day. Taking vitamin and mineral supplements as recommended by your health care provider. What happens during an annual well check? The services and screenings done by your health care provider during your annual well check will depend on your age, overall health,  lifestyle risk factors, and family history of disease. Counseling  Your health care provider may ask you questions about your: Alcohol use. Tobacco use. Drug use. Emotional well-being. Home and relationship well-being. Sexual activity. Eating habits. History of falls. Memory and ability to understand (cognition). Work and work Statistician. Screening  You may have the following tests or measurements: Height, weight, and BMI. Blood pressure. Lipid and cholesterol levels. These may be checked every 5 years, or more frequently if you are over 60 years old. Skin check. Lung cancer screening. You may have this screening every year starting at age 28 if you have a 30-pack-year history of smoking and currently smoke or have quit within the past 15 years. Fecal occult blood test (FOBT) of the stool. You may have this test every year starting at age 44. Flexible sigmoidoscopy or colonoscopy. You may have a sigmoidoscopy every 5 years or a colonoscopy every 10 years starting at age 19. Prostate cancer screening. Recommendations will vary depending on your family history and other risks. Hepatitis C blood test. Hepatitis B blood test. Sexually transmitted disease (STD) testing. Diabetes screening. This is done by checking your blood sugar (glucose) after you have not eaten for a while (fasting). You may have this done every 1-3 years. Abdominal aortic aneurysm (AAA) screening. You may need this if you are a current or former smoker. Osteoporosis. You may be screened starting at age 68 if you are at high risk. Talk with your health  care provider about your test results, treatment options, and if necessary, the need for more tests. Vaccines  Your health care provider may recommend certain vaccines, such as: Influenza vaccine. This is recommended every year. Tetanus, diphtheria, and acellular pertussis (Tdap, Td) vaccine. You may need a Td booster every 10 years. Zoster vaccine. You may need this  after age 83. Pneumococcal 13-valent conjugate (PCV13) vaccine. One dose is recommended after age 29. Pneumococcal polysaccharide (PPSV23) vaccine. One dose is recommended after age 8. Talk to your health care provider about which screenings and vaccines you need and how often you need them. This information is not intended to replace advice given to you by your health care provider. Make sure you discuss any questions you have with your health care provider. Document Released: 06/19/2015 Document Revised: 02/10/2016 Document Reviewed: 03/24/2015 Elsevier Interactive Patient Education  2017 [place] Prevention in the Home Falls can cause injuries. They can happen to people of all ages. There are many things you can do to make your home safe and to help prevent falls. What can I do on the outside of my home? Regularly fix the edges of walkways and driveways and fix any cracks. Remove anything that might make you trip as you walk through a door, such as a raised step or threshold. Trim any bushes or trees on the path to your home. Use bright outdoor lighting. Clear any walking paths of anything that might make someone trip, such as rocks or tools. Regularly check to see if handrails are loose or broken. Make sure that both sides of any steps have handrails. Any raised decks and porches should have guardrails on the edges. Have any leaves, snow, or ice cleared regularly. Use sand or salt on walking paths during winter. Clean up any spills in your garage right away. This includes oil or grease spills. What can I do in the bathroom? Use night lights. Install grab bars by the toilet and in the tub and shower. Do not use towel bars as grab bars. Use non-skid mats or decals in the tub or shower. If you need to sit down in the shower, use a plastic, non-slip stool. Keep the floor dry. Clean up any water that spills on the floor as soon as it happens. Remove soap buildup in the tub or  shower regularly. Attach bath mats securely with double-sided non-slip rug tape. Do not have throw rugs and other things on the floor that can make you trip. What can I do in the bedroom? Use night lights. Make sure that you have a light by your bed that is easy to reach. Do not use any sheets or blankets that are too big for your bed. They should not hang down onto the floor. Have a firm chair that has side arms. You can use this for support while you get dressed. Do not have throw rugs and other things on the floor that can make you trip. What can I do in the kitchen? Clean up any spills right away. Avoid walking on wet floors. Keep items that you use a lot in easy-to-reach places. If you need to reach something above you, use a strong step stool that has a grab bar. Keep electrical cords out of the way. Do not use floor polish or wax that makes floors slippery. If you must use wax, use non-skid floor wax. Do not have throw rugs and other things on the floor that can make you trip. What can  I do with my stairs? Do not leave any items on the stairs. Make sure that there are handrails on both sides of the stairs and use them. Fix handrails that are broken or loose. Make sure that handrails are as long as the stairways. Check any carpeting to make sure that it is firmly attached to the stairs. Fix any carpet that is loose or worn. Avoid having throw rugs at the top or bottom of the stairs. If you do have throw rugs, attach them to the floor with carpet tape. Make sure that you have a light switch at the top of the stairs and the bottom of the stairs. If you do not have them, ask someone to add them for you. What else can I do to help prevent falls? Wear shoes that: Do not have high heels. Have rubber bottoms. Are comfortable and fit you well. Are closed at the toe. Do not wear sandals. If you use a stepladder: Make sure that it is fully opened. Do not climb a closed stepladder. Make  sure that both sides of the stepladder are locked into place. Ask someone to hold it for you, if possible. Clearly mark and make sure that you can see: Any grab bars or handrails. First and last steps. Where the edge of each step is. Use tools that help you move around (mobility aids) if they are needed. These include: Canes. Walkers. Scooters. Crutches. Turn on the lights when you go into a dark area. Replace any light bulbs as soon as they burn out. Set up your furniture so you have a clear path. Avoid moving your furniture around. If any of your floors are uneven, fix them. If there are any pets around you, be aware of where they are. Review your medicines with your doctor. Some medicines can make you feel dizzy. This can increase your chance of falling. Ask your doctor what other things that you can do to help prevent falls. This information is not intended to replace advice given to you by your health care provider. Make sure you discuss any questions you have with your health care provider. Document Released: 03/19/2009 Document Revised: 10/29/2015 Document Reviewed: 06/27/2014 Elsevier Interactive Patient Education  2017 [employer].

## 2022-03-16 DIAGNOSIS — Z1283 Encounter for screening for malignant neoplasm of skin: Secondary | ICD-10-CM | POA: Diagnosis not present

## 2022-03-16 DIAGNOSIS — D225 Melanocytic nevi of trunk: Secondary | ICD-10-CM | POA: Diagnosis not present

## 2022-04-18 ENCOUNTER — Other Ambulatory Visit: Payer: Self-pay | Admitting: Family Medicine

## 2022-04-18 DIAGNOSIS — E785 Hyperlipidemia, unspecified: Secondary | ICD-10-CM

## 2022-07-15 ENCOUNTER — Other Ambulatory Visit: Payer: Self-pay | Admitting: Family Medicine

## 2022-07-15 DIAGNOSIS — G4700 Insomnia, unspecified: Secondary | ICD-10-CM

## 2022-07-15 NOTE — Telephone Encounter (Signed)
Patient is requesting a refill of the following medications: Requested Prescriptions   Pending Prescriptions Disp Refills   traZODone (DESYREL) 100 MG tablet [Pharmacy Med Name: traZODone 100 MG TABLET] 90 tablet 3    Sig: TAKE 1/2 TO 1 TABLET BY MOUTH AT BEDTIME AT BEDTIME AS NEEDED FOR SLEEP    Date of patient request: 07/15/22 Last office visit: 01/27/22 Date of last refill: 07/21/21 Last refill amount: 90 Follow up time period per chart: 6 months

## 2022-08-01 ENCOUNTER — Encounter: Payer: Medicare HMO | Admitting: Family Medicine

## 2022-08-18 ENCOUNTER — Encounter: Payer: Self-pay | Admitting: Family Medicine

## 2022-08-18 ENCOUNTER — Ambulatory Visit (INDEPENDENT_AMBULATORY_CARE_PROVIDER_SITE_OTHER): Payer: Medicare HMO | Admitting: Family Medicine

## 2022-08-18 VITALS — BP 124/60 | HR 62 | Temp 98.9°F | Ht 66.0 in | Wt 166.4 lb

## 2022-08-18 DIAGNOSIS — E785 Hyperlipidemia, unspecified: Secondary | ICD-10-CM

## 2022-08-18 DIAGNOSIS — R972 Elevated prostate specific antigen [PSA]: Secondary | ICD-10-CM | POA: Diagnosis not present

## 2022-08-18 DIAGNOSIS — Z125 Encounter for screening for malignant neoplasm of prostate: Secondary | ICD-10-CM

## 2022-08-18 DIAGNOSIS — G4700 Insomnia, unspecified: Secondary | ICD-10-CM

## 2022-08-18 MED ORDER — ATORVASTATIN CALCIUM 10 MG PO TABS: 10.0000 mg | ORAL_TABLET | Freq: Every day | ORAL | 2 refills | Status: DC

## 2022-08-18 NOTE — Patient Instructions (Signed)
Thanks for coming in today.  No med changes at this time.  I will let you know if there are any concerns on your labs.  Okay to continue same dose trazodone for now, be cautious with use of Benadryl in addition to trazodone but occasional doses okay.

## 2022-08-18 NOTE — Progress Notes (Signed)
Subjective:  Patient ID: Rodney Bailey., male    DOB: 1951/01/26  Age: 72 y.o. MRN: [id number]  CC:  Chief Complaint  Patient presents with   Hyperlipidemia    HPI Rodney Bailey. presents for Annual Exam - had one in 01/2022. Med follow up today.  Dermatology, Dr. Denna Haggard, now Dr. Ledell Noss audiology with sensorineural hearing loss - using hearing aids - working well.  Urology, Dr. Jeffie Pollock with BPH, elevated PSA prior after bike riding, improved on recheck.  PSA monitoring through age 31. Gastroenterology, Dr. Collene Mares, colonoscopy December 2022. Podiatry, Dr. Elby Showers [facility], Dr. Onnie Graham  Allergic rhinitis treated with Flonase as needed.working well.   Insomnia Trazodone 100 mg nightly. Working well. Occasional benadryl of needed. Min dry mouth.   Hyperlipidemia: Lipitor 10 mg daily, no new side effects/myalgias.  Lab Results  Component Value Date   CHOL 159 07/21/2021   HDL 52.90 07/21/2021   LDLCALC 96 07/21/2021   TRIG 50.0 07/21/2021   CHOLHDL 3 07/21/2021   Lab Results  Component Value Date   ALT 26 07/21/2021   AST 26 07/21/2021   ALKPHOS 66 07/21/2021   BILITOT 1.4 (H) 07/21/2021       08/18/2022    2:22 PM 03/08/2022    9:51 AM 01/27/2022    9:09 AM 07/21/2021   10:14 AM 04/02/2021    9:36 AM  Depression screen PHQ 2/9  Decreased Interest 0 0 0 0 0  Down, Depressed, Hopeless 0 0 0 0 0  PHQ - 2 Score 0 0 0 0 0  Altered sleeping 1 0 0 1   Tired, decreased energy 1 0 0 1   Change in appetite 0 0 0 0   Feeling bad or failure about yourself  0 0 0 0   Trouble concentrating 0 0 0 0   Moving slowly or fidgety/restless 0 0 0 0   Suicidal thoughts 0 0 0 0   PHQ-9 Score 2 0 0 2     Health Maintenance  Topic Date Due   COVID-19 Vaccine (4 - 2023-24 season) 02/04/2022   INFLUENZA VACCINE  09/05/2022 (Originally 01/04/2022)   Medicare Annual Wellness (AWV)  03/09/2023   DTaP/Tdap/Td (2 - Td or Tdap) 03/20/2023   COLONOSCOPY (Pts 45-46yr Insurance  coverage will need to be confirmed)  01/29/2031   Pneumonia Vaccine 72 Years old  Completed   Hepatitis C Screening  Completed   Zoster Vaccines- Shingrix  Completed   HPV VACCINES  Aged Out   Lab Results  Component Value Date   PSA1 2.7 01/18/2019   PSA1 2.6 12/05/2017   PSA 8.38 Repeated and verified X2. (H) 01/18/2021   PSA 1.60 11/03/2015   PSA 1.47 03/11/2014  No bike riding past 5 days - psa planned today.    History Patient Active Problem List   Diagnosis Date Noted   Osteoarthritis of left glenohumeral joint 10/03/2019   OA (osteoarthritis) of hip 05/29/2019   Other specified arthritis, left hip 05/29/2019   Pain of left hip joint 02/22/2019   Need for influenza vaccination 03/28/2017   Injury of toe on right foot 03/28/2017   Subungual hematoma of great toe of right foot 03/28/2017   Insomnia 11/02/2016   Nocturia 11/03/2015   Hematuria 04/26/2014   Past Medical History:  Diagnosis Date   Allergy    Arthritis    Past Surgical History:  Procedure Laterality Date   TOTAL HIP ARTHROPLASTY Left 05/29/2019   Procedure: TOTAL  HIP ARTHROPLASTY ANTERIOR APPROACH SAME DAY DISCHARGE;  Surgeon: Gaynelle Arabian, MD;  Location: [facility];  Service: Orthopedics;  Laterality: Left;  127mn   TOTAL SHOULDER ARTHROPLASTY Left 02/13/2020   Procedure: TOTAL SHOULDER ARTHROPLASTY;  Surgeon: SJustice Britain MD;  Location: [facility];  Service: Orthopedics;  Laterality: Left;  1262m   No Known Allergies Prior to Admission medications   Medication Sig Start Date End Date Taking? Authorizing Provider  Ascorbic Acid (VITAMIN C) 1000 MG tablet Take 1,000 mg by mouth daily.   Yes [provider]  atorvastatin (LIPITOR) 10 MG tablet TAKE ONE TABLET BY MOUTH DAILY 04/18/22  Yes GrWendie AgresteMD  Benfotiamine 150 MG CAPS Take 150 mg by mouth daily.   Yes [provider]  Cholecalciferol (VITAMIN D-3) 125 MCG (5000 UT) TABS Take 5,000 Units by mouth daily.   Yes [provider]  GLUCOSAMINE PO Take 1,500 mg by mouth daily.    Yes [provider]  KRILL OIL PO Take 220 mg by mouth daily.   Yes [provider]  Magnesium 400 MG TABS Take 400 mg by mouth 2 (two) times daily.   Yes [provider]  Melatonin 5 MG TABS Take 5-10 mg by mouth at bedtime.   Yes [provider]  Misc Natural Products (PROSTATE SUPPORT PO) Take by mouth. Calcium, vit d, phospherous, zinc, selenium, copper, manganese, molybdenum   Yes [provider]  OVER THE COUNTER MEDICATION Take 280 mg by mouth daily. Horsetail   Yes [provider]  SAW PALMETTO, SERENOA REPENS, PO Take 800 mg by mouth daily.    Yes [provider]  traZODone (DESYREL) 100 MG tablet TAKE 1/2 TO 1 TABLET BY MOUTH AT BEDTIME AT BEDTIME AS NEEDED FOR SLEEP 07/15/22  Yes GrWendie AgresteMD  TURMERIC PO Take 1,200 mg by mouth daily.    Yes [provider]  acetaminophen (TYLENOL) 500 MG tablet Take 500 mg by mouth every 6 (six) hours as needed for mild pain or moderate pain. Patient not taking: Reported on 08/18/2022    [provider]  fluticasone (FLONASE) 50 MCG/ACT nasal spray Place 2 sprays into both nostrils daily. 04/05/21 05/05/21  MoLynden Oxfordcales, PA-C   Social History   Socioeconomic History   Marital status: Married    Spouse name: Not on file   Number of children: Not on file   Years of education: Not on file   Highest education level: Not on file  Occupational History   Not on file  Tobacco Use   Smoking status: Never   Smokeless tobacco: Never  Vaping Use   Vaping Use: Never used  Substance and Sexual Activity   Alcohol use: No   Drug use: No   Sexual activity: Yes    Birth control/protection: None    Comment: SEX PARTNERS IN THE LAST 12 MONTHS 1 AND CURRENT BIRTH CONTROL - NOT NEEDED  Other Topics Concern   Not on file  Social History Narrative   EXERCISE WALKING AND WEIGHT TRAINING 5-6 DAYS/WEEK  FOR 30 MINUTES   His wife died 02/13/27/18 Social Determinants of Health   Financial Resource Strain: Low Risk  (03/08/2022)   Overall Financial Resource Strain (CARDIA)    Difficulty of Paying Living Expenses: Not hard at all  Food Insecurity: No Food Insecurity (03/08/2022)   Hunger Vital Sign    Worried About Running Out of Food in the Last Year: Never true  Ran Out of Food in the Last Year: Never true  Transportation Needs: No Transportation Needs (03/08/2022)   PRAPARE - Hydrologist (Medical): No    Lack of Transportation (Non-Medical): No  Physical Activity: Sufficiently Active (03/08/2022)   Exercise Vital Sign    Days of Exercise per Week: 6 days    Minutes of Exercise per Session: 40 min  Stress: No Stress Concern Present (03/08/2022)   [place]    Feeling of Stress : Not at all  Social Connections: Moderately Integrated (03/08/2022)   Social Connection and Isolation Panel [NHANES]    Frequency of Communication with Friends and Family: More than three times a week    Frequency of Social Gatherings with Friends and Family: More than three times a week    Attends Religious Services: More than 4 times per year    Active Member of [employer] or Organizations: No    Attends Archivist Meetings: Never    Marital Status: Married  Human resources officer Violence: Not At Risk (03/08/2022)   Humiliation, Afraid, Rape, and Kick questionnaire    Fear of Current or Ex-Partner: No    Emotionally Abused: No    Physically Abused: No    Sexually Abused: No    Review of Systems  Constitutional:  Negative for fatigue and unexpected weight change.  Eyes:  Negative for visual disturbance.  Respiratory:  Negative for cough, chest tightness and shortness of breath.   Cardiovascular:  Negative for chest pain, palpitations and leg swelling.  Gastrointestinal:  Negative for abdominal pain and blood  in stool.  Neurological:  Negative for dizziness, light-headedness and headaches.     Objective:   Vitals:   08/18/22 1425  BP: 124/60  Pulse: 62  Temp: 98.9 F (37.2 C)  TempSrc: Temporal  SpO2: 97%  Weight: 166 lb 6.4 oz (75.5 kg)  Height: '5\' 6"'$  (1.676 m)     Physical Exam Vitals reviewed.  Constitutional:      Appearance: He is well-developed.  HENT:     Head: Normocephalic and atraumatic.  Neck:     Vascular: No carotid bruit or JVD.  Cardiovascular:     Rate and Rhythm: Normal rate and regular rhythm.     Heart sounds: Normal heart sounds. No murmur heard. Pulmonary:     Effort: Pulmonary effort is normal.     Breath sounds: Normal breath sounds. No rales.  Musculoskeletal:     Right lower leg: No edema.     Left lower leg: No edema.  Skin:    General: Skin is warm and dry.  Neurological:     Mental Status: He is alert and oriented to person, place, and time.  Psychiatric:        Mood and Affect: Mood normal.        Assessment & Plan:  Wynn Pociask. is a 72 y.o. male . Hyperlipidemia, unspecified hyperlipidemia type - Plan: Comprehensive metabolic panel, Lipid panel, atorvastatin (LIPITOR) 10 MG tablet  -  Stable, tolerating current regimen. Medications refilled. Labs pending as above.   Screening for prostate cancer - Plan: PSA, Medicare Elevated PSA - Plan: PSA, Medicare  -Prior elevated PSA felt to be due to bike riding.  No bike riding past 5 days.  Check PSA, plan through age 4 per notes from urology.  Asymptomatic.  Insomnia, unspecified type  -Stable with trazodone, continue same.  Episodic Benadryl okay, potential additive  side effects and risks discussed.  Recheck 6 months for physical. Meds ordered this encounter  Medications   atorvastatin (LIPITOR) 10 MG tablet    Sig: Take 1 tablet (10 mg total) by mouth daily.    Dispense:  90 tablet    Refill:  2   Patient Instructions  Thanks for coming in today.  No med changes at this  time.  I will let you know if there are any concerns on your labs.  Okay to continue same dose trazodone for now, be cautious with use of Benadryl in addition to trazodone but occasional doses okay.       Signed,   Merri Ray, MD [place] 08/18/22 2:47 PM

## 2022-08-19 LAB — LIPID PANEL
Cholesterol: 158 mg/dL (ref 0–200)
HDL: 46.6 mg/dL (ref >39.00)
LDL Cholesterol: 98 mg/dL (ref 0–99)
NonHDL: 111.54
Total CHOL/HDL Ratio: 3
Triglycerides: 70 mg/dL (ref 0.0–149.0)
VLDL: 14 mg/dL (ref 0.0–40.0)

## 2022-08-19 LAB — COMPREHENSIVE METABOLIC PANEL
ALT: 26 U/L (ref 0–53)
AST: 30 U/L (ref 0–37)
Albumin: 4.3 g/dL (ref 3.5–5.2)
Alkaline Phosphatase: 53 U/L (ref 39–117)
BUN: 24 mg/dL — ABNORMAL HIGH (ref 6–23)
CO2: 27 mEq/L (ref 19–32)
Calcium: 9.7 mg/dL (ref 8.4–10.5)
Chloride: 105 mEq/L (ref 96–112)
Creatinine, Ser: 1.32 mg/dL (ref 0.40–1.50)
GFR: 54.42 mL/min — ABNORMAL LOW (ref >60.00)
Glucose, Bld: 88 mg/dL (ref 70–99)
Potassium: 4.3 mEq/L (ref 3.5–5.1)
Sodium: 140 mEq/L (ref 135–145)
Total Bilirubin: 2.5 mg/dL — ABNORMAL HIGH (ref 0.2–1.2)
Total Protein: 6.6 g/dL (ref 6.0–8.3)

## 2022-08-19 LAB — PSA, MEDICARE: PSA: 2.81 ng/ml (ref 0.10–4.00)

## 2022-09-06 ENCOUNTER — Encounter: Payer: Self-pay | Admitting: Family Medicine

## 2022-09-06 ENCOUNTER — Ambulatory Visit (INDEPENDENT_AMBULATORY_CARE_PROVIDER_SITE_OTHER): Payer: Medicare HMO | Admitting: Family Medicine

## 2022-09-06 VITALS — BP 132/82 | HR 93 | Temp 98.7°F | Ht 66.0 in | Wt 165.0 lb

## 2022-09-06 DIAGNOSIS — J3489 Other specified disorders of nose and nasal sinuses: Secondary | ICD-10-CM | POA: Diagnosis not present

## 2022-09-06 DIAGNOSIS — J0100 Acute maxillary sinusitis, unspecified: Secondary | ICD-10-CM | POA: Diagnosis not present

## 2022-09-06 DIAGNOSIS — R059 Cough, unspecified: Secondary | ICD-10-CM | POA: Diagnosis not present

## 2022-09-06 LAB — POC COVID19 BINAXNOW: SARS Coronavirus 2 Ag: NEGATIVE

## 2022-09-06 LAB — POC INFLUENZA A&B (BINAX/QUICKVUE)
Influenza A, POC: NEGATIVE
Influenza B, POC: NEGATIVE

## 2022-09-06 MED ORDER — AMOXICILLIN-POT CLAVULANATE 875-125 MG PO TABS: 1.0000 | ORAL_TABLET | Freq: Two times a day (BID) | ORAL | 0 refills | Status: DC

## 2022-09-06 NOTE — Patient Instructions (Addendum)
-  Negative rapid test for covid and flu. -START Augmentin 1 tablet twice a day (every 12 hours) for 10 days. May want to eat something when taking medication.  -Stay hydrated, drink 64-100oz of fluids, preferably water -Recommend to take Flonase daily -Alternate Tylenol 1000mg  and Ibuprofen 600-800mg  every 4 hours for pain, headache, and body aches. Recommend to eat something when taking Ibuprofen.   -Follow up if not improved.  -Already has an appointment with Dr. Carlota Raspberry on Friday to discuss about elevated bilirubin.

## 2022-09-06 NOTE — Progress Notes (Signed)
Acute Office Visit   Subjective:  Patient ID: Rodney Bednarczyk Perfecto [place]., male    DOB: 05-Jul-1950, 72 y.o.   MRN: [id number]  Chief Complaint  Patient presents with   Sinus Problem    Pt is here with C/O sinus drainage and fatigued . SX for 4 days. OTC: Mucinex and Flonase. Pt reports Covid test at home negative 09/04/2023 Pt reports his bilirubin was up last visit and Dr.Greene wanted him to return to check this on Friday, he would like to know if he can have this done today.     Sinus Problem  Patient is a 72 year old caucasian male that presents with sinus drainage, fatigue, generalized weakness, and productive cough with thick green sputum, a little less thick today, that started on Saturday. Unknown fever, but has felt warm. He reports he has took OTC Mucinex, Flonase Sinus & Cold Tylenol, and cough syrup with a little help.   Home covid test was negative on 09/04/2022.  Denies chest pain or shortness of breath; ear pain or drainage; sore throat, and headache.   ROS See HPI above    Objective:   BP 132/82   Pulse 93   Temp 98.7 F (37.1 C)   Ht 5\' 6"  (1.676 m)   Wt 165 lb (74.8 kg)   SpO2 99%   BMI 26.63 kg/m    Physical Exam Vitals reviewed.  Constitutional:      General: He is not in acute distress.    Appearance: Normal appearance. He is not ill-appearing, toxic-appearing or diaphoretic.  HENT:     Head: Normocephalic and atraumatic.     Right Ear: Tympanic membrane normal.     Left Ear: Tympanic membrane normal.     Ears:     Comments: Cerumen noted in bilateral canals    Nose: Congestion present.     Right Sinus: Maxillary sinus tenderness present. No frontal sinus tenderness.     Left Sinus: Maxillary sinus tenderness present. No frontal sinus tenderness.     Mouth/Throat:     Mouth: Mucous membranes are moist.     Pharynx: Oropharynx is clear. Uvula midline.  Eyes:     General:        Right eye: No discharge.        Left eye: No discharge.      Conjunctiva/sclera: Conjunctivae normal.  Cardiovascular:     Rate and Rhythm: Normal rate and regular rhythm.     Heart sounds: Normal heart sounds. No murmur heard.    No friction rub. No gallop.  Pulmonary:     Effort: Pulmonary effort is normal. No respiratory distress.     Breath sounds: Normal breath sounds.     Comments: No coughing during visit Musculoskeletal:        General: Normal range of motion.  Skin:    General: Skin is warm and dry.  Neurological:     General: No focal deficit present.     Mental Status: He is alert and oriented to person, place, and time. Mental status is at baseline.  Psychiatric:        Mood and Affect: Mood normal.        Behavior: Behavior normal.        Thought Content: Thought content normal.        Judgment: Judgment normal.    Results for orders placed or performed in visit on 09/06/22  POC Influenza A&B (Binax test)  Result Value Ref Range  Influenza A, POC Negative Negative   Influenza B, POC Negative Negative  POC COVID-19  Result Value Ref Range   SARS Coronavirus 2 Ag Negative Negative     Assessment & Plan:  Sinus pressure -     POC Influenza A&B(BINAX/QUICKVUE) -     POC COVID-19 BinaxNow  Acute maxillary sinusitis, recurrence not specified -     Amoxicillin-Pot Clavulanate; Take 1 tablet by mouth 2 (two) times daily.  Dispense: 20 tablet; Refill: 0  Cough, unspecified type -     Amoxicillin-Pot Clavulanate; Take 1 tablet by mouth 2 (two) times daily.  Dispense: 20 tablet; Refill: 0  -Negative rapid test for covid and flu. -START Augmentin 875/125mg , 1 tablet twice a day (every 12 hours) for 10 days. Advised that he may want to eat something when taking medication. Prescribed for acute maxillary sinusitis and for cough.  -Recommend to stay hydrated, drink 64-100oz of fluids, preferably water. -Recommend to take Flonase daily. He reports he has not been using everyday. -Recommend to alternate Tylenol 1000mg  and Ibuprofen  600-800mg  every 4 hours for pain, headache, and body aches. Recommend to eat something when taking Ibuprofen.   -Follow up if not improved.  -Already has an appointment with Dr. Carlota Raspberry on Friday to discuss about elevated bilirubin.   Valarie Merino, NP

## 2022-09-09 ENCOUNTER — Ambulatory Visit (INDEPENDENT_AMBULATORY_CARE_PROVIDER_SITE_OTHER): Payer: Medicare HMO | Admitting: Family Medicine

## 2022-09-09 ENCOUNTER — Encounter: Payer: Self-pay | Admitting: Family Medicine

## 2022-09-09 DIAGNOSIS — R3129 Other microscopic hematuria: Secondary | ICD-10-CM | POA: Diagnosis not present

## 2022-09-09 LAB — CBC
HCT: 44.6 % (ref 39.0–52.0)
Hemoglobin: 15 g/dL (ref 13.0–17.0)
MCHC: 33.7 g/dL (ref 30.0–36.0)
MCV: 93 fl (ref 78.0–100.0)
Platelets: 346 10*3/uL (ref 150.0–400.0)
RBC: 4.79 Mil/uL (ref 4.22–5.81)
RDW: 12.6 % (ref 11.5–15.5)
WBC: 5.1 10*3/uL (ref 4.0–10.5)

## 2022-09-09 LAB — URINALYSIS, MICROSCOPIC ONLY

## 2022-09-09 LAB — POCT URINALYSIS DIP (MANUAL ENTRY)
Bilirubin, UA: NEGATIVE
Glucose, UA: NEGATIVE mg/dL
Ketones, POC UA: NEGATIVE mg/dL
Leukocytes, UA: NEGATIVE
Nitrite, UA: NEGATIVE
Protein Ur, POC: NEGATIVE mg/dL
Spec Grav, UA: >=1.03 — AB (ref 1.010–1.025)
Urobilinogen, UA: 0.2 E.U./dL
pH, UA: 5.5 (ref 5.0–8.0)

## 2022-09-09 NOTE — Progress Notes (Signed)
Subjective:  Patient ID: Rodney Bailey., male    DOB: 1950-12-28  Age: 72 y.o. MRN: [id number]  CC:  Chief Complaint  Patient presents with   Medical Management of Chronic Issues    Discuss blood work    HPI Rodney Bailey Rodney Bailey. presents for  Follow-up March 14 visit.  Chronic medications reviewed at that time  Hyperbilirubinemia 2.5 on March 14, higher than previous reading of 1.4 in February of last year.  2.0 and 2.2 in 2022.  Other LFTs were normal.  No abd pain, n/v. No jaundice or scleral icterus.  No alcohol.  Rare dark urine.   Lab Results  Component Value Date   ALT 26 08/18/2022   AST 30 08/18/2022   ALKPHOS 53 08/18/2022   BILITOT 2.5 (H) 08/18/2022      History Patient Active Problem List   Diagnosis Date Noted   Osteoarthritis of left glenohumeral joint 10/03/2019   OA (osteoarthritis) of hip 05/29/2019   Other specified arthritis, left hip 05/29/2019   Pain of left hip joint 02/22/2019   Need for influenza vaccination 03/28/2017   Injury of toe on right foot 03/28/2017   Subungual hematoma of great toe of right foot 03/28/2017   Insomnia 11/02/2016   Nocturia 11/03/2015   Hematuria 04/26/2014   Past Medical History:  Diagnosis Date   Allergy    Arthritis    Past Surgical History:  Procedure Laterality Date   TOTAL HIP ARTHROPLASTY Left 05/29/2019   Procedure: TOTAL HIP ARTHROPLASTY ANTERIOR APPROACH SAME DAY DISCHARGE;  Surgeon: Ollen Gross, MD;  Location: [facility];  Service: Orthopedics;  Laterality: Left;    TOTAL SHOULDER ARTHROPLASTY Left 02/13/2020   Procedure: TOTAL SHOULDER ARTHROPLASTY;  Surgeon: Francena Hanly, MD;  Location: [facility];  Service: Orthopedics;  Laterality: Left;    No Known Allergies Prior to Admission medications   Medication Sig Start Date End Date Taking? Authorizing Provider  acetaminophen (TYLENOL) 500 MG tablet Take 500 mg by mouth every 6 (six) hours as needed for mild pain or moderate pain.   Yes  [provider]  amoxicillin-clavulanate (AUGMENTIN) 875-125 MG tablet Take 1 tablet by mouth 2 (two) times daily. 09/06/22  Yes Alveria Apley, NP  Ascorbic Acid (VITAMIN C) 1000 MG tablet Take 1,000 mg by mouth daily.   Yes [provider]  atorvastatin (LIPITOR) 10 MG tablet Take 1 tablet (10 mg total) by mouth daily. 08/18/22  Yes Shade Flood, MD  Benfotiamine 150 MG CAPS Take 150 mg by mouth daily.   Yes [provider]  Cholecalciferol (VITAMIN D-3) 125 MCG (5000 UT) TABS Take 5,000 Units by mouth daily.   Yes [provider]  GLUCOSAMINE PO Take 1,500 mg by mouth daily.    Yes [provider]  KRILL OIL PO Take 220 mg by mouth daily.   Yes [provider]  Magnesium 400 MG TABS Take 400 mg by mouth 2 (two) times daily.   Yes [provider]  Melatonin 5 MG TABS Take 5-10 mg by mouth at bedtime.   Yes [provider]  Misc Natural Products (PROSTATE SUPPORT PO) Take by mouth. Calcium, vit d, phospherous, zinc, selenium, copper, manganese, molybdenum   Yes [provider]  OVER THE COUNTER MEDICATION Take 280 mg by mouth daily. Horsetail   Yes [provider]  SAW PALMETTO, SERENOA REPENS, PO Take 800 mg by mouth daily.    Yes [provider]  traZODone (  DESYREL) 100 MG tablet TAKE 1/2 TO 1 TABLET BY MOUTH AT BEDTIME AT BEDTIME AS NEEDED FOR SLEEP 07/15/22  Yes Shade FloodGreene, Halyn Flaugher R, MD  TURMERIC PO Take 1,200 mg by mouth daily.    Yes [provider]  fluticasone (FLONASE) 50 MCG/ACT nasal spray Place 2 sprays into both nostrils daily. 04/05/21 05/05/21  Theadora RamaMorgan, Lindsay Scales, PA-C   Social History   Socioeconomic History   Marital status: Married    Spouse name: Not on file   Number of children: Not on file   Years of education: Not on file   Highest education level: Not on file  Occupational History   Not on file  Tobacco Use   Smoking status: Never   Smokeless tobacco:  Never  Vaping Use   Vaping Use: Never used  Substance and Sexual Activity   Alcohol use: No   Drug use: No   Sexual activity: Yes    Birth control/protection: None    Comment: SEX PARTNERS IN THE LAST 12 MONTHS 1 AND CURRENT BIRTH CONTROL - NOT NEEDED  Other Topics Concern   Not on file  Social History Narrative   EXERCISE WALKING AND WEIGHT TRAINING 5-6 DAYS/WEEK FOR 30 MINUTES   His wife died 03/01/2017   Social Determinants of Health   Financial Resource Strain: Low Risk  (03/08/2022)   Overall Financial Resource Strain (CARDIA)    Difficulty of Paying Living Expenses: Not hard at all  Food Insecurity: No Food Insecurity (03/08/2022)   Hunger Vital Sign    Worried About Running Out of Food in the Last Year: Never true    Ran Out of Food in the Last Year: Never true  Transportation Needs: No Transportation Needs (03/08/2022)   PRAPARE - Administrator, Civil ServiceTransportation    Lack of Transportation (Medical): No    Lack of Transportation (Non-Medical): No  Physical Activity: Sufficiently Active (03/08/2022)   Exercise Vital Sign    Days of Exercise per Week: 6 days    Minutes of Exercise per Session: 40 min  Stress: No Stress Concern Present (03/08/2022)   [employer] - Occupational Stress Questionnaire    Feeling of Stress : Not at all  Social Connections: Moderately Integrated (03/08/2022)   Social Connection and Isolation Panel [NHANES]    Frequency of Communication with Friends and Family: More than three times a week    Frequency of Social Gatherings with Friends and Family: More than three times a week    Attends Religious Services: More than 4 times per year    Active Member of [employer] or Organizations: No    Attends BankerClub or Organization Meetings: Never    Marital Status: Married  Catering managerntimate Partner Violence: Not At Risk (03/08/2022)   Humiliation, Afraid, Rape, and Kick questionnaire    Fear of Current or Ex-Partner: No    Emotionally Abused: No    Physically  Abused: No    Sexually Abused: No    Review of Systems Per hpi.   Objective:   Vitals:   09/09/22 0939  BP: 122/70  Pulse: 85  Temp: 98.6 F (37 C)  TempSrc: Temporal  SpO2: 98%  Weight: 164 lb 12.8 oz (74.8 kg)  Height: 5\' 6"  (1.676 m)     Physical Exam Vitals reviewed.  Constitutional:      Appearance: He is well-developed.  HENT:     Head: Normocephalic and atraumatic.  Eyes:     General: No scleral icterus. Neck:     Vascular: No  carotid bruit or JVD.  Cardiovascular:     Rate and Rhythm: Normal rate and regular rhythm.     Heart sounds: Normal heart sounds. No murmur heard. Pulmonary:     Effort: Pulmonary effort is normal.     Breath sounds: Normal breath sounds. No rales.  Abdominal:     General: Abdomen is flat. There is no distension.     Tenderness: There is no abdominal tenderness. There is no guarding.  Musculoskeletal:     Right lower leg: No edema.     Left lower leg: No edema.  Skin:    General: Skin is warm and dry.     Coloration: Skin is not jaundiced.  Neurological:     Mental Status: He is alert and oriented to person, place, and time.  Psychiatric:        Mood and Affect: Mood normal.     Results for orders placed or performed in visit on 09/09/22  POCT urinalysis dipstick  Result Value Ref Range   Color, UA yellow yellow   Clarity, UA clear clear   Glucose, UA negative negative mg/dL   Bilirubin, UA negative negative   Ketones, POC UA negative negative mg/dL   Spec Grav, UA >=1.191>=1.030 (A) 1.010 - 1.025   Blood, UA trace-intact (A) negative   pH, UA 5.5 5.0 - 8.0   Protein Ur, POC negative negative mg/dL   Urobilinogen, UA 0.2 0.2 or 1.0 E.U./dL   Nitrite, UA Negative Negative   Leukocytes, UA Negative Negative      Assessment & Plan:  Rodney ArvinLloyd D Atwater Jr. is a 72 y.o. male . Hyperbilirubinemia - Plan: POCT urinalysis dipstick, Bilirubin, neonatal (fractionated - tot/dir/indir), Reticulocytes, Pathologist smear review,  CBC Asymptomatic, slight elevated bilirubin from prior baseline.  Suspect Gilbert's syndrome.  Check CBC, reticulocyte count count, urinalysis with episodic dark urine to use check for significant bilirubinuria, pathologist review, and if reassuring labs would call this Gilbert's syndrome and continue monitoring of levels over the next 12 to 18 months.  If stable, no further workup.  RTC precautions if new symptoms.  Urine negative for bilirubin but trace of blood noted on dipstick, check micro to verify if true hematuria.  History of BPH, elevated PSA prior, urology eval October 2022, no hematuria at that time.  No orders of the defined types were placed in this encounter.  Patient Instructions  Bilirubin has been elevated in the past, just a little bit higher recently.  I am not concerned with that lab and  I suspect this is stable for you   As we discussed this could be Gilbert's syndrome.  There is no need for treatment for that syndrome and we will just keep an eye on your liver test, blood counts and bilirubin for any significant changes.  I will check some other labs today that will help us make that diagnosis.  Let me know if there are questions.  Keep follow-up as planned otherwise.  Take care and thank you for coming in today.    Signed,   Meredith StaggersJeffrey Viet Kemmerer, MD [facility] 09/09/22 10:25 AM

## 2022-09-09 NOTE — Patient Instructions (Signed)
Bilirubin has been elevated in the past, just a little bit higher recently.  I am not concerned with that lab and  I suspect this is stable for you   As we discussed this could be Gilbert's syndrome.  There is no need for treatment for that syndrome and we will just keep an eye on your liver test, blood counts and bilirubin for any significant changes.  I will check some other labs today that will help [place] make that diagnosis.  Let me know if there are questions.  Keep follow-up as planned otherwise.  Take care and thank you for coming in today.

## 2022-09-10 LAB — BILIRUBIN, FRACTIONATED(TOT/DIR/INDIR)
Bilirubin, Direct: 0.2 mg/dL (ref 0.0–0.2)
Total Bilirubin: 0.8 mg/dL (ref 0.2–1.2)

## 2022-09-12 LAB — BILIRUBIN, FRACTIONATED(TOT/DIR/INDIR): Indirect Bilirubin: 0.6 mg/dL (calc) (ref 0.2–1.2)

## 2022-09-12 LAB — PATHOLOGIST SMEAR REVIEW

## 2022-10-11 DIAGNOSIS — E785 Hyperlipidemia, unspecified: Secondary | ICD-10-CM | POA: Diagnosis not present

## 2022-10-11 DIAGNOSIS — Z809 Family history of malignant neoplasm, unspecified: Secondary | ICD-10-CM | POA: Diagnosis not present

## 2022-10-11 DIAGNOSIS — Z8249 Family history of ischemic heart disease and other diseases of the circulatory system: Secondary | ICD-10-CM | POA: Diagnosis not present

## 2022-10-11 DIAGNOSIS — R030 Elevated blood-pressure reading, without diagnosis of hypertension: Secondary | ICD-10-CM | POA: Diagnosis not present

## 2022-10-11 DIAGNOSIS — M1990 Unspecified osteoarthritis, unspecified site: Secondary | ICD-10-CM | POA: Diagnosis not present

## 2022-10-11 DIAGNOSIS — G4700 Insomnia, unspecified: Secondary | ICD-10-CM | POA: Diagnosis not present

## 2022-10-11 DIAGNOSIS — J309 Allergic rhinitis, unspecified: Secondary | ICD-10-CM | POA: Diagnosis not present

## 2022-10-11 DIAGNOSIS — Z833 Family history of diabetes mellitus: Secondary | ICD-10-CM | POA: Diagnosis not present

## 2022-10-11 DIAGNOSIS — Z823 Family history of stroke: Secondary | ICD-10-CM | POA: Diagnosis not present

## 2022-10-11 DIAGNOSIS — Z96619 Presence of unspecified artificial shoulder joint: Secondary | ICD-10-CM | POA: Diagnosis not present

## 2022-10-11 DIAGNOSIS — Z96649 Presence of unspecified artificial hip joint: Secondary | ICD-10-CM | POA: Diagnosis not present

## 2022-11-07 DIAGNOSIS — H5203 Hypermetropia, bilateral: Secondary | ICD-10-CM | POA: Diagnosis not present

## 2022-11-25 ENCOUNTER — Telehealth (INDEPENDENT_AMBULATORY_CARE_PROVIDER_SITE_OTHER): Payer: Medicare HMO | Admitting: Family Medicine

## 2022-11-25 VITALS — BP 118/73 | Temp 100.0°F | Ht 66.0 in

## 2022-11-25 DIAGNOSIS — U071 COVID-19: Secondary | ICD-10-CM | POA: Diagnosis not present

## 2022-11-25 MED ORDER — NIRMATRELVIR/RITONAVIR (PAXLOVID) TABLET (RENAL DOSING)
2.0000 | ORAL_TABLET | Freq: Two times a day (BID) | ORAL | 0 refills | Status: AC
Start: 2022-11-25 — End: 2022-11-30

## 2022-11-25 NOTE — Progress Notes (Addendum)
Virtual Visit via Video Note  I connected with Rodney Bailey. on 11/25/22 at 9:37am by a video enabled telemedicine application and verified that I am speaking with the correct person using two identifiers.   Patient Location: Home Provider Location: office - [facility].    I discussed the limitations, risks, security and privacy concerns of performing an evaluation and management service by telephone and the availability of in person appointments. I also discussed with the patient that there may be a patient responsible charge related to this service. The patient expressed understanding and agreed to proceed, consent obtained  Chief Complaint  Patient presents with   Covid Positive    Sx cough, fever 100.0,fatigued. Pt tested positive this morning    History of Present Illness: Rodney Bailey. is a 72 y.o. male that tested positive for covid at home. Symptoms started last night. He reports he just returned from [place].  +Non productive cough +Fever (100.0 forehead) +Nasal congestion and drainage +Occasional sore throat  +Fatigue  -Chest Pain  -[facility] -Ear pain or drainage  Patient Active Problem List   Diagnosis Date Noted   Osteoarthritis of left glenohumeral joint 10/03/2019   OA (osteoarthritis) of hip 05/29/2019   Other specified arthritis, left hip 05/29/2019   Pain of left hip joint 02/22/2019   Need for influenza vaccination 03/28/2017   Injury of toe on right foot 03/28/2017   Subungual hematoma of great toe of right foot 03/28/2017   Insomnia 11/02/2016   Nocturia 11/03/2015   Hematuria 04/26/2014   Past Medical History:  Diagnosis Date   Allergy    Arthritis    Past Surgical History:  Procedure Laterality Date   TOTAL HIP ARTHROPLASTY Left 05/29/2019   Procedure: TOTAL HIP ARTHROPLASTY ANTERIOR APPROACH SAME DAY DISCHARGE;  Surgeon: Ollen Gross, MD;  Location: [facility];  Service: Orthopedics;  Laterality: Left;    TOTAL SHOULDER  ARTHROPLASTY Left 02/13/2020   Procedure: TOTAL SHOULDER ARTHROPLASTY;  Surgeon: Francena Hanly, MD;  Location: [facility];  Service: Orthopedics;  Laterality: Left;    No Known Allergies Prior to Admission medications   Medication Sig Start Date End Date Taking? Authorizing Provider  acetaminophen (TYLENOL) 500 MG tablet Take 500 mg by mouth every 6 (six) hours as needed for mild pain or moderate pain.   Yes [provider]  Ascorbic Acid (VITAMIN C) 1000 MG tablet Take 1,000 mg by mouth daily.   Yes [provider]  atorvastatin (LIPITOR) 10 MG tablet Take 1 tablet (10 mg total) by mouth daily. 08/18/22  Yes Shade Flood, MD  Benfotiamine 150 MG CAPS Take 150 mg by mouth daily.   Yes [provider]  Cholecalciferol (VITAMIN D-3) 125 MCG (5000 UT) TABS Take 5,000 Units by mouth daily.   Yes [provider]  GLUCOSAMINE PO Take 1,500 mg by mouth daily.    Yes [provider]  KRILL OIL PO Take 220 mg by mouth daily.   Yes [provider]  Magnesium 400 MG TABS Take 400 mg by mouth 2 (two) times daily.   Yes [provider]  Melatonin 5 MG TABS Take 5-10 mg by mouth at bedtime.   Yes [provider]  Misc Natural Products (PROSTATE SUPPORT PO) Take by mouth. Calcium, vit d, phospherous, zinc, selenium, copper, manganese, molybdenum   Yes [provider]  OVER THE COUNTER MEDICATION Take 280 mg by mouth daily. Horsetail   Yes [provider]  SAW  PALMETTO, SERENOA REPENS, PO Take 800 mg by mouth daily.    Yes [provider]  traZODone (DESYREL) 100 MG tablet TAKE 1/2 TO 1 TABLET BY MOUTH AT BEDTIME AT BEDTIME AS NEEDED FOR SLEEP 07/15/22  Yes Shade Flood, MD  TURMERIC PO Take 1,200 mg by mouth daily.    Yes [provider]  amoxicillin-clavulanate (AUGMENTIN) 875-125 MG tablet Take 1 tablet by mouth 2 (two) times daily. Patient not taking: Reported on 11/25/2022 09/06/22   Alveria Apley, NP  fluticasone [facility]) 50 MCG/ACT nasal spray Place 2 sprays into both nostrils daily. 04/05/21 05/05/21  Theadora Rama Scales, PA-C   Social History   Socioeconomic History   Marital status: Married    Spouse name: Not on file   Number of children: Not on file   Years of education: Not on file   Highest education level: Not on file  Occupational History   Not on file  Tobacco Use   Smoking status: Never   Smokeless tobacco: Never  Vaping Use   Vaping Use: Never used  Substance and Sexual Activity   Alcohol use: No   Drug use: No   Sexual activity: Yes    Birth control/protection: None    Comment: SEX PARTNERS IN THE LAST 12 MONTHS 1 AND CURRENT BIRTH CONTROL - NOT NEEDED  Other Topics Concern   Not on file  Social History Narrative   EXERCISE WALKING AND WEIGHT TRAINING 5-6 DAYS/WEEK FOR 30 MINUTES   His wife died Mar 26, 2017   Social Determinants of Health   Financial Resource Strain: Low Risk  (03/08/2022)   Overall Financial Resource Strain (CARDIA)    Difficulty of Paying Living Expenses: Not hard at all  Food Insecurity: No Food Insecurity (03/08/2022)   Hunger Vital Sign    Worried About Running Out of Food in the Last Year: Never true    Ran Out of Food in the Last Year: Never true  Transportation Needs: No Transportation Needs (03/08/2022)   PRAPARE - Administrator, Civil Service (Medical): No    Lack of Transportation (Non-Medical): No  Physical Activity: Sufficiently Active (03/08/2022)   Exercise Vital Sign    Days of Exercise per Week: 6 days    Minutes of Exercise per Session: 40 min  Stress: No Stress Concern Present (03/08/2022)   [employer] of Occupational Health - Occupational Stress Questionnaire    Feeling of Stress : Not at all  Social Connections: Moderately Integrated (03/08/2022)   Social Connection and Isolation Panel [NHANES]    Frequency of Communication with Friends and Family: More than three times a week     Frequency of Social Gatherings with Friends and Family: More than three times a week    Attends Religious Services: More than 4 times per year    Active Member of [employer] or Organizations: No    Attends Banker Meetings: Never    Marital Status: Married  Catering manager Violence: Not At Risk (03/08/2022)   Humiliation, Afraid, Rape, and Kick questionnaire    Fear of Current or Ex-Partner: No    Emotionally Abused: No    Physically Abused: No    Sexually Abused: No    Observations/Objective: Today's Vitals   11/25/22 0917  BP: 118/73  Temp: 100 F (37.8 C)  SpO2: 93%  Height: 5\' 6"  (1.676 m)   Physical Exam Vitals reviewed.  Constitutional:      General: He is not in acute distress.  Appearance: Normal appearance. He is not ill-appearing, toxic-appearing or diaphoretic.  HENT:     Head: Normocephalic and atraumatic.  Eyes:     General:        Right eye: No discharge.        Left eye: No discharge.     Conjunctiva/sclera: Conjunctivae normal.  Pulmonary:     Effort: Pulmonary effort is normal. No respiratory distress.  Skin:    General: Skin is dry.  Neurological:     General: No focal deficit present.     Mental Status: He is alert and oriented to person, place, and time. Mental status is at baseline.  Psychiatric:        Mood and Affect: Mood normal.        Behavior: Behavior normal.        Thought Content: Thought content normal.        Judgment: Judgment normal.     Assessment and Plan: COVID -     nirmatrelvir/ritonavir (renal dosing); Take 2 tablets by mouth 2 (two) times daily for 5 days. (Take nirmatrelvir 150 mg one tablet twice daily for 5 days and ritonavir 100 mg one tablet twice daily for 5 days) Patient GFR is 54.42.  Dispense: 20 tablet; Refill: 0  1.Prescribed Paxlovid renal dose patients GFR was 54.42, below 60. Advised patient to not take Lipitor for 2 weeks due to concerns of liver complications while taking Paxlovid.  2.Discussed  supportive care and red flags that should prompt going to the emergency department.  3. Recommend to take Tylenol 1000mg  and Ibuprofen 600-800mg  every 4 hours for pain, fever, and body aches. 4.Patient expressed understanding and is in agreement with plan.   Follow Up Instructions: As needed or not improved.    I discussed the assessment and treatment plan with the patient. The patient was provided an opportunity to ask questions and all were answered. The patient agreed with the plan and demonstrated an understanding of the instructions.   The patient was advised to call back or seek an in-person evaluation if the symptoms worsen or if the condition fails to improve as anticipated.  Zandra Abts, NP

## 2023-01-11 ENCOUNTER — Ambulatory Visit (INDEPENDENT_AMBULATORY_CARE_PROVIDER_SITE_OTHER): Payer: Medicare HMO | Admitting: Family Medicine

## 2023-01-11 ENCOUNTER — Encounter: Payer: Self-pay | Admitting: Family Medicine

## 2023-01-11 VITALS — BP 122/60 | HR 64 | Temp 97.9°F | Ht 66.0 in | Wt 165.8 lb

## 2023-01-11 DIAGNOSIS — K4020 Bilateral inguinal hernia, without obstruction or gangrene, not specified as recurrent: Secondary | ICD-10-CM | POA: Diagnosis not present

## 2023-01-11 NOTE — Patient Instructions (Signed)
It does appear that you have a hernia on both the right and left side.  See information below.  Continue to avoid heavy lifting, straining for now.  I will refer you to general surgery to discuss treatment options.  If any increased swelling, redness or acute worsening pain in that area be seen, especially if you are unable to push it back in.  Let me know if there are questions.  Hernia, Adult     A hernia happens when an organ or tissue inside your body pushes out through a weak spot in the muscles of your belly. This makes a bulge. The bulge may be: In a scar from surgery. This type of bulge is called an incisional hernia. Near your belly button. This type is called an umbilical hernia. In your groin, which is the area where your leg meets your lower belly. This type is called an inguinal hernia. If you're male, this type could also be in your scrotum. In your upper thigh. This type is called a femoral hernia. Inside your belly. This type is called a hiatal hernia. It happens when your stomach slides above your diaphragm, which is the muscle between your belly and your chest. What are the causes? You may get a hernia if: You lift heavy things. You cough over a long period of time. You have trouble pooping, also called constipation. Trouble pooping can lead to straining. There's a cut from surgery in your belly. You have a problem that's present at birth. There's fluid around your belly. You're male and have a testicle that hasn't moved down into your scrotum. You may be at greater risk for hernia if: You smoke. You're very overweight. What are the signs or symptoms? The main symptom is a bulge, but the bulge may not always be seen. It may grow bigger or be easier to see when you cough or strain, such as when you lift something heavy. If you can push the bulge back into your belly, it may not cause pain. If you can't push it back into your belly, it may lose its blood supply. This can  cause: Pain. Fever. Nausea and vomiting. Swelling. Trouble pooping. How is this diagnosed? A hernia may be diagnosed based on your symptoms, medical history, and an exam. Your health care provider may ask you to cough or move in a way that makes the bulge easier to see. You may also have tests done. These may include: X-rays. Ultrasound. CT scan. How is this treated? A hernia that's small and painless may not need to be treated. A hernia that's large or painful may be treated with surgery. Surgery involves pushing the bulge back into place and fixing the weak area of the muscle or belly. Follow these instructions at home: Activity Try not to strain. You may have to avoid lifting. Ask how much weight you can safely lift. If you lift something heavy, use your leg muscles. Do not use your back muscles to lift. Prevent trouble pooping You may need to take these actions to prevent or treat trouble pooping: Drink enough fluid to keep your pee (urine) pale yellow. Take medicines to help you poop. Eat foods high in fiber. These include beans, whole grains, and fresh fruits and vegetables. General instructions When you cough, try to cough gently. Try to push the bulge back in by very gently pressing on it when you're lying down. Do not try to force it back in if it won't push in easily. If  you're overweight, work with your provider to lose weight safely. Do not smoke, vape, or use products with nicotine or tobacco in them. If you need help quitting, talk with your provider. If you're going to have surgery, watch your hernia for changes in shape, size, or color. Tell your provider about any changes. Contact a health care provider if: You get new pain, swelling, or redness near your hernia. You have trouble pooping. Your poop is harder or larger than normal. You have a fever or chills. You have nausea or vomiting. Your hernia can't be pushed in. Get help right away if: You have belly pain  that gets worse. Your hernia: Changes in shape or size. Changes color. Feels hard or hurts when you touch it. These symptoms may be an emergency. Call 911 right away. Do not wait to see if the symptoms will go away. Do not drive yourself to the hospital. This information is not intended to replace advice given to you by your health care provider. Make sure you discuss any questions you have with your health care provider. Document Revised: 08/16/2022 Document Reviewed: 08/16/2022 Elsevier Patient Education  2024 [employer].

## 2023-01-11 NOTE — Progress Notes (Signed)
Subjective:  Patient ID: Rodney Arvin., male    DOB: 03/25/1951  Age: 72 y.o. MRN: [id number]  CC:  Chief Complaint  Patient presents with   Hernia    Pt notes some drooping in the area of concern, little pain, bilateral groin area, pt does recumbent bike for exercise didn't know if this may cause this noticed apx 2 months ago     HPI Rodney Bailey [employer]. presents for   Possible hernia: Noticed few months ago on recumbent bike. Felt soreness and then drooping of area. Both sides of groin.  Noticed after digging some holes in ground.  No bowel changes, no skin changes. No current pain. Minimal twinge of soreness. Avoiding bike riding or heavy lifting. Walking for exercise.  No prior hernia known.   History Patient Active Problem List   Diagnosis Date Noted   Osteoarthritis of left glenohumeral joint 10/03/2019   OA (osteoarthritis) of hip 05/29/2019   Other specified arthritis, left hip 05/29/2019   Pain of left hip joint 02/22/2019   Need for influenza vaccination 03/28/2017   Injury of toe on right foot 03/28/2017   Subungual hematoma of great toe of right foot 03/28/2017   Insomnia 11/02/2016   Nocturia 11/03/2015   Hematuria 04/26/2014   Past Medical History:  Diagnosis Date   Allergy    Arthritis    Past Surgical History:  Procedure Laterality Date   TOTAL HIP ARTHROPLASTY Left 05/29/2019   Procedure: TOTAL HIP ARTHROPLASTY ANTERIOR APPROACH SAME DAY DISCHARGE;  Surgeon: Ollen Gross, MD;  Location: [facility];  Service: Orthopedics;  Laterality: Left;    TOTAL SHOULDER ARTHROPLASTY Left 02/13/2020   Procedure: TOTAL SHOULDER ARTHROPLASTY;  Surgeon: Francena Hanly, MD;  Location: [facility];  Service: Orthopedics;  Laterality: Left;    No Known Allergies Prior to Admission medications   Medication Sig Start Date End Date Taking? Authorizing Provider  acetaminophen (TYLENOL) 500 MG tablet Take 500 mg by mouth every 6 (six) hours as needed for mild pain or  moderate pain.   Yes [provider]  Ascorbic Acid (VITAMIN C) 1000 MG tablet Take 1,000 mg by mouth daily.   Yes [provider]  atorvastatin (LIPITOR) 10 MG tablet Take 1 tablet (10 mg total) by mouth daily. 08/18/22  Yes Shade Flood, MD  Benfotiamine 150 MG CAPS Take 150 mg by mouth daily.   Yes [provider]  Cholecalciferol (VITAMIN D-3) 125 MCG (5000 UT) TABS Take 5,000 Units by mouth daily.   Yes [provider]  GLUCOSAMINE PO Take 1,500 mg by mouth daily.    Yes [provider]  Magnesium 400 MG TABS Take 400 mg by mouth 2 (two) times daily.   Yes [provider]  Melatonin 5 MG TABS Take 5-10 mg by mouth at bedtime.   Yes [provider]  Misc Natural Products (PROSTATE SUPPORT PO) Take by mouth. Calcium, vit d, phospherous, zinc, selenium, copper, manganese, molybdenum   Yes [provider]  OVER THE COUNTER MEDICATION Take 280 mg by mouth daily. [place]orsetail   Yes [provider]  SAW PALMETTO, SERENOA REPENS, PO Take 800 mg by mouth daily.    Yes [provider]  traZODone (DESYREL) 100 MG tablet TAKE 1/2 TO 1 TABLET BY MOUTH AT BEDTIME AT BEDTIME AS NEEDED FOR SLEEP 07/15/22  Yes Shade Flood, MD  TURMERIC PO Take 1,200 mg by mouth daily.    Yes [provider]  fluticasone (FLONASE) 50 MCG/ACT nasal spray Place 2 sprays into both nostrils daily. 04/05/21 05/05/21  Theadora Rama Scales, PA-C   Social History   Socioeconomic History   Marital status: Married    Spouse name: Not on file   Number of children: Not on file   Years of education: Not on file   Highest education level: Not on file  Occupational History   Not on file  Tobacco Use   Smoking status: Never   Smokeless tobacco: Never  Vaping Use   Vaping status: Never Used  Substance and Sexual Activity   Alcohol use: No   Drug use: No   Sexual activity: Yes    Birth control/protection: None    Comment:  [employer] IN THE LAST 12 MONTHS 1 AND CURRENT BIRTH CONTROL - NOT NEEDED  Other Topics Concern   Not on file  Social History Narrative   EXERCISE WALKING AND WEIGHT TRAINING 5-6 DAYS/WEEK FOR 30 MINUTES   His wife died 10-Mar-2017   Social Determinants of Health   Financial Resource Strain: Low Risk  (03/08/2022)   Overall Financial Resource Strain (CARDIA)    Difficulty of Paying Living Expenses: Not hard at all  Food Insecurity: No Food Insecurity (03/08/2022)   Hunger Vital Sign    Worried About Running Out of Food in the Last Year: Never true    Ran Out of Food in the Last Year: Never true  Transportation Needs: No Transportation Needs (03/08/2022)   PRAPARE - Administrator, Civil Service (Medical): No    Lack of Transportation (Non-Medical): No  Physical Activity: Sufficiently Active (03/08/2022)   Exercise Vital Sign    Days of Exercise per Week: 6 days    Minutes of Exercise per Session: 40 min  Stress: No Stress Concern Present (03/08/2022)   [employer] of Occupational Health - Occupational Stress Questionnaire    Feeling of Stress : Not at all  Social Connections: Moderately Integrated (03/08/2022)   Social Connection and Isolation Panel [NHANES]    Frequency of Communication with Friends and Family: More than three times a week    Frequency of Social Gatherings with Friends and Family: More than three times a week    Attends Religious Services: More than 4 times per year    Active Member of [employer] or Organizations: No    Attends Banker Meetings: Never    Marital Status: Married  Catering manager Violence: Not At Risk (03/08/2022)   Humiliation, Afraid, Rape, and Kick questionnaire    Fear of Current or Ex-Partner: No    Emotionally Abused: No    Physically Abused: No    Sexually Abused: No    Review of Systems Per HPI  Objective:   Vitals:   01/11/23 1413  BP: 122/60  Pulse: 64  Temp: 97.9 F (36.6 C)  TempSrc: Temporal  SpO2:  97%  Weight: 165 lb 12.8 oz (75.2 kg)  Height: 5\' 6"  (1.676 m)     Physical Exam Vitals reviewed.  Constitutional:      General: He is not in acute distress.    Appearance: Normal appearance. He is well-developed.  HENT:     Head: Normocephalic and atraumatic.  Cardiovascular:     Rate and Rhythm: Normal rate.  Pulmonary:     Effort: Pulmonary effort is normal.  Abdominal:     General: Abdomen is flat. Bowel sounds are normal.     Hernia: A hernia (Bilateral inguinal hernia, large on right  versus left but both easily reducible, nontender.) is present.    Skin:    General: Skin is warm and dry.     Findings: No erythema or rash.  Neurological:     Mental Status: He is alert and oriented to person, place, and time.  Psychiatric:        Mood and Affect: Mood normal.        Assessment & Plan:  Bastian Balsbaugh. is a 72 y.o. male . Bilateral inguinal hernia without obstruction or gangrene, recurrence not specified - Plan: Ambulatory referral to General Surgery Bilateral inguinal hernias, right greater than left, but nontender, no concerning features or signs of incarcerated or strangulated hernia but those precautions and potential symptoms, ER precautions were given.  Continue to avoid heavy lifting, straining, handout given, and refer to general surgery to discuss repair.  No orders of the defined types were placed in this encounter.  Patient Instructions  It does appear that you have a hernia on both the right and left side.  See information below.  Continue to avoid heavy lifting, straining for now.  I will refer you to general surgery to discuss treatment options.  If any increased swelling, redness or acute worsening pain in that area be seen, especially if you are unable to push it back in.  Let me know if there are questions.  Hernia, Adult     A hernia happens when an organ or tissue inside your body pushes out through a weak spot in the muscles of your belly.  This makes a bulge. The bulge may be: In a scar from surgery. This type of bulge is called an incisional hernia. Near your belly button. This type is called an umbilical hernia. In your groin, which is the area where your leg meets your lower belly. This type is called an inguinal hernia. If you're male, this type could also be in your scrotum. In your upper thigh. This type is called a femoral hernia. Inside your belly. This type is called a hiatal hernia. It happens when your stomach slides above your diaphragm, which is the muscle between your belly and your chest. What are the causes? You may get a hernia if: You lift heavy things. You cough over a long period of time. You have trouble pooping, also called constipation. Trouble pooping can lead to straining. There's a cut from surgery in your belly. You have a problem that's present at birth. There's fluid around your belly. You're male and have a testicle that hasn't moved down into your scrotum. You may be at greater risk for hernia if: You smoke. You're very overweight. What are the signs or symptoms? The main symptom is a bulge, but the bulge may not always be seen. It may grow bigger or be easier to see when you cough or strain, such as when you lift something heavy. If you can push the bulge back into your belly, it may not cause pain. If you can't push it back into your belly, it may lose its blood supply. This can cause: Pain. Fever. Nausea and vomiting. Swelling. Trouble pooping. How is this diagnosed? A hernia may be diagnosed based on your symptoms, medical history, and an exam. Your health care provider may ask you to cough or move in a way that makes the bulge easier to see. You may also have tests done. These may include: X-rays. Ultrasound. CT scan. How is this treated? A hernia that's small and painless may  not need to be treated. A hernia that's large or painful may be treated with surgery. Surgery involves  pushing the bulge back into place and fixing the weak area of the muscle or belly. Follow these instructions at home: Activity Try not to strain. You may have to avoid lifting. Ask how much weight you can safely lift. If you lift something heavy, use your leg muscles. Do not use your back muscles to lift. Prevent trouble pooping You may need to take these actions to prevent or treat trouble pooping: Drink enough fluid to keep your pee (urine) pale yellow. Take medicines to help you poop. Eat foods high in fiber. These include beans, whole grains, and fresh fruits and vegetables. General instructions When you cough, try to cough gently. Try to push the bulge back in by very gently pressing on it when you're lying down. Do not try to force it back in if it won't push in easily. If you're overweight, work with your provider to lose weight safely. Do not smoke, vape, or use products with nicotine or tobacco in them. If you need help quitting, talk with your provider. If you're going to have surgery, watch your hernia for changes in shape, size, or color. Tell your provider about any changes. Contact a health care provider if: You get new pain, swelling, or redness near your hernia. You have trouble pooping. Your poop is harder or larger than normal. You have a fever or chills. You have nausea or vomiting. Your hernia can't be pushed in. Get help right away if: You have belly pain that gets worse. Your hernia: Changes in shape or size. Changes color. Feels hard or hurts when you touch it. These symptoms may be an emergency. Call 911 right away. Do not wait to see if the symptoms will go away. Do not drive yourself to the hospital. This information is not intended to replace advice given to you by your health care provider. Make sure you discuss any questions you have with your health care provider. Document Revised: 08/16/2022 Document Reviewed: 08/16/2022 Elsevier Patient Education   2024 Elsevier Inc.     Signed,   Meredith Staggers, MD [facility], [facility] 01/11/23 2:57 PM

## 2023-01-18 DIAGNOSIS — K4020 Bilateral inguinal hernia, without obstruction or gangrene, not specified as recurrent: Secondary | ICD-10-CM | POA: Diagnosis not present

## 2023-01-19 ENCOUNTER — Encounter (INDEPENDENT_AMBULATORY_CARE_PROVIDER_SITE_OTHER): Payer: Self-pay

## 2023-02-10 ENCOUNTER — Ambulatory Visit: Payer: Self-pay | Admitting: General Surgery

## 2023-02-10 NOTE — Progress Notes (Signed)
Surgical Instructions    Your procedure is scheduled on Tuesday February 21, 2023.  Report to [facility] Main Entrance "A" at 5:30 A.M., then check in with the Admitting office.  Call this number if you have problems the morning of surgery:  [phone]   If you have any questions prior to your surgery date call [phone]: Open Monday-Friday 8am-4pm If you experience any cold or flu symptoms such as cough, fever, chills, shortness of breath, etc. between now and your scheduled surgery, please notify us at the above number     Remember:  Do not eat after midnight the night before your surgery.  You may drink clear liquids until 4:30 the morning of your surgery.   Clear liquids allowed are: Water, Non-Citrus Juices (without pulp), Carbonated Beverages, Clear Tea, Black Coffee ONLY (NO MILK, CREAM OR POWDERED CREAMER of any kind), and Gatorade.    Take these medicines the morning of surgery with A SIP OF WATER:   atorvastatin (LIPITOR)   If needed:  acetaminophen (TYLENOL)  fluticasone (FLONASE)   As of today, STOP taking any Aspirin (unless otherwise instructed by your surgeon) Aleve, Naproxen, Ibuprofen, Motrin, Advil, Goody's, BC's, all herbal medications, fish oil, and all vitamins.  Special instructions:    Oral Hygiene is also important to reduce your risk of infection.  Remember - BRUSH YOUR TEETH THE MORNING OF SURGERY WITH YOUR REGULAR TOOTHPASTE   [place]- Preparing For Surgery  Before surgery, you can play an important role. Because skin is not sterile, your skin needs to be as free of germs as possible. You can reduce the number of germs on your skin by washing with CHG (chlorahexidine gluconate) Soap before surgery.  CHG is an antiseptic cleaner which kills germs and bonds with the skin to continue killing germs even after washing.     Please do not use if you have an allergy to CHG or antibacterial soaps. If your skin becomes reddened/irritated stop using the  CHG.  Do not shave (including legs and underarms) for at least 48 hours prior to first CHG shower. It is OK to shave your face.  Please follow these instructions carefully.     Shower the NIGHT BEFORE SURGERY and the MORNING OF SURGERY with CHG Soap.   If you chose to wash your hair, wash your hair first as usual with your normal shampoo. After you shampoo, rinse your hair and body thoroughly to remove the shampoo.  Then [employer] and genitals (private parts) with your normal soap and rinse thoroughly to remove soap.  After that Use CHG Soap as you would any other liquid soap. You can apply CHG directly to the skin and wash gently with a scrungie or a clean washcloth.   Apply the CHG Soap to your body ONLY FROM THE NECK DOWN.  Do not use on open wounds or open sores. Avoid contact with your eyes, ears, mouth and genitals (private parts). Wash Face and genitals (private parts)  with your normal soap.   Wash thoroughly, paying special attention to the area where your surgery will be performed.  Thoroughly rinse your body with warm water from the neck down.  DO NOT shower/wash with your normal soap after using and rinsing off the CHG Soap.  Pat yourself dry with a CLEAN TOWEL.  Wear CLEAN PAJAMAS to bed the night before surgery  Place CLEAN SHEETS on your bed the night before your surgery  DO NOT SLEEP WITH PETS.   Day of  Surgery:  Take a shower with CHG soap. Wear Clean/Comfortable clothing the morning of surgery Do not apply any deodorants/lotions.   Remember to brush your teeth WITH YOUR REGULAR TOOTHPASTE.  Do not wear jewelry or makeup. Do not wear lotions, powders, perfumes/cologne or deodorant. Do not shave 48 hours prior to surgery.  Men may shave face and neck. Do not bring valuables to the hospital. Do not wear nail polish, gel polish, artificial nails, or any other type of covering on natural nails (fingers and toes) If you have artificial nails or gel coating that  need to be removed by a nail salon, please have this removed prior to surgery. Artificial nails or gel coating may interfere with anesthesia's ability to adequately monitor your vital signs.  [place] is not responsible for any belongings or valuables.    Do NOT Smoke (Tobacco/Vaping)  24 hours prior to your procedure  If you use a CPAP at night, you may bring your mask for your overnight stay.   Contacts, glasses, hearing aids, dentures or partials may not be worn into surgery, please bring cases for these belongings   For patients admitted to the hospital, discharge time will be determined by your treatment team.   Patients discharged the day of surgery will not be allowed to drive home, and someone needs to stay with them for 24 hours.   SURGICAL WAITING ROOM VISITATION Patients having surgery or a procedure may have no more than 2 support people in the waiting area - these visitors may rotate.   Children under the age of 73 must have an adult with them who is not the patient. If the patient needs to stay at the hospital during part of their recovery, the visitor guidelines for inpatient rooms apply. Pre-op nurse will coordinate an appropriate time for 1 support person to accompany patient in pre-op.  This support person may not rotate.   Please refer to [email or web address] for the visitor guidelines for Inpatients (after your surgery is over and you are in a regular room).    If you received a COVID test during your pre-op visit, it is requested that you wear a mask when out in public, stay away from anyone that may not be feeling well, and notify your surgeon if you develop symptoms. If you have been in contact with anyone that has tested positive in the last 10 days, please notify your surgeon.    Please read over the following fact sheets that you were given.

## 2023-02-13 ENCOUNTER — Other Ambulatory Visit: Payer: Self-pay

## 2023-02-13 ENCOUNTER — Encounter (HOSPITAL_COMMUNITY)
Admission: RE | Admit: 2023-02-13 | Discharge: 2023-02-13 | Disposition: A | Discharge status: Home / Self Care | Payer: Medicare HMO | Source: Ambulatory Visit | Attending: General Surgery | Admitting: General Surgery

## 2023-02-13 ENCOUNTER — Encounter (HOSPITAL_COMMUNITY): Payer: Self-pay

## 2023-02-13 VITALS — BP 141/77 | HR 59 | Temp 98.4°F | Resp 17 | Ht 66.0 in | Wt 163.0 lb

## 2023-02-13 DIAGNOSIS — Z01812 Encounter for preprocedural laboratory examination: Secondary | ICD-10-CM | POA: Diagnosis not present

## 2023-02-13 DIAGNOSIS — Z01818 Encounter for other preprocedural examination: Secondary | ICD-10-CM

## 2023-02-13 LAB — CBC
HCT: 48.1 % (ref 39.0–52.0)
Hemoglobin: 16 g/dL (ref 13.0–17.0)
MCH: 31.9 pg (ref 26.0–34.0)
MCHC: 33.3 g/dL (ref 30.0–36.0)
MCV: 96 fL (ref 80.0–100.0)
Platelets: 238 10*3/uL (ref 150–400)
RBC: 5.01 MIL/uL (ref 4.22–5.81)
RDW: 12.4 % (ref 11.5–15.5)
WBC: 6.2 10*3/uL (ref 4.0–10.5)
nRBC: 0 % (ref 0.0–0.2)

## 2023-02-13 LAB — BASIC METABOLIC PANEL
Anion gap: 7 (ref 5–15)
BUN: 31 mg/dL — ABNORMAL HIGH (ref 8–23)
CO2: 27 mmol/L (ref 22–32)
Calcium: 9.2 mg/dL (ref 8.9–10.3)
Chloride: 104 mmol/L (ref 98–111)
Creatinine, Ser: 1.3 mg/dL — ABNORMAL HIGH (ref 0.61–1.24)
GFR, Estimated: 59 mL/min — ABNORMAL LOW (ref >60)
Glucose, Bld: 91 mg/dL (ref 70–99)
Potassium: 4.3 mmol/L (ref 3.5–5.1)
Sodium: 138 mmol/L (ref 135–145)

## 2023-02-13 NOTE — Progress Notes (Signed)
PCP - Karyl Kinnier Cardiologist - denies  PPM/ICD - denies Device Orders - n/a Rep Notified - n/a  Chest x-ray - denies EKG - denies Stress Test - denies ECHO - denies Cardiac Cath - denies  Sleep Study - denies CPAP - n/a  DM denies  Blood Thinner Instructions: Denies Aspirin Instructions:n/a  ERAS Protcol - NPO  COVID TEST- n/a   Anesthesia review: No  Patient denies shortness of breath, fever, cough and chest pain at PAT appointment   All instructions explained to the patient, with a verbal understanding of the material. Patient agrees to go over the instructions while at home for a better understanding. Patient also instructed to self quarantine after being tested for COVID-19. The opportunity to ask questions was provided.

## 2023-02-13 NOTE — Progress Notes (Signed)
Surgical Instructions    Your procedure is scheduled on Tuesday February 21, 2023.  Report to [facility] Main Entrance "A" at 5:30 A.M., then check in with the Admitting office.  Call this number if you have problems the morning of surgery:  [phone]   If you have any questions prior to your surgery date call [phone]: Open Monday-Friday 8am-4pm If you experience any cold or flu symptoms such as cough, fever, chills, shortness of breath, etc. between now and your scheduled surgery, please notify us at the above number     Remember:  Do not eat or dink  after midnight the night before your surgery.    Take these medicines the morning of surgery with A SIP OF WATER:   atorvastatin (LIPITOR)   If needed:  acetaminophen (TYLENOL)  fluticasone (FLONASE)   As of today, STOP taking any Aspirin (unless otherwise instructed by your surgeon) Aleve, Naproxen, Ibuprofen, Motrin, Advil, Goody's, BC's, all herbal medications, fish oil, and all vitamins.  Special instructions:    Oral Hygiene is also important to reduce your risk of infection.  Remember - BRUSH YOUR TEETH THE MORNING OF SURGERY WITH YOUR REGULAR TOOTHPASTE   [place]- Preparing For Surgery  Before surgery, you can play an important role. Because skin is not sterile, your skin needs to be as free of germs as possible. You can reduce the number of germs on your skin by washing with CHG (chlorahexidine gluconate) Soap before surgery.  CHG is an antiseptic cleaner which kills germs and bonds with the skin to continue killing germs even after washing.     Please do not use if you have an allergy to CHG or antibacterial soaps. If your skin becomes reddened/irritated stop using the CHG.  Do not shave (including legs and underarms) for at least 48 hours prior to first CHG shower. It is OK to shave your face.  Please follow these instructions carefully.     Shower the NIGHT BEFORE SURGERY and the MORNING OF SURGERY with  CHG Soap.   If you chose to wash your hair, wash your hair first as usual with your normal shampoo. After you shampoo, rinse your hair and body thoroughly to remove the shampoo.  Then [employer] and genitals (private parts) with your normal soap and rinse thoroughly to remove soap.  After that Use CHG Soap as you would any other liquid soap. You can apply CHG directly to the skin and wash gently with a scrungie or a clean washcloth.   Apply the CHG Soap to your body ONLY FROM THE NECK DOWN.  Do not use on open wounds or open sores. Avoid contact with your eyes, ears, mouth and genitals (private parts). Wash Face and genitals (private parts)  with your normal soap.   Wash thoroughly, paying special attention to the area where your surgery will be performed.  Thoroughly rinse your body with warm water from the neck down.  DO NOT shower/wash with your normal soap after using and rinsing off the CHG Soap.  Pat yourself dry with a CLEAN TOWEL.  Wear CLEAN PAJAMAS to bed the night before surgery  Place CLEAN SHEETS on your bed the night before your surgery  DO NOT SLEEP WITH PETS.   Day of Surgery:  Take a shower with CHG soap. Wear Clean/Comfortable clothing the morning of surgery Do not apply any deodorants/lotions.   Remember to brush your teeth WITH YOUR REGULAR TOOTHPASTE.  Do not wear jewelry or makeup. Do  not wear lotions, powders, perfumes/cologne or deodorant. Do not shave 48 hours prior to surgery.  Men may shave face and neck. Do not bring valuables to the hospital. Do not wear nail polish, gel polish, artificial nails, or any other type of covering on natural nails (fingers and toes) If you have artificial nails or gel coating that need to be removed by a nail salon, please have this removed prior to surgery. Artificial nails or gel coating may interfere with anesthesia's ability to adequately monitor your vital signs.  [facility] is not responsible for any belongings or  valuables.    Do NOT Smoke (Tobacco/Vaping)  24 hours prior to your procedure  If you use a CPAP at night, you may bring your mask for your overnight stay.   Contacts, glasses, hearing aids, dentures or partials may not be worn into surgery, please bring cases for these belongings   For patients admitted to the hospital, discharge time will be determined by your treatment team.   Patients discharged the day of surgery will not be allowed to drive home, and someone needs to stay with them for 24 hours.   SURGICAL WAITING ROOM VISITATION Patients having surgery or a procedure may have no more than 2 support people in the waiting area - these visitors may rotate.   Children under the age of 87 must have an adult with them who is not the patient. If the patient needs to stay at the hospital during part of their recovery, the visitor guidelines for inpatient rooms apply. Pre-op nurse will coordinate an appropriate time for 1 support person to accompany patient in pre-op.  This support person may not rotate.   Please refer to [email or web address] for the visitor guidelines for Inpatients (after your surgery is over and you are in a regular room).    If you received a COVID test during your pre-op visit, it is requested that you wear a mask when out in public, stay away from anyone that may not be feeling well, and notify your surgeon if you develop symptoms. If you have been in contact with anyone that has tested positive in the last 10 days, please notify your surgeon.    Please read over the following fact sheets that you were given.

## 2023-02-20 NOTE — Anesthesia Preprocedure Evaluation (Signed)
Anesthesia Evaluation  Patient identified by MRN, date of birth, ID band Patient awake    Reviewed: Allergy & Precautions, NPO status , Patient's Chart, lab work & pertinent test results  History of Anesthesia Complications Negative for: history of anesthetic complications  Airway Mallampati: II  TM Distance: >3 FB Neck ROM: Full    Dental  (+) Dental Advisory Given   Pulmonary neg pulmonary ROS   breath sounds clear to auscultation       Cardiovascular negative cardio ROS  Rhythm:Regular Rate:Normal     Neuro/Psych negative neurological ROS     GI/Hepatic negative GI ROS, Neg liver ROS,,,  Endo/Other  negative endocrine ROS    Renal/GU negative Renal ROS     Musculoskeletal  (+) Arthritis ,    Abdominal   Peds  Hematology negative hematology ROS (+)   Anesthesia Other Findings   Reproductive/Obstetrics                             Anesthesia Physical Anesthesia Plan  ASA: 2  Anesthesia Plan: General   Post-op Pain Management: Tylenol PO (pre-op)*   Induction: Intravenous  PONV Risk Score and Plan: 2 and Ondansetron and Dexamethasone  Airway Management Planned: Oral ETT  Additional Equipment: None  Intra-op Plan:   Post-operative Plan: Extubation in OR  Informed Consent: I have reviewed the patients History and Physical, chart, labs and discussed the procedure including the risks, benefits and alternatives for the proposed anesthesia with the patient or authorized representative who has indicated his/her understanding and acceptance.     Dental advisory given  Plan Discussed with: CRNA and Surgeon  Anesthesia Plan Comments:         Anesthesia Quick Evaluation

## 2023-02-21 ENCOUNTER — Encounter (HOSPITAL_COMMUNITY): Payer: Self-pay | Admitting: General Surgery

## 2023-02-21 ENCOUNTER — Ambulatory Visit (HOSPITAL_COMMUNITY)
Admission: RE | Admit: 2023-02-21 | Discharge: 2023-02-21 | Disposition: A | Discharge status: Home / Self Care | Payer: Medicare HMO | Attending: General Surgery | Admitting: General Surgery

## 2023-02-21 ENCOUNTER — Ambulatory Visit (HOSPITAL_COMMUNITY): Payer: Medicare HMO | Admitting: Anesthesiology

## 2023-02-21 ENCOUNTER — Other Ambulatory Visit: Payer: Self-pay

## 2023-02-21 ENCOUNTER — Encounter (HOSPITAL_COMMUNITY)
Admission: RE | Disposition: A | Discharge status: Home / Self Care | Payer: Self-pay | Source: Home / Self Care | Attending: General Surgery

## 2023-02-21 ENCOUNTER — Ambulatory Visit (HOSPITAL_BASED_OUTPATIENT_CLINIC_OR_DEPARTMENT_OTHER): Payer: Self-pay | Admitting: Anesthesiology

## 2023-02-21 DIAGNOSIS — K4190 Unilateral femoral hernia, without obstruction or gangrene, not specified as recurrent: Secondary | ICD-10-CM | POA: Diagnosis not present

## 2023-02-21 DIAGNOSIS — D176 Benign lipomatous neoplasm of spermatic cord: Secondary | ICD-10-CM | POA: Insufficient documentation

## 2023-02-21 DIAGNOSIS — K4020 Bilateral inguinal hernia, without obstruction or gangrene, not specified as recurrent: Secondary | ICD-10-CM

## 2023-02-21 DIAGNOSIS — M1990 Unspecified osteoarthritis, unspecified site: Secondary | ICD-10-CM | POA: Diagnosis not present

## 2023-02-21 HISTORY — PX: INSERTION OF MESH: SHX5868

## 2023-02-21 SURGERY — HERNIORRHAPHY, INGUINAL, ROBOT-ASSISTED, LAPAROSCOPIC
Anesthesia: General | Site: Groin | Laterality: Bilateral

## 2023-02-21 MED ORDER — ORAL CARE MOUTH RINSE: 15.0000 mL | Freq: Once | OROMUCOSAL | Status: AC

## 2023-02-21 MED ORDER — DEXAMETHASONE SODIUM PHOSPHATE 10 MG/ML IJ SOLN
INTRAMUSCULAR | Status: AC
  Filled 2023-02-21: qty 1

## 2023-02-21 MED ORDER — MIDAZOLAM HCL 2 MG/2ML IJ SOLN: 0.5000 mg | Freq: Once | INTRAMUSCULAR | Status: DC | PRN

## 2023-02-21 MED ORDER — ONDANSETRON HCL 4 MG/2ML IJ SOLN
INTRAMUSCULAR | Status: AC
  Filled 2023-02-21: qty 2

## 2023-02-21 MED ORDER — PROMETHAZINE HCL 25 MG/ML IJ SOLN: 6.2500 mg | INTRAMUSCULAR | Status: DC | PRN

## 2023-02-21 MED ORDER — 0.9 % SODIUM CHLORIDE (POUR BTL) OPTIME
TOPICAL | Status: DC | PRN
  Administered 2023-02-21: 1000 mL

## 2023-02-21 MED ORDER — SODIUM CHLORIDE 0.9 % IV SOLN: INTRAVENOUS | Status: DC | PRN

## 2023-02-21 MED ORDER — SODIUM CHLORIDE 0.9 % IV SOLN: 250.0000 mL | INTRAVENOUS | Status: DC | PRN

## 2023-02-21 MED ORDER — SODIUM CHLORIDE 0.9% FLUSH: 3.0000 mL | INTRAVENOUS | Status: DC | PRN

## 2023-02-21 MED ORDER — OXYCODONE HCL 5 MG/5ML PO SOLN: 5.0000 mg | Freq: Once | ORAL | Status: DC | PRN

## 2023-02-21 MED ORDER — SUGAMMADEX SODIUM 200 MG/2ML IV SOLN
INTRAVENOUS | Status: DC | PRN
  Administered 2023-02-21: 300 mg via INTRAVENOUS

## 2023-02-21 MED ORDER — PROPOFOL 10 MG/ML IV BOLUS
INTRAVENOUS | Status: AC
  Filled 2023-02-21: qty 20

## 2023-02-21 MED ORDER — LACTATED RINGERS IV SOLN: INTRAVENOUS | Status: DC

## 2023-02-21 MED ORDER — DEXAMETHASONE SODIUM PHOSPHATE 10 MG/ML IJ SOLN
INTRAMUSCULAR | Status: DC | PRN
  Administered 2023-02-21: 10 mg via INTRAVENOUS

## 2023-02-21 MED ORDER — ACETAMINOPHEN 325 MG PO TABS: 650.0000 mg | ORAL_TABLET | Freq: Four times a day (QID) | ORAL | 0 refills | Status: AC

## 2023-02-21 MED ORDER — MEPERIDINE HCL 25 MG/ML IJ SOLN: 6.2500 mg | INTRAMUSCULAR | Status: DC | PRN

## 2023-02-21 MED ORDER — MIDAZOLAM HCL 2 MG/2ML IJ SOLN
INTRAMUSCULAR | Status: DC | PRN
  Administered 2023-02-21: 2 mg via INTRAVENOUS

## 2023-02-21 MED ORDER — SODIUM CHLORIDE 0.9% FLUSH: 3.0000 mL | Freq: Two times a day (BID) | INTRAVENOUS | Status: DC

## 2023-02-21 MED ORDER — ONDANSETRON HCL 4 MG/2ML IJ SOLN
INTRAMUSCULAR | Status: DC | PRN
  Administered 2023-02-21: 4 mg via INTRAVENOUS

## 2023-02-21 MED ORDER — ROCURONIUM BROMIDE 10 MG/ML (PF) SYRINGE
PREFILLED_SYRINGE | INTRAVENOUS | Status: DC | PRN
  Administered 2023-02-21 (×2): 20 mg via INTRAVENOUS
  Administered 2023-02-21: 60 mg via INTRAVENOUS
  Administered 2023-02-21: 20 mg via INTRAVENOUS

## 2023-02-21 MED ORDER — BUPIVACAINE HCL 0.25 % IJ SOLN
INTRAMUSCULAR | Status: DC | PRN
  Administered 2023-02-21: 8 mL

## 2023-02-21 MED ORDER — CHLORHEXIDINE GLUCONATE 0.12 % MT SOLN
15.0000 mL | Freq: Once | OROMUCOSAL | Status: AC
  Administered 2023-02-21: 15 mL via OROMUCOSAL
  Filled 2023-02-21: qty 15

## 2023-02-21 MED ORDER — SODIUM CHLORIDE (PF) 0.9 % IJ SOLN
INTRAMUSCULAR | Status: DC | PRN
  Administered 2023-02-21: 40 mL

## 2023-02-21 MED ORDER — LIDOCAINE 2% (20 MG/ML) 5 ML SYRINGE
INTRAMUSCULAR | Status: DC | PRN
  Administered 2023-02-21: 100 mg via INTRAVENOUS

## 2023-02-21 MED ORDER — IBUPROFEN 200 MG PO TABS: 600.0000 mg | ORAL_TABLET | Freq: Four times a day (QID) | ORAL | 0 refills | Status: AC

## 2023-02-21 MED ORDER — ACETAMINOPHEN 650 MG RE SUPP: 650.0000 mg | RECTAL | Status: DC | PRN

## 2023-02-21 MED ORDER — ROCURONIUM BROMIDE 10 MG/ML (PF) SYRINGE
PREFILLED_SYRINGE | INTRAVENOUS | Status: AC
  Filled 2023-02-21: qty 10

## 2023-02-21 MED ORDER — SUCCINYLCHOLINE CHLORIDE 200 MG/10ML IV SOSY
PREFILLED_SYRINGE | INTRAVENOUS | Status: AC
  Filled 2023-02-21: qty 10

## 2023-02-21 MED ORDER — BUPIVACAINE HCL (PF) 0.25 % IJ SOLN
INTRAMUSCULAR | Status: AC
  Filled 2023-02-21: qty 30

## 2023-02-21 MED ORDER — OXYCODONE HCL 5 MG PO TABS
5.0000 mg | ORAL_TABLET | Freq: Three times a day (TID) | ORAL | 0 refills | Status: AC | PRN
Start: 2023-02-21 — End: 2024-02-21

## 2023-02-21 MED ORDER — ACETAMINOPHEN 500 MG PO TABS
1000.0000 mg | ORAL_TABLET | Freq: Once | ORAL | Status: AC
  Administered 2023-02-21: 1000 mg via ORAL
  Filled 2023-02-21: qty 2

## 2023-02-21 MED ORDER — HYDROMORPHONE HCL 1 MG/ML IJ SOLN: 0.2500 mg | INTRAMUSCULAR | Status: DC | PRN

## 2023-02-21 MED ORDER — OXYCODONE HCL 5 MG PO TABS: 5.0000 mg | ORAL_TABLET | Freq: Once | ORAL | Status: DC | PRN

## 2023-02-21 MED ORDER — OXYCODONE HCL 5 MG PO TABS: 5.0000 mg | ORAL_TABLET | ORAL | Status: DC | PRN

## 2023-02-21 MED ORDER — CEFAZOLIN SODIUM-DEXTROSE 2-4 GM/100ML-% IV SOLN
2.0000 g | INTRAVENOUS | Status: AC
  Administered 2023-02-21: 2 g via INTRAVENOUS
  Filled 2023-02-21: qty 100

## 2023-02-21 MED ORDER — MIDAZOLAM HCL 2 MG/2ML IJ SOLN
INTRAMUSCULAR | Status: AC
  Filled 2023-02-21: qty 2

## 2023-02-21 MED ORDER — BUPIVACAINE LIPOSOME 1.3 % IJ SUSP
INTRAMUSCULAR | Status: AC
  Filled 2023-02-21: qty 20

## 2023-02-21 MED ORDER — CHLORHEXIDINE GLUCONATE CLOTH 2 % EX PADS: 6.0000 | MEDICATED_PAD | Freq: Once | CUTANEOUS | Status: DC

## 2023-02-21 MED ORDER — LIDOCAINE 2% (20 MG/ML) 5 ML SYRINGE
INTRAMUSCULAR | Status: AC
  Filled 2023-02-21: qty 5

## 2023-02-21 MED ORDER — ACETAMINOPHEN 325 MG PO TABS: 650.0000 mg | ORAL_TABLET | ORAL | Status: DC | PRN

## 2023-02-21 MED ORDER — PROPOFOL 10 MG/ML IV BOLUS
INTRAVENOUS | Status: DC | PRN
Start: 2023-02-21 — End: 2023-02-21
  Administered 2023-02-21: 125 ug/kg/min via INTRAVENOUS
  Administered 2023-02-21: 140 mg via INTRAVENOUS

## 2023-02-21 MED ORDER — FENTANYL CITRATE (PF) 250 MCG/5ML IJ SOLN
INTRAMUSCULAR | Status: AC
  Filled 2023-02-21: qty 5

## 2023-02-21 MED ORDER — FENTANYL CITRATE (PF) 250 MCG/5ML IJ SOLN
INTRAMUSCULAR | Status: DC | PRN
  Administered 2023-02-21 (×5): 50 ug via INTRAVENOUS

## 2023-02-21 MED ORDER — MORPHINE SULFATE (PF) 2 MG/ML IV SOLN: 1.0000 mg | INTRAVENOUS | Status: DC | PRN

## 2023-02-21 SURGICAL SUPPLY — 61 items
ADH SKN CLS APL DERMABOND .7 (GAUZE/BANDAGES/DRESSINGS) ×2
APL PRP STRL LF DISP 70% ISPRP (MISCELLANEOUS) ×2
BAG COUNTER SPONGE SURGICOUNT (BAG) IMPLANT
BAG SPNG CNTER NS LX DISP (BAG)
CHLORAPREP W/TINT 26 (MISCELLANEOUS) ×2 IMPLANT
COVER MAYO STAND STRL (DRAPES) ×2 IMPLANT
COVER SURGICAL LIGHT HANDLE (MISCELLANEOUS) ×2 IMPLANT
COVER TIP SHEARS 8 DVNC (MISCELLANEOUS) ×2 IMPLANT
DEFOGGER SCOPE WARMER CLEARIFY (MISCELLANEOUS) ×2 IMPLANT
DERMABOND ADVANCED .7 DNX12 (GAUZE/BANDAGES/DRESSINGS) ×2 IMPLANT
DEVICE TROCAR PUNCTURE CLOSURE (ENDOMECHANICALS) ×2 IMPLANT
DRAPE ARM DVNC X/XI (DISPOSABLE) ×8 IMPLANT
DRAPE COLUMN DVNC XI (DISPOSABLE) ×2 IMPLANT
DRAPE CV SPLIT W-CLR ANES SCRN (DRAPES) ×2 IMPLANT
DRAPE ORTHO SPLIT 77X108 STRL (DRAPES) ×2
DRAPE SURG ORHT 6 SPLT 77X108 (DRAPES) ×2 IMPLANT
DRIVER NDL MEGA SUTCUT DVNCXI (INSTRUMENTS) ×2 IMPLANT
DRIVER NDLE MEGA SUTCUT DVNCXI (INSTRUMENTS) ×2
ELECT REM PT RETURN 9FT ADLT (ELECTROSURGICAL) ×2
ELECTRODE REM PT RTRN 9FT ADLT (ELECTROSURGICAL) ×2 IMPLANT
FORCEPS BPLR FENES DVNC XI (FORCEP) IMPLANT
FORCEPS PROGRASP DVNC XI (FORCEP) ×2 IMPLANT
GLOVE BIO SURGEON STRL SZ7.5 (GLOVE) ×10 IMPLANT
GLOVE BIOGEL PI IND STRL 6.5 (GLOVE) IMPLANT
GOWN STRL REUS W/ TWL LRG LVL3 (GOWN DISPOSABLE) ×4 IMPLANT
GOWN STRL REUS W/ TWL XL LVL3 (GOWN DISPOSABLE) ×4 IMPLANT
GOWN STRL REUS W/TWL 2XL LVL3 (GOWN DISPOSABLE) ×2 IMPLANT
GOWN STRL REUS W/TWL LRG LVL3 (GOWN DISPOSABLE) ×4
GOWN STRL REUS W/TWL XL LVL3 (GOWN DISPOSABLE) ×4
IRRIG SUCT STRYKERFLOW 2 WTIP (MISCELLANEOUS)
IRRIGATION SUCT STRKRFLW 2 WTP (MISCELLANEOUS) IMPLANT
KIT BASIN OR (CUSTOM PROCEDURE TRAY) ×2 IMPLANT
KIT TURNOVER KIT B (KITS) ×2 IMPLANT
MARKER SKIN DUAL TIP RULER LAB (MISCELLANEOUS) ×2 IMPLANT
MESH 3DMAX 5X7 LT XLRG (Mesh General) IMPLANT
MESH 3DMAX MID 4X6 LT LRG (Mesh General) IMPLANT
MESH 3DMAX MID 4X6 RT LRG (Mesh General) IMPLANT
NDL HYPO 22X1.5 SAFETY MO (MISCELLANEOUS) ×2 IMPLANT
NDL INSUFFLATION 14GA 120MM (NEEDLE) ×2 IMPLANT
NEEDLE HYPO 22X1.5 SAFETY MO (MISCELLANEOUS) ×2
NEEDLE INSUFFLATION 14GA 120MM (NEEDLE) ×2
OBTURATOR OPTICAL STND 8 DVNC (TROCAR) ×2
OBTURATOR OPTICALSTD 8 DVNC (TROCAR) IMPLANT
PAD ARMBOARD 7.5X6 YLW CONV (MISCELLANEOUS) ×4 IMPLANT
SCISSORS MNPLR CVD DVNC XI (INSTRUMENTS) ×2 IMPLANT
SEAL UNIV 5-12 XI (MISCELLANEOUS) ×4 IMPLANT
SET TUBE SMOKE EVAC HIGH FLOW (TUBING) ×2 IMPLANT
SPIKE FLUID TRANSFER (MISCELLANEOUS) ×2 IMPLANT
STOPCOCK 4 WAY LG BORE MALE ST (IV SETS) ×2 IMPLANT
SUT MNCRL AB 4-0 PS2 18 (SUTURE) ×2 IMPLANT
SUT VIC AB 2-0 SH 27 (SUTURE)
SUT VIC AB 2-0 SH 27X BRD (SUTURE) IMPLANT
SUT VIC AB 3-0 SH 27 (SUTURE) ×2
SUT VIC AB 3-0 SH 27X BRD (SUTURE) IMPLANT
SUT VLOC 180 2-0 6IN GS21 (SUTURE) ×2 IMPLANT
SUT VLOC 180 2-0 9IN GS21 (SUTURE) ×2 IMPLANT
SUT VLOC BARB 180 ABS3/0GR12 (SUTURE) ×4
SUTURE VLOC BRB 180 ABS3/0GR12 (SUTURE) IMPLANT
SYR 30ML SLIP (SYRINGE) ×2 IMPLANT
TOWEL GREEN STERILE FF (TOWEL DISPOSABLE) ×2 IMPLANT
TRAY LAPAROSCOPIC MC (CUSTOM PROCEDURE TRAY) ×2 IMPLANT

## 2023-02-21 NOTE — H&P (Signed)
Rodney Bailey. 02/17/1951  [id number].    HPI:  72 y/o M who presents for elective repair of bilateral inguinal hernias.  He reports that he is in his usual state of health and denies any recent hospitalizations or changes in medication  ROS: Review of Systems  Constitutional: Negative.   HENT: Negative.    Eyes: Negative.   Respiratory: Negative.    Cardiovascular: Negative.   Gastrointestinal: Negative.   Genitourinary: Negative.   Musculoskeletal: Negative.   Skin: Negative.   Neurological: Negative.   Endo/Heme/Allergies: Negative.   Psychiatric/Behavioral: Negative.      Family History  Problem Relation Age of Onset   Heart disease Mother        CHF   Stroke Father     Past Medical History:  Diagnosis Date   Allergy    Arthritis     Past Surgical History:  Procedure Laterality Date   TOTAL HIP ARTHROPLASTY Left 05/29/2019   Procedure: TOTAL HIP ARTHROPLASTY ANTERIOR APPROACH SAME DAY DISCHARGE;  Surgeon: Ollen Gross, MD;  Location: [facility];  Service: Orthopedics;  Laterality: Left;    TOTAL SHOULDER ARTHROPLASTY Left 02/13/2020   Procedure: TOTAL SHOULDER ARTHROPLASTY;  Surgeon: Francena Hanly, MD;  Location: [facility];  Service: Orthopedics;  Laterality: Left;     Social History:  reports that he has never smoked. He has never used smokeless tobacco. He reports that he does not drink alcohol and does not use drugs.  Allergies: No Known Allergies  Medications Prior to Admission  Medication Sig Dispense Refill   acetaminophen (TYLENOL) 500 MG tablet Take 500 mg by mouth every 6 (six) hours as needed for mild pain or moderate pain.     Ascorbic Acid (VITAMIN C) 1000 MG tablet Take 1,000 mg by mouth daily.     atorvastatin (LIPITOR) 10 MG tablet Take 1 tablet (10 mg total) by mouth daily. 90 tablet 2   Benfotiamine 150 MG CAPS Take 150 mg by mouth daily.     Cholecalciferol (VITAMIN D-3) 125 MCG (5000 UT) TABS Take 5,000 Units by mouth  daily.     fluticasone (FLONASE) 50 MCG/ACT nasal spray Place 2 sprays into both nostrils daily. (Patient taking differently: Place 2 sprays into both nostrils daily as needed for allergies.) 18 mL 0   GLUCOSAMINE PO Take 1,500 mg by mouth daily.      Magnesium 400 MG TABS Take 400 mg by mouth 2 (two) times daily.     Melatonin 5 MG TABS Take 5-10 mg by mouth at bedtime.     Misc Natural Products (PROSTATE SUPPORT PO) Take 3 tablets by mouth daily.     OVER THE COUNTER MEDICATION Take 280 mg by mouth daily. Horsetail     SAW PALMETTO, SERENOA REPENS, PO Take 800 mg by mouth daily.      traZODone (DESYREL) 100 MG tablet TAKE 1/2 TO 1 TABLET BY MOUTH AT BEDTIME AT BEDTIME AS NEEDED FOR SLEEP 90 tablet 3   TURMERIC PO Take 1,200 mg by mouth daily.       Physical Exam: Blood pressure (!) 141/71, pulse 68, temperature 98.7 F (37.1 C), resp. rate 18, height 5\' 6"  (1.676 m), weight 74.8 kg, SpO2 96%. Gen: male resting in bed, NAD Resp: no increased WOB CV: RRR Abd: soft, non-distended, non-tender Neuro: moving all extremities  No results found for this or any previous visit (from the past 48 hour(s)). No results found.  Assessment/Plan 72 y/o M  who presents for elective hernia repair and is in his usual state of health  - Will proceed to OR for robotic repair.  Consent obtained - Tentative plan for discharge from PACU  T[facility] 02/21/2023, 7:12 AM Please see Amion for pager number during day hours 7:00am-4:30pm or 7:00am -11:30am on weekends

## 2023-02-21 NOTE — Transfer of Care (Signed)
Immediate Anesthesia Transfer of Care Note  Patient: Rodney Bailey.  Procedure(s) Performed: XI ROBOTIC BILATERAL INGUINAL HERNIA REPAIR WITH MESH (Bilateral: Abdomen) INSERTION OF MESH (Bilateral: Groin)  Patient Location: PACU  Anesthesia Type:General  Level of Consciousness: sedated and drowsy  Airway & Oxygen Therapy: Patient Spontanous Breathing and Patient connected to nasal cannula oxygen  Post-op Assessment: Report given to RN and Post -op Vital signs reviewed and stable  Post vital signs: Reviewed and stable  Last Vitals:  Vitals Value Taken Time  BP 117/60 02/21/23 0947  Temp 97.5   Pulse 63 02/21/23 0949  Resp 19 02/21/23 0949  SpO2 100 % 02/21/23 0949  Vitals shown include unfiled device data.  Last Pain:  Vitals:   02/21/23 0619  PainSc: 0-No pain         Complications: No notable events documented.

## 2023-02-21 NOTE — Op Note (Signed)
Rodney Bailey. ([id number])  Operative Report   Date 02/21/2023  PREOPERATIVE DIAGNOSIS: Bilateral Inguinal Hernia  POSTOPERATIVE DIAGNOSIS: Same  PROCEDURE:  Robotic bilateral Inguinal Hernia Repair with Mesh (Bard Contour 3D Max)  SURGEON:Tita Terhaar, MD  ANESTHESIA: General Anesthesia    INTRAOPERATIVE FINDINGS: Large direct inguinal hernia defects bilaterally.  Cord lipoma on the left  IMPLANTS: Implant Name Type Inv. Item Serial No. Manufacturer Lot No. LRB No. Used Action  MESH 3DMAX MID 4X6 LT LRG - [id number] Mesh General MESH 3DMAX MID 4X6 LT LRG  [employer] BARD ACCESS [id number] Left 1 Implanted  MESH 3DMAX MID 4X6 RT LRG - [id number] Mesh General MESH 3DMAX MID 4X6 RT LRG  [employer] BARD ACCESS [id number] Right 1 Implanted    ESTIMATED BLOOD LOSS: Minimal  COMPLICATIONS: None  SPECIMENS: None  OPERATIVE INDICATIONS: Pt is a 72 y.o. male who presents with bilateral inguinal hernias based on physical exam.  The patient desires definitive repair.  The procedure's risks, benefits, and alternatives were explained to the patient.  Risks, including the risks of bleeding, infection, need for mesh removal, and potential for hernia recurrence, were discussed.  The patient agreed to proceed and signed informed consent in front of a witness.   DESCRIPTION OF PROCEDURE: After preoperatively identifying the patient in holding, the patient was brought to the operating room and placed supine on the operating room table.  Both arms were tucked and padded to avoid potential nerve injury.  Sequential Compression Devices were placed bilaterally.  After induction of general anesthesia, the patient was given the appropriate perioperative antibiotics.  The abdomen was prepped and draped in a typical sterile fashion.  A JACHO approved time out, where the name of the patient, the operation, and the intended site were confirmed.  The procedure was begun.  A veress needle was inserted in the LUQ  at Palmer's point.  After confirming aspiration of air and a positive saline drip test, the insufflation tubing was connected and the abdomen brought to a pressure of . An 8 mm optical port was placed left of midline under direct visualization.  A camera was introduced into the abdomen, and a thorough inspection of the abdomen was performed to confirm there was no additional pathology.  Two additional 8 mm robotic ports were placed superior to the umbilicus and right of midline under direct visualization.  The patient was then placed into 15-degree Trendelenburg position to facilitate examination of the bilateral inguinal spaces.  A thorough inspection of the abdomen was undertaken. Bilateral direct inguinal hernia defects were identified and amenable to robotic repair.       The [employer] robot was brought into the field and docked.  An 8 mm 30-degree scope was placed in the mid-abdominal port.  The peritoneum was taken down on the left side 6 cm superior to the hernia from the anterior abdominal wall, and dissection was taken inferiorly towards the hernia.  Medial to the epigastric vessels, the parietal compartment was dissected and completed to visualize the rectus muscle.  This dissection was carried down to the symphysis pubis and obturator foramen.  The retropubic space was dissected to expose at least 2 cm contralateral to the midline.  Cooper's ligament was exposed and cleared at least 2 cm below the ligament to ensure adequate space for the inferior border of the mesh.  Hesselbach's triangle was cleared, assessing for a direct hernia. The direct hernia was reduced, dissecting the contents away from the border of the  transversalis fascia.  There were no herniated femoral contents on the left.    Lateral to the epigastric vessels, the dissection was carried out into the visceral compartment, continuing in the true preperitoneal plane.  There was no evidence of an indirect hernia, however, there was a  cord lipoma that was carefully dissected free.  This dissection continued until the cord structures were parietalized completely, allowing for continuous visualization of the reflected peritoneum with the line originating 2 cm below Cooper's medially and across the psoas muscle in the lateral compartment.   Any component of cord lipoma was reduced to the retroperitoneum and seated dorsal to the preperitoneal mesh.  Having achieved a complete dissection with a critical view of the entire myopectineal orifice, a piece of Bard 3D Contour Polypropylene Mesh was introduced into the field.  It was centered at the iliopubic tract, with the medial side crossing the midline and the inferior edge positioned 2 cm below Cooper's ligament.  With complete coverage of the myopectineal orifice, the inferior edge of the peritoneum was posterior and inferior to the mesh.  The lateral aspect of the mesh extended 3-5 cm beyond the lateral edge of the psoas.  Cephalad retraction of the peritoneal flap did not cause lifting of the inferior mesh edge or cord structures.  The mesh was fixated using an interrupted 3-0 Vicryl suture placed to the ipsilateral Coopers ligament.  The peritoneal flap was closed with a running 3-0 Stratafix barbed suture.    The same steps were undertaken on the right side.  On the right side there was a large direct hernia defect with a small fat-containing femoral hernia.   Additional holes in the peritoneum closed.  Hemostasis was assured in the entirety of the abdomen.  The two lateral ports were removed under direct visualization.  The abdomen was desufflated under direct visualization.  The port sites were closed, local anesthetic was infiltrated, and Dermabond was applied.  After confirming twice that the sponge, needle, and instrument counts were correct, the procedure was terminated, the patient was extubated and transferred to the recovery room in stable condition.     DISPOSITION: Stable to  PACU.   PROCEDURAL BILLING:  Procedure:    XI ROBOTIC BILATERAL INGUINAL HERNIA REPAIR WITH MESH CPT(R) Code:  57846 - PR LAPAROSCOPY SURG RPR INITIAL INGUINAL HERNIA  Procedure:    INSERTION OF MESH CPT(R) Code:  96295 - PR INSJ BIOMCHN DEV INTERVERTEBRAL DSC SPC W/ARTHRD   Electronically Signed By: Moise Boring 02/21/2023 9:37 AM

## 2023-02-21 NOTE — Discharge Instructions (Signed)
Home Care After Hernia Repair   Activity  Limit activity for the first 24 hours, then you may return to normal daily activities. Returning to normal daily activities as soon as you can following surgery will enhance recovery time.  No heavy lifting pushing or pulling, anything heavier than 10 pounds (gallon of milk weighs approx. 8.8 pounds) for 4-6 weeks from surgery date.  Do not mow the lawn, use a vacuum cleaner, or do any other strenuous activities without first consulting your surgical team.  Climb stairs slowly and watch your step.  Walk as often as you feel able to increase strength and endurance.  No driving or operating heavy machinery within 24 hours of taking narcotic pain medication.  Diet  Drink plenty of fluids and eat a light meal on the night of surgery. Some patients may find their appetite is poor for a week or two after surgery. This is a normal result of the stress of surgery-your appetite will return in time.   There are no specific diet restrictions after surgery.  Dressings and Wound Care  Keep your wound or incision site clean and dry.  You may have different types of dressings covering your incisions depending on your operation and your surgeon: o Dermabond/Durabond (skin glue): This will usually remain in place for 10-14 days, then naturally fall off your skin. You may take a shower 24 hrs after surgery, carefully wash, not scrub the incision site with a mild non-scented soap. Pat dry with a soft towel.  Do not pick or peel skin glue off.  Do not use creams, powder, salves or balms on your incision(s).    What to Expect After Surgery   Moderate discomfort controlled with medications  Minimal drainage from incision  You may feel pain in one or both shoulders. This pain comes from the gas still left in your belly after the surgery, if you had laparoscopic surgery (several small incisions). The pain should ease over several days to a week. Ambulation will help with  this pain.   Belly swelling  Feeling fatigue and weak  Constipation after surgery is common. Drink plenty fluids and eat a high fiber diet.  Swelling - In some patients might feel that their hernia has returned after surgery-DO NOT Worry this is normal. Swelling may be due to the development of a seroma. Seroma is fluid that has built up where the hernia was repaired this is a normal result after surgery and it will slowly reabsorb back into your body over the next several weeks.   (MALE PATIENTS ONLY)-esp following inguinal hernia repair, it is expected that your scrotum may be slightly swollen or tender. Along with oral NSAIDS medications you can apply ice packs, wear compression shorts, and/or elevate scrotum using a rolled up towel.  Also, a bladder catheter may have been placed during your surgery.  Because of this, it may hurt to urinate for a couple of days or you may pass some blood clots. These issues are expected and will go away after a few days. Please notify surgeon or report to Emergency Dept. if you are unable to urinate or your symptoms worsen.    Pain Control: Prescribed Non-Narcotic Pain Medication  You will be given three prescriptions.  Two of them will be for prescription strength ibuprofen (i.e. Advil) and prescription strength acetaminophen (i.e. Tylenol).  The vast majority of patients will just need these two medications.  One prescription will be for a 'rescue' prescription of an oral narcotic (  oxycodone).  You may fill this if needed.  You will alternate taking the ibuprofen (600mg ) every 6 hours and also the acetaminophen (650mg ) every 6 hours so that you are taking one of those medications every 3 hours.  For example: o 0800 - take ibuprofen 600mg  o 1100 - take acetaminophen 650mg  o 1400 - take ibuprofen 600mg  o 1700 - take acetaminophen 650mg  o Etc.  Continue taking this alternating pattern of ibuprofen and acetaminophen for 3 days  If you cannot take one or the other  of these medications, just take the one you can every 6 hours.  If you are comfortable at night, you don't have to wake up and take a medication.  If you are still uncomfortable after taking either ibuprofen or acetaminophen, try gentle stretching exercise and ice packs (a bag of frozen vegetables works great).  If you are still uncomfortable, you may fill the narcotic prescription of Oxycodone and take as directed.  Once you have completed these prescriptions, your pain level should be low enough to stop taking medications altogether or just use an over the counter medication (ibuprofen or acetaminophen) as needed.   Pain Control: Over the Counter Medications to take as needed:  Colace/Docusate: May be prescribed by your surgeon to prevent constipation caused by the combination of narcotics, effects of anesthesia, and decreased ambulation.  Hold for loose stools or diarrhea. Take 100 mg 1-2 times a day starting tonight.   Fiber: High fiber foods, extra liquids (water 9-13 cups/day) can also assist with constipation. Examples of high fiber foods are fruit, bran. Prune juice and water are also good liquids to drink.  Milk of Magnesia/Miralax:  If constipated despite take the over the counter stool softeners, you may take Milk of Magnesia or Miralax as directed on bottle to assist with constipation.     Pepcid/Famotidine: May be prescribed while taking naproxen (Aleve) or other NSAIDs such as ibuprofen (Motrin/Advil) to prevent stomach upset or Acid-reflux symptoms. Take 1 tablet 1-2 times a day.   **Constipation: The first bowel movement may occur anywhere between 1-5 days after surgery.  As long as you are not nauseated or not having significant abdominal pain this variation is acceptable.  Narcotic pain medications can cause constipation increasing discomfort; early discontinuation will assist with bowel management.If constipated despite taking stool softeners, you may take Milk of Magnesia or Miralax  as directed on the bottle.     **Home medications: You may restart your home medications as directed by your respective Primary Care Physician or Surgeon.  When to notify your Doctor or Healthcare Team:   Sign of Wound Infection   Fever over 100 degrees.  Wound becomes extremely swollen, shows red streaks, warm to the touch, and/or drainage from the incision site or foul-smelling drainage.  Wound edges separate or opens up  Bleeding or bruising   If you have bleeding, apply pressure to the site and hold the pressure firmly for 5 minutes. If the bleeding continues, apply pressure again and call 911. If the bleeding stopped, call your doctor to report it.   Call your doctor or nurse if you have increased bleeding from your site and increased bruising or a lump forms or gets larger under your skin at the site.  Unrelieved Pain    Call your doctor or nurse if your pain gets worse or is not eased 1 hour after taking your pain medicine, or if it is severe and uncontrolled.  Nausea and Vomiting   Call your  doctor or nurse if you have nausea and vomiting that continues more than 24 hours, will not let you keep medicine down and will not let you keep fluids down  Fever, Flu-like symptoms   Fever over 100 degrees and/or chills  Gastrointestinal Bleeding Symptoms    Black tarry bowel movements.  This can be normal after surgery on the stomach, but should resolve in a day or two.    Call 911 if you suddenly have signs of blood loss such as:  Vomiting blood  Fast heart rate  Feeling faint, sweaty, or blacking out  Passing bright red blood from your rectum  Blood Clot Symptoms   Tender, swollen or reddened areas in your calf muscle or thighs.  Numbness or tingling in your lower leg or calf, or at the top of your leg or groin  Skin on your leg looks pale or blue or feels cold to touch  Chest pain or have trouble breathing, lightheadedness, fast heart rate  Sudden Onset of Symptoms    Call 911  if you suddenly have:  Leg weakness and spasm  Loss of bladder or bowel function  Seizure  Confusion, severe headache, dizziness or feeling unsteady, problems talking, difficulty swallowing, and/or numbness or muscle weakness as these could be signs of a stroke.   Follow up Appointment Your follow up appointment should be scheduled 4-6 weeks after your surgery date.  If you have not previously scheduled for a follow-up visit you can be scheduled by contacting [phone].

## 2023-02-21 NOTE — Anesthesia Postprocedure Evaluation (Signed)
Anesthesia Post Note  Patient: Rodney Bailey.  Procedure(s) Performed: XI ROBOTIC BILATERAL INGUINAL HERNIA REPAIR WITH MESH (Bilateral: Abdomen) INSERTION OF MESH (Bilateral: Groin)     Patient location during evaluation: PACU Anesthesia Type: General Level of consciousness: awake and alert, oriented and patient cooperative Pain management: pain level controlled Vital Signs Assessment: post-procedure vital signs reviewed and stable Respiratory status: spontaneous breathing, nonlabored ventilation and respiratory function stable Cardiovascular status: blood pressure returned to baseline and stable Postop Assessment: no apparent nausea or vomiting and able to ambulate Anesthetic complications: no   No notable events documented.  Last Vitals:  Vitals:   02/21/23 1030 02/21/23 1045  BP: 119/73 123/74  Pulse: 72 74  Resp: 20 (!) 21  Temp:  36.8 C  SpO2: 99% 94%    Last Pain:  Vitals:   02/21/23 1015  PainSc: 0-No pain                 [name],E. Valin Massie

## 2023-02-21 NOTE — Anesthesia Procedure Notes (Addendum)
Procedure Name: Intubation Date/Time: 02/21/2023 7:32 AM  Performed by: Pincus Large, CRNAPre-anesthesia Checklist: Patient identified, Emergency Drugs available, Suction available and Patient being monitored Patient Re-evaluated:Patient Re-evaluated prior to induction Oxygen Delivery Method: Circle System Utilized Preoxygenation: Pre-oxygenation with 100% oxygen Induction Type: IV induction Ventilation: Mask ventilation without difficulty Laryngoscope Size: Mac and 4 Grade View: Grade II Tube type: Oral Tube size: 8.0 mm Number of attempts: 1 Airway Equipment and Method: Stylet and Oral airway Placement Confirmation: ETT inserted through vocal cords under direct vision, positive ETCO2 and breath sounds checked- equal and bilateral Secured at: 23 cm Tube secured with: Tape Dental Injury: Teeth and Oropharynx as per pre-operative assessment

## 2023-02-22 ENCOUNTER — Encounter (HOSPITAL_COMMUNITY): Payer: Self-pay | Admitting: General Surgery

## 2023-03-01 ENCOUNTER — Ambulatory Visit (INDEPENDENT_AMBULATORY_CARE_PROVIDER_SITE_OTHER): Payer: Medicare HMO | Admitting: Family Medicine

## 2023-03-01 ENCOUNTER — Encounter: Payer: Self-pay | Admitting: Family Medicine

## 2023-03-01 VITALS — BP 120/74 | HR 60 | Temp 98.5°F | Ht 65.5 in | Wt 163.2 lb

## 2023-03-01 DIAGNOSIS — Z0000 Encounter for general adult medical examination without abnormal findings: Secondary | ICD-10-CM

## 2023-03-01 DIAGNOSIS — G4700 Insomnia, unspecified: Secondary | ICD-10-CM

## 2023-03-01 DIAGNOSIS — E785 Hyperlipidemia, unspecified: Secondary | ICD-10-CM | POA: Diagnosis not present

## 2023-03-01 LAB — LIPID PANEL
Cholesterol: 197 mg/dL (ref 0–200)
HDL: 43.6 mg/dL (ref >39.00)
LDL Cholesterol: 129 mg/dL — ABNORMAL HIGH (ref 0–99)
NonHDL: 153.5
Total CHOL/HDL Ratio: 5
Triglycerides: 125 mg/dL (ref 0.0–149.0)
VLDL: 25 mg/dL (ref 0.0–40.0)

## 2023-03-01 LAB — COMPREHENSIVE METABOLIC PANEL
ALT: 81 U/L — ABNORMAL HIGH (ref 0–53)
AST: 51 U/L — ABNORMAL HIGH (ref 0–37)
Albumin: 4.2 g/dL (ref 3.5–5.2)
Alkaline Phosphatase: 59 U/L (ref 39–117)
BUN: 28 mg/dL — ABNORMAL HIGH (ref 6–23)
CO2: 30 mEq/L (ref 19–32)
Calcium: 9.7 mg/dL (ref 8.4–10.5)
Chloride: 103 mEq/L (ref 96–112)
Creatinine, Ser: 1.27 mg/dL (ref 0.40–1.50)
GFR: 56.78 mL/min — ABNORMAL LOW (ref >60.00)
Glucose, Bld: 95 mg/dL (ref 70–99)
Potassium: 4.5 mEq/L (ref 3.5–5.1)
Sodium: 139 mEq/L (ref 135–145)
Total Bilirubin: 1.5 mg/dL — ABNORMAL HIGH (ref 0.2–1.2)
Total Protein: 6.8 g/dL (ref 6.0–8.3)

## 2023-03-01 MED ORDER — ATORVASTATIN CALCIUM 10 MG PO TABS
10.0000 mg | ORAL_TABLET | Freq: Every day | ORAL | 2 refills | Status: DC
Start: 2023-03-01 — End: 2023-08-30

## 2023-03-01 NOTE — Patient Instructions (Addendum)
Try 1/2 pill trazodone to see if that is effective for sleep. Follow up if soft stool persists. You can try a fiber supplement like citrucel or metamucil is an option if needed.  No other med changes at this time.  Tetanus booster at pharmacy after 10/14. Check into coverage/cost first.  Other tests looked okay so I am not concerned about the elevated bilirubin but I will check that again on your labs today as well as your cholesterol levels and other screening labs.  Continue same dose atorvastatin for now.  Follow-up in 6 months but please let me know if there are questions in the meantime.  Glad you are healing up from your surgery.  Hang in there.  Preventive Care 1 Years and Older, Male Preventive care refers to lifestyle choices and visits with your health care provider that can promote health and wellness. Preventive care visits are also called wellness exams. What can I expect for my preventive care visit? Counseling During your preventive care visit, your health care provider may ask about your: Medical history, including: Past medical problems. Family medical history. History of falls. Current health, including: Emotional well-being. Home life and relationship well-being. Sexual activity. Memory and ability to understand (cognition). Lifestyle, including: Alcohol, nicotine or tobacco, and drug use. Access to firearms. Diet, exercise, and sleep habits. Work and work Astronomer. Sunscreen use. Safety issues such as seatbelt and bike helmet use. Physical exam Your health care provider will check your: Height and weight. These may be used to calculate your BMI (body mass index). BMI is a measurement that tells if you are at a healthy weight. Waist circumference. This measures the distance around your waistline. This measurement also tells if you are at a healthy weight and may help predict your risk of certain diseases, such as type 2 diabetes and high blood pressure. Heart rate  and blood pressure. Body temperature. Skin for abnormal spots. What immunizations do I need?  Vaccines are usually given at various ages, according to a schedule. Your health care provider will recommend vaccines for you based on your age, medical history, and lifestyle or other factors, such as travel or where you work. What tests do I need? Screening Your health care provider may recommend screening tests for certain conditions. This may include: Lipid and cholesterol levels. Diabetes screening. This is done by checking your blood sugar (glucose) after you have not eaten for a while (fasting). Hepatitis C test. Hepatitis B test. HIV (human immunodeficiency virus) test. STI (sexually transmitted infection) testing, if you are at risk. Lung cancer screening. Colorectal cancer screening. Prostate cancer screening. Abdominal aortic aneurysm (AAA) screening. You may need this if you are a current or former smoker. Talk with your health care provider about your test results, treatment options, and if necessary, the need for more tests. Follow these instructions at home: Eating and drinking  Eat a diet that includes fresh fruits and vegetables, whole grains, lean protein, and low-fat dairy products. Limit your intake of foods with high amounts of sugar, saturated fats, and salt. Take vitamin and mineral supplements as recommended by your health care provider. Do not drink alcohol if your health care provider tells you not to drink. If you drink alcohol: Limit how much you have to 0-2 drinks a day. Know how much alcohol is in your drink. In the [place] one drink equals one 12 oz bottle of beer (355 mL), one 5 oz glass of wine (148 mL), or one 1 oz glass of  hard liquor (44 mL). Lifestyle Brush your teeth every morning and night with fluoride toothpaste. Floss one time each day. Exercise for at least 30 minutes 5 or more days each week. Do not use any products that contain nicotine or tobacco.  These products include cigarettes, chewing tobacco, and vaping devices, such as e-cigarettes. If you need help quitting, ask your health care provider. Do not use drugs. If you are sexually active, practice safe sex. Use a condom or other form of protection to prevent STIs. Take aspirin only as told by your health care provider. Make sure that you understand how much to take and what form to take. Work with your health care provider to find out whether it is safe and beneficial for you to take aspirin daily. Ask your health care provider if you need to take a cholesterol-lowering medicine (statin). Find healthy ways to manage stress, such as: Meditation, yoga, or listening to music. Journaling. Talking to a trusted person. Spending time with friends and family. Safety Always wear your seat belt while driving or riding in a vehicle. Do not drive: If you have been drinking alcohol. Do not ride with someone who has been drinking. When you are tired or distracted. While texting. If you have been using any mind-altering substances or drugs. Wear a helmet and other protective equipment during sports activities. If you have firearms in your house, make sure you follow all gun safety procedures. Minimize exposure to UV radiation to reduce your risk of skin cancer. What's next? Visit your health care provider once a year for an annual wellness visit. Ask your health care provider how often you should have your eyes and teeth checked. Stay up to date on all vaccines. This information is not intended to replace advice given to you by your health care provider. Make sure you discuss any questions you have with your health care provider. Document Revised: 11/18/2020 Document Reviewed: 11/18/2020 Elsevier Patient Education  2024 [employer].

## 2023-03-01 NOTE — Progress Notes (Signed)
Subjective:  Patient ID: Glee Arvin., male    DOB: 06/29/1950  Age: 71 y.o. MRN: [id number]  CC:  Chief Complaint  Patient presents with   Annual Exam    HPI Derrel Litteken Arnaldo Natal. presents for Annual Exam PCP, me General surgery, Dr. Hillery Hunter, bilateral inguinal hernia repair -robotic on 02/21/2023. Recovering well. Sore first day or two. Off oxycodone - only 4 days. No fevers. Feeling better.  Dermatology, Dr. Jorja Loa - no recent derm visit. No hx of skin CA. Dr. Margo Aye subsequently.  Orthopedic, shoulder, Dr. Rennis Chris. Optometry, [facility]fe[facility] Pod[facility], Dr. Suzette Battiest Urology, Dr. Annabell Howells, BPH, history of elevated PSA improved on recheck, had been elevated after bike ride.  PSA monitoring until age 40 recommended. Gastroenterology, Dr. Loreta Ave with colonoscopy in December 2022.  7-year repeat.  COVID-19 infection in June.  Treated with Paxlovid, no residual symptoms.   Hyperlipidemia: Lipitor 10 mg daily, no new side effects,  no new myalgias.  Suspected Gilbert's syndrome with isolated hyperbilirubinemia, other LFTs have been normal, path review reassuring, CBC reassuring. Lab Results  Component Value Date   CHOL 158 08/18/2022   HDL 46.60 08/18/2022   LDLCALC 98 08/18/2022   TRIG 70.0 08/18/2022   CHOLHDL 3 08/18/2022   Lab Results  Component Value Date   ALT 26 08/18/2022   AST 30 08/18/2022   ALKPHOS 53 08/18/2022   BILITOT 0.8 09/09/2022   Allergic rhinitis Flonase as needed - still working well. Rare need.   Insomnia: Trazodone 100 mg nightly. Working ok. Some soft stool since taking. Sleeping ok - plans to try lower dose. No diarrhea, melena, hematochezia.  Denies depression. Recovering from surgery.      03/01/2023    9:27 AM 01/11/2023    2:11 PM 09/09/2022    9:47 AM 09/06/2022   10:41 AM 08/18/2022    2:22 PM  Depression screen PHQ 2/9  Decreased Interest 1 0 0 0 0  Down, Depressed, Hopeless 0 0 0 0 0  PHQ - 2 Score 1 0 0 0 0  Altered sleeping 0 1  1 1   Tired,  decreased energy 0 0  1 1  Change in appetite 0 0  0 0  Feeling bad or failure about yourself  0 0  0 0  Trouble concentrating 0 0  0 0  Moving slowly or fidgety/restless 0 0  0 0  Suicidal thoughts 0 0  0 0  PHQ-9 Score 1 1  2 2   Difficult doing work/chores Not difficult at all   Not difficult at all     Health Maintenance  Topic Date Due   Medicare Annual Wellness (AWV)  03/09/2023   DTaP/Tdap/Td (2 - Td or Tdap) 03/20/2023   COVID-19 Vaccine (5 - 2023-24 season) 03/26/2023   Colonoscopy  06/03/2031   Pneumonia Vaccine 79+ Years old  Completed   INFLUENZA VACCINE  Completed   Hepatitis C Screening  Completed   Zoster Vaccines- Shingrix  Completed   HPV VACCINES  Aged Out  Colonoscopy repeat in 2029.  Prostate: does not have family history of prostate cancer The natural history of prostate cancer and ongoing controversy regarding screening and potential treatment outcomes of prostate cancer has been discussed with the patient. The meaning of a false positive PSA and a false negative PSA has been discussed. He indicates understanding of the limitations of this screening test and wishes  to proceed with screening PSA testing. No recent cycling. Had normal PSA in March.  Lab Results  Component Value Date   PSA1 2.7 01/18/2019   PSA1 2.6 12/05/2017   PSA 2.81 08/18/2022   PSA 8.38 Repeated and verified X2. (H) 01/18/2021   PSA 1.60 11/03/2015    Immunization History  Administered Date(s) Administered   Fluad Quad(high Dose 65+) 01/19/2019   Influenza, High Dose Seasonal PF 02/27/2018, 01/29/2023   Influenza,inj,Quad PF,6+ Mos 03/28/2017   Influenza,inj,quad, With Preservative 03/07/2019   Influenza-Unspecified 02/27/2018   PFIZER(Purple Top)SARS-COV-2 Vaccination 07/15/2019, 08/09/2019, 03/27/2020, 01/29/2023   Pneumococcal Conjugate-13 03/10/2017   Pneumococcal Polysaccharide-23 11/11/2019   Tdap 03/19/2013   Zoster Recombinant(Shingrix) 01/19/2019, 04/08/2019   Flu  vaccine and covid vaccine at [facility] in past month.  Deferred RSV vaccine for now.   No results found. Wears glasses. Appt with Dr. Hyacinth Meeker 11/07/22.   Dental: every 6 months. Recent visit.   Alcohol:none  Tobacco: none.   Exercise: walking while recovering from surgery, plans to restart biking when able - 3-4d/week, and walking.   Wears hearing aids - working well.    History Patient Active Problem List   Diagnosis Date Noted   Osteoarthritis of left glenohumeral joint 10/03/2019   OA (osteoarthritis) of hip 05/29/2019   Other specified arthritis, left hip 05/29/2019   Pain of left hip joint 02/22/2019   Need for influenza vaccination 03/28/2017   Injury of toe on right foot 03/28/2017   Subungual hematoma of great toe of right foot 03/28/2017   Insomnia 11/02/2016   Nocturia 11/03/2015   Hematuria 04/26/2014   Past Medical History:  Diagnosis Date   Allergy    Arthritis    Past Surgical History:  Procedure Laterality Date   INSERTION OF MESH Bilateral 02/21/2023   Procedure: INSERTION OF MESH;  Surgeon: Moise Boring, MD;  Location: [facility];  Service: General;  Laterality: Bilateral;   TOTAL HIP ARTHROPLASTY Left 05/29/2019   Procedure: TOTAL HIP ARTHROPLASTY ANTERIOR APPROACH SAME DAY DISCHARGE;  Surgeon: Ollen Gross, MD;  Location: [facility] ORS;  Service: Orthopedics;  Laterality: Left;    TOTAL SHOULDER ARTHROPLASTY Left 02/13/2020   Procedure: TOTAL SHOULDER ARTHROPLASTY;  Surgeon: Francena Hanly, MD;  Location: [facility] ORS;  Service: Orthopedics;  Laterality: Left;    No Known Allergies Prior to Admission medications   Medication Sig Start Date End Date Taking? Authorizing Provider  Ascorbic Acid (VITAMIN C) 1000 MG tablet Take 1,000 mg by mouth daily.   Yes [provider]  atorvastatin (LIPITOR) 10 MG tablet Take 1 tablet (10 mg total) by mouth daily. 08/18/22  Yes Shade Flood, MD  Benfotiamine 150 MG CAPS Take 150 mg by mouth  daily.   Yes [provider]  Cholecalciferol (VITAMIN D-3) 125 MCG (5000 UT) TABS Take 5,000 Units by mouth daily.   Yes [provider]  fluticasone (FLONASE) 50 MCG/ACT nasal spray Place 2 sprays into both nostrils daily. Patient taking differently: Place 2 sprays into both nostrils daily as needed for allergies. 04/05/21 03/01/23 Yes Theadora Rama Scales, PA-C  GLUCOSAMINE PO Take 1,500 mg by mouth daily.    Yes [provider]  Magnesium 400 MG TABS Take 400 mg by mouth 2 (two) times daily.   Yes [provider]  Melatonin 5 MG TABS Take 5-10 mg by mouth at bedtime.   Yes [provider]  Misc Natural Products (PROSTATE SUPPORT PO) Take 3 tablets by mouth daily.   Yes [provider]  OVER THE COUNTER MEDICATION Take 280 mg by mouth  daily. Horsetail   Yes [provider]  oxyCODONE (ROXICODONE) 5 MG immediate release tablet Take 1 tablet (5 mg total) by mouth every 8 (eight) hours as needed. 02/21/23 02/21/24 Yes Metzger, Lucilla Edin, MD  SAW PALMETTO, SERENOA REPENS, PO Take 800 mg by mouth daily.    Yes [provider]  traZODone (DESYREL) 100 MG tablet TAKE 1/2 TO 1 TABLET BY MOUTH AT BEDTIME AT BEDTIME AS NEEDED FOR SLEEP 07/15/22  Yes Shade Flood, MD  TURMERIC PO Take 1,200 mg by mouth daily.    Yes [provider]   Social History   Socioeconomic History   Marital status: Married    Spouse name: Not on file   Number of children: Not on file   Years of education: Not on file   Highest education level: Not on file  Occupational History   Not on file  Tobacco Use   Smoking status: Never   Smokeless tobacco: Never  Vaping Use   Vaping status: Never Used  Substance and Sexual Activity   Alcohol use: No   Drug use: No   Sexual activity: Yes    Birth control/protection: None    Comment: SEX PARTNERS IN THE LAST 12 MONTHS 1 AND CURRENT BIRTH CONTROL - NOT NEEDED  Other Topics Concern   Not on file   Social History Narrative   EXERCISE WALKING AND WEIGHT TRAINING 5-6 DAYS/WEEK FOR 30 MINUTES   His wife died 2017-03-25   Social Determinants of Health   Financial Resource Strain: Low Risk  (03/08/2022)   Overall Financial Resource Strain (CARDIA)    Difficulty of Paying Living Expenses: Not hard at all  Food Insecurity: No Food Insecurity (03/08/2022)   Hunger Vital Sign    Worried About Running Out of Food in the Last Year: Never true    Ran Out of Food in the Last Year: Never true  Transportation Needs: No Transportation Needs (03/08/2022)   PRAPARE - Administrator, Civil Service (Medical): No    Lack of Transportation (Non-Medical): No  Physical Activity: Sufficiently Active (03/08/2022)   Exercise Vital Sign    Days of Exercise per Week: 6 days    Minutes of Exercise per Session: 40 min  Stress: No Stress Concern Present (03/08/2022)   [employer] of Occupational Health - Occupational Stress Questionnaire    Feeling of Stress : Not at all  Social Connections: Moderately Integrated (03/08/2022)   Social Connection and Isolation Panel [NHANES]    Frequency of Communication with Friends and Family: More than three times a week    Frequency of Social Gatherings with Friends and Family: More than three times a week    Attends Religious Services: More than 4 times per year    Active Member of [employer] or Organizations: No    Attends Banker Meetings: Never    Marital Status: Married  Catering manager Violence: Not At Risk (03/08/2022)   Humiliation, Afraid, Rape, and Kick questionnaire    Fear of Current or Ex-Partner: No    Emotionally Abused: No    Physically Abused: No    Sexually Abused: No    Review of Systems  13 point review of systems per patient health survey noted.  Negative other than as indicated above or in HPI.   Objective:   Vitals:   03/01/23 0921  BP: 120/74  Pulse: 60  Temp: 98.5 F (36.9 C)  TempSrc: Oral  SpO2: 98%   Weight: 163 lb  3.2 oz (74 kg)  Height: 5' 5.5" (1.664 m)     Physical Exam Vitals reviewed.  Constitutional:      Appearance: He is well-developed.  HENT:     Head: Normocephalic and atraumatic.     Right Ear: External ear normal.     Left Ear: External ear normal.  Eyes:     Conjunctiva/sclera: Conjunctivae normal.     Pupils: Pupils are equal, round, and reactive to light.  Neck:     Thyroid: No thyromegaly.  Cardiovascular:     Rate and Rhythm: Normal rate and regular rhythm.     Heart sounds: Normal heart sounds.  Pulmonary:     Effort: Pulmonary effort is normal. No respiratory distress.     Breath sounds: Normal breath sounds. No wheezing.  Abdominal:     General: There is no distension.     Palpations: Abdomen is soft.     Tenderness: There is no abdominal tenderness.  Musculoskeletal:        General: No tenderness. Normal range of motion.     Cervical back: Normal range of motion and neck supple.  Lymphadenopathy:     Cervical: No cervical adenopathy.  Skin:    General: Skin is warm and dry.  Neurological:     Mental Status: He is alert and oriented to person, place, and time.     Deep Tendon Reflexes: Reflexes are normal and symmetric.  Psychiatric:        Behavior: Behavior normal.        Assessment & Plan:  Hakim Hann. is a 72 y.o. male . Annual physical exam  - -anticipatory guidance as below in AVS, screening labs above. Health maintenance items as above in HPI discussed/recommended as applicable.   Hyperbilirubinemia  -With normal CBC, path review, other LFTs normal.  Will continue to monitor but suspect Gilbert's syndrome.  Hyperlipidemia, unspecified hyperlipidemia type - Plan: Comprehensive metabolic panel, Lipid panel  -Tolerating Lipitor, continue same.  Check monitoring labs.  Insomnia, unspecified type  -Trial of half dosing of trazodone to see if that is just as effective.  Possible association with soft stools but less likely  cause.  Fiber in diet discussed, RTC precautions if persistent soft stools or any new/worsening symptoms.  No orders of the defined types were placed in this encounter.  Patient Instructions  Try 1/2 pill trazodone to see if that is effective for sleep. Follow up if soft stool persists. You can try a fiber supplement like citrucel or metamucil is an option if needed.  No other med changes at this time.  Tetanus booster at pharmacy after 10/14. Check into coverage/cost first.  Other tests looked okay so I am not concerned about the elevated bilirubin but I will check that again on your labs today as well as your cholesterol levels and other screening labs.  Continue same dose atorvastatin for now.  Follow-up in 6 months but please let me know if there are questions in the meantime.  Glad you are healing up from your surgery.  Hang in there.  Preventive Care 59 Years and Older, Male Preventive care refers to lifestyle choices and visits with your health care provider that can promote health and wellness. Preventive care visits are also called wellness exams. What can I expect for my preventive care visit? Counseling During your preventive care visit, your health care provider may ask about your: Medical history, including: Past medical problems. Family medical history. History of falls. Current health,  including: Emotional well-being. Home life and relationship well-being. Sexual activity. Memory and ability to understand (cognition). Lifestyle, including: Alcohol, nicotine or tobacco, and drug use. Access to firearms. Diet, exercise, and sleep habits. Work and work Astronomer. Sunscreen use. Safety issues such as seatbelt and bike helmet use. Physical exam Your health care provider will check your: Height and weight. These may be used to calculate your BMI (body mass index). BMI is a measurement that tells if you are at a healthy weight. Waist circumference. This measures the  distance around your waistline. This measurement also tells if you are at a healthy weight and may help predict your risk of certain diseases, such as type 2 diabetes and high blood pressure. Heart rate and blood pressure. Body temperature. Skin for abnormal spots. What immunizations do I need?  Vaccines are usually given at various ages, according to a schedule. Your health care provider will recommend vaccines for you based on your age, medical history, and lifestyle or other factors, such as travel or where you work. What tests do I need? Screening Your health care provider may recommend screening tests for certain conditions. This may include: Lipid and cholesterol levels. Diabetes screening. This is done by checking your blood sugar (glucose) after you have not eaten for a while (fasting). Hepatitis C test. Hepatitis B test. HIV (human immunodeficiency virus) test. STI (sexually transmitted infection) testing, if you are at risk. Lung cancer screening. Colorectal cancer screening. Prostate cancer screening. Abdominal aortic aneurysm (AAA) screening. You may need this if you are a current or former smoker. Talk with your health care provider about your test results, treatment options, and if necessary, the need for more tests. Follow these instructions at home: Eating and drinking  Eat a diet that includes fresh fruits and vegetables, whole grains, lean protein, and low-fat dairy products. Limit your intake of foods with high amounts of sugar, saturated fats, and salt. Take vitamin and mineral supplements as recommended by your health care provider. Do not drink alcohol if your health care provider tells you not to drink. If you drink alcohol: Limit how much you have to 0-2 drinks a day. Know how much alcohol is in your drink. In the [place] one drink equals one 12 oz bottle of beer (355 mL), one 5 oz glass of wine (148 mL), or one 1 oz glass of hard liquor (44 mL). Lifestyle Brush  your teeth every morning and night with fluoride toothpaste. Floss one time each day. Exercise for at least 30 minutes 5 or more days each week. Do not use any products that contain nicotine or tobacco. These products include cigarettes, chewing tobacco, and vaping devices, such as e-cigarettes. If you need help quitting, ask your health care provider. Do not use drugs. If you are sexually active, practice safe sex. Use a condom or other form of protection to prevent STIs. Take aspirin only as told by your health care provider. Make sure that you understand how much to take and what form to take. Work with your health care provider to find out whether it is safe and beneficial for you to take aspirin daily. Ask your health care provider if you need to take a cholesterol-lowering medicine (statin). Find healthy ways to manage stress, such as: Meditation, yoga, or listening to music. Journaling. Talking to a trusted person. Spending time with friends and family. Safety Always wear your seat belt while driving or riding in a vehicle. Do not drive: If you have been drinking alcohol.  Do not ride with someone who has been drinking. When you are tired or distracted. While texting. If you have been using any mind-altering substances or drugs. Wear a helmet and other protective equipment during sports activities. If you have firearms in your house, make sure you follow all gun safety procedures. Minimize exposure to UV radiation to reduce your risk of skin cancer. What's next? Visit your health care provider once a year for an annual wellness visit. Ask your health care provider how often you should have your eyes and teeth checked. Stay up to date on all vaccines. This information is not intended to replace advice given to you by your health care provider. Make sure you discuss any questions you have with your health care provider. Document Revised: 11/18/2020 Document Reviewed:  11/18/2020 Elsevier Patient Education  2024 [employer]     Signed,   Meredith Staggers, MD [facility], [facility] 03/01/23 10:05 AM

## 2023-03-06 ENCOUNTER — Other Ambulatory Visit: Payer: Self-pay

## 2023-03-06 DIAGNOSIS — R7989 Other specified abnormal findings of blood chemistry: Secondary | ICD-10-CM

## 2023-03-14 ENCOUNTER — Other Ambulatory Visit (INDEPENDENT_AMBULATORY_CARE_PROVIDER_SITE_OTHER): Payer: Medicare HMO

## 2023-03-14 DIAGNOSIS — R7989 Other specified abnormal findings of blood chemistry: Secondary | ICD-10-CM

## 2023-03-14 LAB — HEPATIC FUNCTION PANEL
ALT: 34 U/L (ref 0–53)
AST: 25 U/L (ref 0–37)
Albumin: 4.3 g/dL (ref 3.5–5.2)
Alkaline Phosphatase: 63 U/L (ref 39–117)
Bilirubin, Direct: 0.2 mg/dL (ref 0.0–0.3)
Total Bilirubin: 1.3 mg/dL — ABNORMAL HIGH (ref 0.2–1.2)
Total Protein: 6.3 g/dL (ref 6.0–8.3)

## 2023-03-16 ENCOUNTER — Ambulatory Visit: Payer: Medicare HMO | Admitting: *Deleted

## 2023-03-16 DIAGNOSIS — Z0000 Encounter for general adult medical examination without abnormal findings: Secondary | ICD-10-CM | POA: Diagnosis not present

## 2023-03-16 NOTE — Patient Instructions (Signed)
Mr. Rodney Bailey , Thank you for taking time to come for your Medicare Wellness Visit. I appreciate your ongoing commitment to your health goals. Please review the following plan we discussed and let me know if I can assist you in the future.   Screening recommendations/referrals: Colonoscopy: up to date Recommended yearly ophthalmology/optometry visit for glaucoma screening and checkup Recommended yearly dental visit for hygiene and checkup  Vaccinations: Influenza vaccine: up to date Pneumococcal vaccine: up to date Tdap vaccine: Education provided Shingles vaccine: up to date    Advanced directives: yes     Preventive Care 65 Years and Older, Male Preventive care refers to lifestyle choices and visits with your health care provider that can promote health and wellness. What does preventive care include? A yearly physical exam. This is also called an annual well check. Dental exams once or twice a year. Routine eye exams. Ask your health care provider how often you should have your eyes checked. Personal lifestyle choices, including: Daily care of your teeth and gums. Regular physical activity. Eating a healthy diet. Avoiding tobacco and drug use. Limiting alcohol use. Practicing safe sex. Taking low doses of aspirin every day. Taking vitamin and mineral supplements as recommended by your health care provider. What happens during an annual well check? The services and screenings done by your health care provider during your annual well check will depend on your age, overall health, lifestyle risk factors, and family history of disease. Counseling  Your health care provider may ask you questions about your: Alcohol use. Tobacco use. Drug use. Emotional well-being. Home and relationship well-being. Sexual activity. Eating habits. History of falls. Memory and ability to understand (cognition). Work and work Astronomer. Screening  You may have the following tests or  measurements: Height, weight, and BMI. Blood pressure. Lipid and cholesterol levels. These may be checked every 5 years, or more frequently if you are over 4 years old. Skin check. Lung cancer screening. You may have this screening every year starting at age 32 if you have a 30-pack-year history of smoking and currently smoke or have quit within the past 15 years. Fecal occult blood test (FOBT) of the stool. You may have this test every year starting at age 61. Flexible sigmoidoscopy or colonoscopy. You may have a sigmoidoscopy every 5 years or a colonoscopy every 10 years starting at age 58. Prostate cancer screening. Recommendations will vary depending on your family history and other risks. Hepatitis C blood test. Hepatitis B blood test. Sexually transmitted disease (STD) testing. Diabetes screening. This is done by checking your blood sugar (glucose) after you have not eaten for a while (fasting). You may have this done every 1-3 years. Abdominal aortic aneurysm (AAA) screening. You may need this if you are a current or former smoker. Osteoporosis. You may be screened starting at age 47 if you are at high risk. Talk with your health care provider about your test results, treatment options, and if necessary, the need for more tests. Vaccines  Your health care provider may recommend certain vaccines, such as: Influenza vaccine. This is recommended every year. Tetanus, diphtheria, and acellular pertussis (Tdap, Td) vaccine. You may need a Td booster every 10 years. Zoster vaccine. You may need this after age 80. Pneumococcal 13-valent conjugate (PCV13) vaccine. One dose is recommended after age 61. Pneumococcal polysaccharide (PPSV23) vaccine. One dose is recommended after age 60. Talk to your health care provider about which screenings and vaccines you need and how often you need them. This  information is not intended to replace advice given to you by your health care provider. Make sure  you discuss any questions you have with your health care provider. Document Released: 06/19/2015 Document Revised: 02/10/2016 Document Reviewed: 03/24/2015 Elsevier Interactive Patient Education  2017 [employer].  Fall Prevention in the Home Falls can cause injuries. They can happen to people of all ages. There are many things you can do to make your home safe and to help prevent falls. What can I do on the outside of my home? Regularly fix the edges of walkways and driveways and fix any cracks. Remove anything that might make you trip as you walk through a door, such as a raised step or threshold. Trim any bushes or trees on the path to your home. Use bright outdoor lighting. Clear any walking paths of anything that might make someone trip, such as rocks or tools. Regularly check to see if handrails are loose or broken. Make sure that both sides of any steps have handrails. Any raised decks and porches should have guardrails on the edges. Have any leaves, snow, or ice cleared regularly. Use sand or salt on walking paths during winter. Clean up any spills in your garage right away. This includes oil or grease spills. What can I do in the bathroom? Use night lights. Install grab bars by the toilet and in the tub and shower. Do not use towel bars as grab bars. Use non-skid mats or decals in the tub or shower. If you need to sit down in the shower, use a plastic, non-slip stool. Keep the floor dry. Clean up any water that spills on the floor as soon as it happens. Remove soap buildup in the tub or shower regularly. Attach bath mats securely with double-sided non-slip rug tape. Do not have throw rugs and other things on the floor that can make you trip. What can I do in the bedroom? Use night lights. Make sure that you have a light by your bed that is easy to reach. Do not use any sheets or blankets that are too big for your bed. They should not hang down onto the floor. Have a firm  chair that has side arms. You can use this for support while you get dressed. Do not have throw rugs and other things on the floor that can make you trip. What can I do in the kitchen? Clean up any spills right away. Avoid walking on wet floors. Keep items that you use a lot in easy-to-reach places. If you need to reach something above you, use a strong step stool that has a grab bar. Keep electrical cords out of the way. Do not use floor polish or wax that makes floors slippery. If you must use wax, use non-skid floor wax. Do not have throw rugs and other things on the floor that can make you trip. What can I do with my stairs? Do not leave any items on the stairs. Make sure that there are handrails on both sides of the stairs and use them. Fix handrails that are broken or loose. Make sure that handrails are as long as the stairways. Check any carpeting to make sure that it is firmly attached to the stairs. Fix any carpet that is loose or worn. Avoid having throw rugs at the top or bottom of the stairs. If you do have throw rugs, attach them to the floor with carpet tape. Make sure that you have a light switch at the top  of the stairs and the bottom of the stairs. If you do not have them, ask someone to add them for you. What else can I do to help prevent falls? Wear shoes that: Do not have high heels. Have rubber bottoms. Are comfortable and fit you well. Are closed at the toe. Do not wear sandals. If you use a stepladder: Make sure that it is fully opened. Do not climb a closed stepladder. Make sure that both sides of the stepladder are locked into place. Ask someone to hold it for you, if possible. Clearly mark and make sure that you can see: Any grab bars or handrails. First and last steps. Where the edge of each step is. Use tools that help you move around (mobility aids) if they are needed. These include: Canes. Walkers. Scooters. Crutches. Turn on the lights when you go  into a dark area. Replace any light bulbs as soon as they burn out. Set up your furniture so you have a clear path. Avoid moving your furniture around. If any of your floors are uneven, fix them. If there are any pets around you, be aware of where they are. Review your medicines with your doctor. Some medicines can make you feel dizzy. This can increase your chance of falling. Ask your doctor what other things that you can do to help prevent falls. This information is not intended to replace advice given to you by your health care provider. Make sure you discuss any questions you have with your health care provider. Document Released: 03/19/2009 Document Revised: 10/29/2015 Document Reviewed: 06/27/2014 Elsevier Interactive Patient Education  2017 [employer].

## 2023-03-16 NOTE — Progress Notes (Signed)
Subjective:   Rodney Bailey. is a 72 y.o. male who presents for Medicare Annual/Subsequent preventive examination.  Visit Complete: Virtual I connected with  Glee Arvin. on 03/16/23 by a audio enabled telemedicine application and verified that I am speaking with the correct person using two identifiers.  Patient Location: Home  Provider Location: Home Office  I discussed the limitations of evaluation and management by telemedicine. The patient expressed understanding and agreed to proceed.  Vital Signs: Because this visit was a virtual/telehealth visit, some criteria may be missing or patient reported. Any vitals not documented were not able to be obtained and vitals that have been documented are patient reported.  Cardiac Risk Factors include: advanced age (>53men, >66 women);male gender;family history of premature cardiovascular disease     Objective:    There were no vitals filed for this visit. There is no height or weight on file to calculate BMI.     03/16/2023    2:06 PM 02/13/2023   11:02 AM 03/08/2022    9:52 AM 01/27/2022    9:58 AM 01/18/2021    8:30 AM 01/28/2020   11:13 AM 05/29/2019    6:32 AM  Advanced Directives  Does Patient Have a Medical Advance Directive? Yes Yes;No Yes Yes Yes Yes No  Type of Advance Directive Living will;Healthcare Power of [employer] Power of [place];Living will Healthcare Power of [place];Living will Healthcare Power of [place];Living will Healthcare Power of [place];Living will Healthcare Power of [place];Living will   Does patient want to make changes to medical advance directive?   No - Patient declined      Copy of Healthcare Power of Attorney in Chart? No - copy requested No - copy requested Yes - validated most recent copy scanned in chart (See row information)      Would patient like information on creating a medical advance directive?       No - Patient declined    Current Medications (verified) Outpatient  Encounter Medications as of 03/16/2023  Medication Sig   Ascorbic Acid (VITAMIN C) 1000 MG tablet Take 1,000 mg by mouth daily.   atorvastatin (LIPITOR) 10 MG tablet Take 1 tablet (10 mg total) by mouth daily.   Benfotiamine 150 MG CAPS Take 150 mg by mouth daily.   Cholecalciferol (VITAMIN D-3) 125 MCG (5000 UT) TABS Take 5,000 Units by mouth daily.   GLUCOSAMINE PO Take 1,500 mg by mouth daily.    Magnesium 400 MG TABS Take 400 mg by mouth 2 (two) times daily.   Melatonin 5 MG TABS Take 5-10 mg by mouth at bedtime.   Misc Natural Products (PROSTATE SUPPORT PO) Take 3 tablets by mouth daily.   OVER THE COUNTER MEDICATION Take 280 mg by mouth daily. [place]   SAW PALMETTO, SERENOA REPENS, PO Take 800 mg by mouth daily.    traZODone (DESYREL) 100 MG tablet TAKE 1/2 TO 1 TABLET BY MOUTH AT BEDTIME AT BEDTIME AS NEEDED FOR SLEEP   TURMERIC PO Take 1,200 mg by mouth daily.    fluticasone (FLONASE) 50 MCG/ACT nasal spray Place 2 sprays into both nostrils daily. (Patient taking differently: Place 2 sprays into both nostrils daily as needed for allergies.)   oxyCODONE (ROXICODONE) 5 MG immediate release tablet Take 1 tablet (5 mg total) by mouth every 8 (eight) hours as needed.   No facility-administered encounter medications on file as of 03/16/2023.    Allergies (verified) Patient has no known allergies.   History: Past Medical  History:  Diagnosis Date   Allergy    Arthritis    Past Surgical History:  Procedure Laterality Date   INSERTION OF MESH Bilateral 02/21/2023   Procedure: INSERTION OF MESH;  Surgeon: Moise Boring, MD;  Location: [facility] OR;  Service: General;  Laterality: Bilateral;   TOTAL HIP ARTHROPLASTY Left 05/29/2019   Procedure: TOTAL HIP ARTHROPLASTY ANTERIOR APPROACH SAME DAY DISCHARGE;  Surgeon: Ollen Gross, MD;  Location: [facility] ORS;  Service: Orthopedics;  Laterality: Left;    TOTAL SHOULDER ARTHROPLASTY Left 02/13/2020   Procedure: TOTAL SHOULDER  ARTHROPLASTY;  Surgeon: Francena Hanly, MD;  Location: [facility] OR[facility];  Service: Orthopedics;  Laterality: Left;    Family History  Problem Relation Age of Onset   Heart disease Mother        CHF   Stroke Father    Social History   Socioeconomic History   Marital status: Married    Spouse name: Not on file   Number of children: Not on file   Years of education: Not on file   Highest education level: Not on file  Occupational History   Not on file  Tobacco Use   Smoking status: Never   Smokeless tobacco: Never  Vaping Use   Vaping status: Never Used  Substance and Sexual Activity   Alcohol use: No   Drug use: No   Sexual activity: Yes    Birth control/protection: None    Comment: SEX PARTNERS IN THE LAST 12 MONTHS 1 AND CURRENT BIRTH CONTROL - NOT NEEDED  Other Topics Concern   Not on file  Social History Narrative   EXERCISE WALKING AND WEIGHT TRAINING 5-6 DAYS/WEEK FOR 30 MINUTES   His wife died 03-16-2017   Social Determinants of Health   Financial Resource Strain: Low Risk  (03/16/2023)   Overall Financial Resource Strain (CARDIA)    Difficulty of Paying Living Expenses: Not hard at all  Food Insecurity: No Food Insecurity (03/16/2023)   Hunger Vital Sign    Worried About Running Out of Food in the Last Year: Never true    Ran Out of Food in the Last Year: Never true  Transportation Needs: No Transportation Needs (03/16/2023)   PRAPARE - Administrator, Civil Service (Medical): No    Lack of Transportation (Non-Medical): No  Physical Activity: Sufficiently Active (03/16/2023)   Exercise Vital Sign    Days of Exercise per Week: 4 days    Minutes of Exercise per Session: 40 min  Stress: No Stress Concern Present (03/16/2023)   [employer] of Occupational Health - Occupational Stress Questionnaire    Feeling of Stress : Not at all  Social Connections: Moderately Integrated (03/16/2023)   Social Connection and Isolation Panel [NHANES]    Frequency  of Communication with Friends and Family: More than three times a week    Frequency of Social Gatherings with Friends and Family: Once a week    Attends Religious Services: More than 4 times per year    Active Member of [employer] or Organizations: No    Attends Engineer, structural: Never    Marital Status: Married    Tobacco Counseling Counseling given: Not Answered   Clinical Intake:  Pre-visit preparation completed: Yes  Pain : No/denies pain     Diabetes: No  How often do you need to have someone help you when you read instructions, pamphlets, or other written materials from your doctor or pharmacy?: 1 - Never  Interpreter Needed?: No  Information entered by :: Remi Haggard LPN   Activities of Daily Living    03/16/2023    2:06 PM 02/13/2023   11:05 AM  In your present state of health, do you have any difficulty performing the following activities:  Hearing? 1   Vision? 0   Difficulty concentrating or making decisions? 0   Walking or climbing stairs? 0   Dressing or bathing? 0   Doing errands, shopping? 0 0  Preparing Food and eating ? N   Using the Toilet? N   In the past six months, have you accidently leaked urine? N   Do you have problems with loss of bowel control? N   Managing your Medications? N   Managing your Finances? N   Housekeeping or managing your Housekeeping? N     Patient Care Team: Shade Flood, MD as PCP - General (Family Medicine) Janalyn Harder, MD (Inactive) as Consulting Physician (Dermatology) Francena Hanly, MD as Consulting Physician (Orthopedic Surgery)  Indicate any recent Medical Services you may have received from other than [facility] providers in the past year (date may be approximate).     Assessment:   This is a routine wellness examination for [employer].  Hearing/Vision screen Hearing Screening - Comments:: Bilateral hearing aids Vision Screening - Comments:: Up to date Hyacinth Meeker   Goals Addressed             This  Visit's Progress    Increase physical activity         Depression Screen    03/16/2023    2:08 PM 03/01/2023    9:27 AM 01/11/2023    2:11 PM 09/09/2022    9:47 AM 09/06/2022   10:41 AM 08/18/2022    2:22 PM 03/08/2022    9:51 AM  PHQ 2/9 Scores  PHQ - 2 Score 0 1 0 0 0 0 0  PHQ- 9 Score 2 1 1  2 2  0    Fall Risk    03/16/2023    2:03 PM 03/01/2023    9:27 AM 01/11/2023    2:10 PM 09/09/2022    9:47 AM 09/06/2022   10:41 AM  Fall Risk   Falls in the past year? 0 0 0 0 0  Number falls in past yr: 0 0 0 0 0  Injury with Fall? 0 0 0 0 0  Risk for fall due to :   No Fall Risks No Fall Risks No Fall Risks  Follow up Falls evaluation completed;Education provided;Falls prevention discussed Falls evaluation completed Falls evaluation completed  Falls evaluation completed    MEDICARE RISK AT HOME: Medicare Risk at Home Any stairs in or around the home?: No If so, are there any without handrails?: No Home free of loose throw rugs in walkways, pet beds, electrical cords, etc?: Yes Adequate lighting in your home to reduce risk of falls?: Yes Life alert?: No Use of a cane, walker or w/c?: No Grab bars in the bathroom?: Yes Shower chair or bench in shower?: No Elevated toilet seat or a handicapped toilet?: Yes  TIMED UP AND GO:  Was the test performed?  No    Cognitive Function:        03/16/2023    2:09 PM 03/08/2022    9:52 AM 01/27/2022    9:27 AM 01/18/2021    8:40 AM 01/18/2021    8:26 AM  6CIT Screen  What Year? 0 points 0 points 0 points 0 points 0 points  What month? 0 points  0 points 0 points 0 points 0 points  What time? 0 points 0 points 0 points 0 points 0 points  Count back from 20 2 points 0 points 0 points 0 points 0 points  Months in reverse 0 points 0 points 0 points 0 points 0 points  Repeat phrase 4 points 0 points 0 points 2 points 4 points  Total Score 6 points 0 points 0 points 2 points 4 points    Immunizations Immunization History  Administered Date(s)  Administered   Fluad Quad(high Dose 65+) 01/19/2019   Influenza, High Dose Seasonal PF 02/27/2018, 01/29/2023   Influenza,inj,Quad PF,6+ Mos 03/28/2017   Influenza,inj,quad, With Preservative 03/07/2019   Influenza-Unspecified 02/27/2018   PFIZER(Purple Top)SARS-COV-2 Vaccination 07/15/2019, 08/09/2019, 03/27/2020, 01/29/2023   Pneumococcal Conjugate-13 03/10/2017   Pneumococcal Polysaccharide-23 11/11/2019   Tdap 03/19/2013   Zoster Recombinant(Shingrix) 01/19/2019, 04/08/2019    TDAP status: Up to date  Flu Vaccine status: Up to date  Pneumococcal vaccine status: Up to date  Covid-19 vaccine status: Completed vaccines  Qualifies for Shingles Vaccine? No   Zostavax completed Yes   Shingrix Completed?: Yes  Screening Tests Health Maintenance  Topic Date Due   DTaP/Tdap/Td (2 - Td or Tdap) 03/20/2023   COVID-19 Vaccine (5 - 2023-24 season) 03/26/2023   Medicare Annual Wellness (AWV)  03/15/2024   Colonoscopy  06/03/2031   Pneumonia Vaccine 2+ Years old  Completed   INFLUENZA VACCINE  Completed   Hepatitis C Screening  Completed   Zoster Vaccines- Shingrix  Completed   HPV VACCINES  Aged Out    Health Maintenance  There are no preventive care reminders to display for this patient.    Colorectal cancer screening: Type of screening: Colonoscopy. Completed 2022. Repeat every 5 years  Lung Cancer Screening: (Low Dose CT Chest recommended if Age 72-80 years, 20 pack-year currently smoking OR have quit w/in 15years.) does not qualify.   Lung Cancer Screening Referral:   Additional Screening:  Hepatitis C Screening: does not qualify; Completed 2019  Vision Screening: Recommended annual ophthalmology exams for early detection of glaucoma and other disorders of the eye. Is the patient up to date with their annual eye exam?  Yes  Who is the provider or what is the name of the office in which the patient attends annual eye exams? [facility] If pt is not established with a  provider, would they like to be referred to a provider to establish care? No .   Dental Screening: Recommended annual dental exams for proper oral hygiene   Community Resource Referral / Chronic Care Management: CRR required this visit?  No   CCM required this visit?  No     Plan:     I have personally reviewed and noted the following in the patient's chart:   Medical and social history Use of alcohol, tobacco or illicit drugs  Current medications and supplements including opioid prescriptions. Patient is not currently taking opioid prescriptions. Functional ability and status Nutritional status Physical activity Advanced directives List of other physicians Hospitalizations, surgeries, and ER visits in previous 12 months Vitals Screenings to include cognitive, depression, and falls Referrals and appointments  In addition, I have reviewed and discussed with patient certain preventive protocols, quality metrics, and best practice recommendations. A written personalized care plan for preventive services as well as general preventive health recommendations were provided to patient.     Remi Haggard, LPN   [id number]/[id number]   After Visit Summary: (MyChart) Due to this being a telephonic visit,  the after visit summary with patients personalized plan was offered to patient via MyChart   Nurse Notes:

## 2023-07-07 ENCOUNTER — Other Ambulatory Visit: Payer: Self-pay | Admitting: Family Medicine

## 2023-07-07 DIAGNOSIS — G4700 Insomnia, unspecified: Secondary | ICD-10-CM

## 2023-07-07 NOTE — Telephone Encounter (Signed)
Physical in 02/2023.  Trazodone discussed at that time.  Refill ordered.

## 2023-07-07 NOTE — Telephone Encounter (Signed)
Requested Prescriptions   Pending Prescriptions Disp Refills   traZODone (DESYREL) 100 MG tablet [Pharmacy Med Name: traZODone 100 MG TABLET] 90 tablet 3    Sig: TAKE 1/2 TO 1 TABLET BY MOUTH AT BEDTIME AS NEEDED FOR SLEEP     Date of patient request: 07/07/2023 Last office visit: 03/01/2023 Upcoming visit: 08/30/2023 Date of last refill: 07/15/2022 Last refill amount: 90 x3

## 2023-08-30 ENCOUNTER — Encounter: Payer: Self-pay | Admitting: Family Medicine

## 2023-08-30 ENCOUNTER — Ambulatory Visit (INDEPENDENT_AMBULATORY_CARE_PROVIDER_SITE_OTHER): Payer: Medicare HMO | Admitting: Family Medicine

## 2023-08-30 VITALS — BP 122/60 | HR 87 | Temp 98.2°F | Ht 65.5 in | Wt 167.0 lb

## 2023-08-30 DIAGNOSIS — E785 Hyperlipidemia, unspecified: Secondary | ICD-10-CM

## 2023-08-30 DIAGNOSIS — J309 Allergic rhinitis, unspecified: Secondary | ICD-10-CM | POA: Diagnosis not present

## 2023-08-30 DIAGNOSIS — Z9889 Other specified postprocedural states: Secondary | ICD-10-CM

## 2023-08-30 DIAGNOSIS — G4700 Insomnia, unspecified: Secondary | ICD-10-CM | POA: Diagnosis not present

## 2023-08-30 DIAGNOSIS — Z8719 Personal history of other diseases of the digestive system: Secondary | ICD-10-CM

## 2023-08-30 MED ORDER — ATORVASTATIN CALCIUM 10 MG PO TABS: 10.0000 mg | ORAL_TABLET | Freq: Every day | ORAL | 2 refills | Status: DC

## 2023-08-30 NOTE — Patient Instructions (Signed)
 I do not appreciate any hernia on my exam today, but if there is a concern about the area of prior repair, I recommend following up with your surgeon.  No change in medications for me today, I will check labs and if any concerns will let you know.  Take care!

## 2023-08-30 NOTE — Progress Notes (Signed)
 Subjective:  Patient ID: Rodney Bailey., male    DOB: 03/22/1951  Age: 73 y.o. MRN: [id number]  CC:  Chief Complaint  Patient presents with   Medical Management of Chronic Issues    Pt is doing well no concerns at this time     HPI Rodney Bailey. presents for   Hyperlipidemia: Lipitor 10 mg daily.  Suspected Gilbert's syndrome with isolated hyperbilirubinemia, with other LFTs normal.  Temporary elevation, then improved.  No new myalgias or side effects with current med regimen. Last ate 830 this morning.   Hernia repair - bilat inguinal hernia repair in 02/2023. Fullness in lower groin/abdomen - no new bulge/pain.   Lab Results  Component Value Date   CHOL 197 03/01/2023   HDL 43.60 03/01/2023   LDLCALC 129 (H) 03/01/2023   TRIG 125.0 03/01/2023   CHOLHDL 5 03/01/2023   Lab Results  Component Value Date   ALT 34 03/14/2023   AST 25 03/14/2023   ALKPHOS 63 03/14/2023   BILITOT 1.3 (H) 03/14/2023   Allergic rhinitis Rare need of Flonase previously.  Working well at that time.  Still working well, with intermittent use.   Insomnia Trazodone 100 mg nightly, effective.  No side effects.    History Patient Active Problem List   Diagnosis Date Noted   Osteoarthritis of left glenohumeral joint 10/03/2019   OA (osteoarthritis) of hip 05/29/2019   Other specified arthritis, left hip 05/29/2019   Pain of left hip joint 02/22/2019   Need for influenza vaccination 03/28/2017   Injury of toe on right foot 03/28/2017   Subungual hematoma of great toe of right foot 03/28/2017   Insomnia 11/02/2016   Nocturia 11/03/2015   Hematuria 04/26/2014   Past Medical History:  Diagnosis Date   Allergy    Arthritis    Past Surgical History:  Procedure Laterality Date   INSERTION OF MESH Bilateral 02/21/2023   Procedure: INSERTION OF MESH;  Surgeon: Moise Boring, MD;  Location: [facility] OR;  Service: General;  Laterality: Bilateral;   TOTAL HIP ARTHROPLASTY Left 05/29/2019    Procedure: TOTAL HIP ARTHROPLASTY ANTERIOR APPROACH SAME DAY DISCHARGE;  Surgeon: Ollen Gross, MD;  Location: [facility] ORS;  Service: Orthopedics;  Laterality: Left;    TOTAL SHOULDER ARTHROPLASTY Left 02/13/2020   Procedure: TOTAL SHOULDER ARTHROPLASTY;  Surgeon: Francena Hanly, MD;  Location: [facility] OR[facility];  Service: Orthopedics;  Laterality: Left;    No Known Allergies Prior to Admission medications   Medication Sig Start Date End Date Taking? Authorizing Provider  Ascorbic Acid (VITAMIN C) 1000 MG tablet Take 1,000 mg by mouth daily.   Yes [provider]  atorvastatin (LIPITOR) 10 MG tablet Take 1 tablet (10 mg total) by mouth daily. 03/01/23  Yes Shade Flood, MD  Benfotiamine 150 MG CAPS Take 150 mg by mouth daily.   Yes [provider]  Cholecalciferol (VITAMIN D-3) 125 MCG (5000 UT) TABS Take 5,000 Units by mouth daily.   Yes [provider]  GLUCOSAMINE PO Take 1,500 mg by mouth daily.    Yes [provider]  Magnesium 400 MG TABS Take 400 mg by mouth 2 (two) times daily.   Yes [provider]  Melatonin 5 MG TABS Take 5-10 mg by mouth at bedtime.   Yes [provider]  Misc Natural Products (PROSTATE SUPPORT PO) Take 3 tablets by mouth daily.   Yes [provider]  OVER THE COUNTER MEDICATION Take 280 mg by  mouth daily. Horsetail   Yes [provider]  oxyCODONE (ROXICODONE) 5 MG immediate release tablet Take 1 tablet (5 mg total) by mouth every 8 (eight) hours as needed. 02/21/23 02/21/24 Yes Metzger, Lucilla Edin, MD  SAW PALMETTO, SERENOA REPENS, PO Take 800 mg by mouth daily.    Yes [provider]  traZODone (DESYREL) 100 MG tablet TAKE 1/2 TO 1 TABLET BY MOUTH AT BEDTIME AS NEEDED FOR SLEEP 07/07/23  Yes Shade Flood, MD  TURMERIC PO Take 1,200 mg by mouth daily.    Yes [provider]  fluticasone (FLONASE) 50 MCG/ACT nasal spray Place 2 sprays into both nostrils daily. Patient  taking differently: Place 2 sprays into both nostrils daily as needed for allergies. 04/05/21 03/01/23  Theadora Rama Scales, PA-C   Social History   Socioeconomic History   Marital status: Married    Spouse name: Not on file   Number of children: Not on file   Years of education: Not on file   Highest education level: Not on file  Occupational History   Not on file  Tobacco Use   Smoking status: Never   Smokeless tobacco: Never  Vaping Use   Vaping status: Never Used  Substance and Sexual Activity   Alcohol use: No   Drug use: No   Sexual activity: Yes    Birth control/protection: None    Comment: SEX PARTNERS IN THE LAST 12 MONTHS 1 AND CURRENT BIRTH CONTROL - NOT NEEDED  Other Topics Concern   Not on file  Social History Narrative   EXERCISE WALKING AND WEIGHT TRAINING 5-6 DAYS/WEEK FOR 30 MINUTES   His wife died 03-21-17   Social Drivers of Health   Financial Resource Strain: Low Risk  (03/16/2023)   Overall Financial Resource Strain (CARDIA)    Difficulty of Paying Living Expenses: Not hard at all  Food Insecurity: No Food Insecurity (03/16/2023)   Hunger Vital Sign    Worried About Running Out of Food in the Last Year: Never true    Ran Out of Food in the Last Year: Never true  Transportation Needs: No Transportation Needs (03/16/2023)   PRAPARE - Administrator, Civil Service (Medical): No    Lack of Transportation (Non-Medical): No  Physical Activity: Sufficiently Active (03/16/2023)   Exercise Vital Sign    Days of Exercise per Week: 4 days    Minutes of Exercise per Session: 40 min  Stress: No Stress Concern Present (03/16/2023)   [employer] of Occupational Health - Occupational Stress Questionnaire    Feeling of Stress : Not at all  Social Connections: Moderately Integrated (03/16/2023)   Social Connection and Isolation Panel [NHANES]    Frequency of Communication with Friends and Family: More than three times a week    Frequency of  Social Gatherings with Friends and Family: Once a week    Attends Religious Services: More than 4 times per year    Active Member of [employer] or Organizations: No    Attends Banker Meetings: Never    Marital Status: Married  Catering manager Violence: Not At Risk (03/16/2023)   Humiliation, Afraid, Rape, and Kick questionnaire    Fear of Current or Ex-Partner: No    Emotionally Abused: No    Physically Abused: No    Sexually Abused: No    Review of Systems   Objective:   Vitals:   08/30/23 0931  BP: 122/60  Pulse: 87  Temp: 98.2 F (36.8 C)  TempSrc: Temporal  SpO2: 97%  Weight: 167 lb (75.8 kg)  Height: 5' 5.5" (1.664 m)     Physical Exam Vitals reviewed.  Constitutional:      Appearance: He is well-developed.  HENT:     Head: Normocephalic and atraumatic.  Neck:     Vascular: No carotid bruit or JVD.  Cardiovascular:     Rate and Rhythm: Normal rate and regular rhythm.     Heart sounds: Normal heart sounds. No murmur heard. Pulmonary:     Effort: Pulmonary effort is normal.     Breath sounds: Normal breath sounds. No rales.  Abdominal:     Hernia: No hernia is present.     Comments: Well-healed scar at umbilicus.  No inguinal hernia appreciated with cough, Valsalva.  Musculoskeletal:     Right lower leg: No edema.     Left lower leg: No edema.  Skin:    General: Skin is warm and dry.  Neurological:     Mental Status: He is alert and oriented to person, place, and time.  Psychiatric:        Mood and Affect: Mood normal.       Assessment & Plan:  Zebulen Simonis. is a 73 y.o. male . Insomnia, unspecified type  -Stable with current regimen, continue same.  Hyperlipidemia, unspecified hyperlipidemia type - Plan: atorvastatin (LIPITOR) 10 MG tablet, Comprehensive metabolic panel, Lipid panel  -Tolerating current dose Lipitor, check labs and adjust plan accordingly.  Allergic rhinitis, unspecified seasonality, unspecified  trigger  -Well-controlled with intermittent Flonase.  Continue same  History of inguinal hernia repair  -No appreciable hernia noted on exam.  Option to meet with surgeon if he has any persistent discomfort in the area but I do not appreciate a defect on exam currently.  Meds ordered this encounter  Medications   atorvastatin (LIPITOR) 10 MG tablet    Sig: Take 1 tablet (10 mg total) by mouth daily.    Dispense:  90 tablet    Refill:  2   Patient Instructions  I do not appreciate any hernia on my exam today, but if there is a concern about the area of prior repair, I recommend following up with your surgeon.  No change in medications for me today, I will check labs and if any concerns will let you know.  Take care!    Signed,   Meredith Staggers, MD [facility], [facility] 08/30/23 10:28 AM

## 2023-09-07 ENCOUNTER — Ambulatory Visit (INDEPENDENT_AMBULATORY_CARE_PROVIDER_SITE_OTHER)
Admission: RE | Admit: 2023-09-07 | Discharge: 2023-09-07 | Disposition: A | Discharge status: Home / Self Care | Source: Ambulatory Visit | Attending: Family Medicine | Admitting: Family Medicine

## 2023-09-07 ENCOUNTER — Ambulatory Visit (INDEPENDENT_AMBULATORY_CARE_PROVIDER_SITE_OTHER): Admitting: Family Medicine

## 2023-09-07 ENCOUNTER — Encounter: Payer: Self-pay | Admitting: Family Medicine

## 2023-09-07 VITALS — BP 128/80 | HR 67 | Temp 99.2°F | Ht 65.5 in | Wt 168.4 lb

## 2023-09-07 DIAGNOSIS — R0789 Other chest pain: Secondary | ICD-10-CM

## 2023-09-07 DIAGNOSIS — R0981 Nasal congestion: Secondary | ICD-10-CM | POA: Diagnosis not present

## 2023-09-07 DIAGNOSIS — R0781 Pleurodynia: Secondary | ICD-10-CM

## 2023-09-07 DIAGNOSIS — S20211A Contusion of right front wall of thorax, initial encounter: Secondary | ICD-10-CM

## 2023-09-07 MED ORDER — IPRATROPIUM BROMIDE 0.06 % NA SOLN
1.0000 | Freq: Four times a day (QID) | NASAL | 5 refills | Status: AC
Start: 2023-09-07 — End: ?

## 2023-09-07 MED ORDER — AMOXICILLIN-POT CLAVULANATE 875-125 MG PO TABS
1.0000 | ORAL_TABLET | Freq: Two times a day (BID) | ORAL | 0 refills | Status: DC
Start: 2023-09-07 — End: 2023-11-27
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
## 2023-09-08 ENCOUNTER — Encounter: Payer: Self-pay | Admitting: Family Medicine

## 2023-11-27 ENCOUNTER — Encounter: Payer: Self-pay | Admitting: Family Medicine

## 2023-11-27 ENCOUNTER — Ambulatory Visit (INDEPENDENT_AMBULATORY_CARE_PROVIDER_SITE_OTHER): Admitting: Family Medicine

## 2023-11-27 VITALS — BP 124/70 | HR 81 | Temp 99.6°F | Ht 65.5 in | Wt 165.4 lb

## 2023-11-27 DIAGNOSIS — R051 Acute cough: Secondary | ICD-10-CM

## 2023-11-27 DIAGNOSIS — R0981 Nasal congestion: Secondary | ICD-10-CM

## 2023-11-27 LAB — POC COVID19 BINAXNOW: SARS Coronavirus 2 Ag: NEGATIVE

## 2023-11-27 MED ORDER — AMOXICILLIN-POT CLAVULANATE 875-125 MG PO TABS: 1.0000 | ORAL_TABLET | Freq: Two times a day (BID) | ORAL | 0 refills | Status: AC

## 2023-11-27 NOTE — Patient Instructions (Signed)
 As we discussed you most likely have viral cause of nasal congestion at this time.  I would recommend continue Mucinex, Nasonex  nasal spray is fine but try adding saline nasal spray like Simply Saline or Ocean.  That can help with the nasal congestion along with drinking plenty of fluids, water  is best.  If the discolored nasal congestion, sinus pain and pressure is not improving in the next few days, start the Augmentin  that was printed today.  Be seen if any new or worsening symptoms.  Hang in there!

## 2023-11-27 NOTE — Progress Notes (Signed)
 Subjective:  Patient ID: Rodney Bailey., male    DOB: 1950/11/29  Age: 73 y.o. MRN: [id number]  CC:  Chief Complaint  Patient presents with   Sinus Problem    Pt notes has had a productive cough with discolored mucous, notes has had a cold for several days already     HPI [employer]. presents for   Cough: Cold symptoms past few days. Runny nose, slight congestion for a few days.  Wife has had a cold, no other sick contacts.  No home covid testing, no fever, no dyspnea. Min fatigue, eating /drinking ok.  Concerned d/t yellow-green mucus last night into today. Still discolored.  Tx: mucinex. Nasonex .   Did take augmentin  for sinus issues in April. No side effects.      History Patient Active Problem List   Diagnosis Date Noted   Osteoarthritis of left glenohumeral joint 10/03/2019   OA (osteoarthritis) of hip 05/29/2019   Other specified arthritis, left hip 05/29/2019   Pain of left hip joint 02/22/2019   Need for influenza vaccination 03/28/2017   Injury of toe on right foot 03/28/2017   Subungual hematoma of great toe of right foot 03/28/2017   Insomnia 11/02/2016   Nocturia 11/03/2015   Hematuria 04/26/2014   Past Medical History:  Diagnosis Date   Allergy    Arthritis    Past Surgical History:  Procedure Laterality Date   INSERTION OF MESH Bilateral 02/21/2023   Procedure: INSERTION OF MESH;  Surgeon: Polly Cordella LABOR, MD;  Location: [facility] OR;  Service: General;  Laterality: Bilateral;   TOTAL HIP ARTHROPLASTY Left 05/29/2019   Procedure: TOTAL HIP ARTHROPLASTY ANTERIOR APPROACH SAME DAY DISCHARGE;  Surgeon: Melodi Lerner, MD;  Location: [facility] ORS;  Service: Orthopedics;  Laterality: Left;    TOTAL SHOULDER ARTHROPLASTY Left 02/13/2020   Procedure: TOTAL SHOULDER ARTHROPLASTY;  Surgeon: Melita Drivers, MD;  Location: [facility] ORS;  Service: Orthopedics;  Laterality: Left;    No Known Allergies Prior to Admission medications   Medication Sig Start  Date End Date Taking? Authorizing Provider  amoxicillin -clavulanate (AUGMENTIN ) 875-125 MG tablet Take 1 tablet by mouth 2 (two) times daily. 09/07/23  Yes Levora Reyes SAUNDERS, MD  Ascorbic Acid (VITAMIN C) 1000 MG tablet Take 1,000 mg by mouth daily.   Yes [provider]  atorvastatin  (LIPITOR) 10 MG tablet Take 1 tablet (10 mg total) by mouth daily. 08/30/23  Yes Levora Reyes SAUNDERS, MD  Benfotiamine 150 MG CAPS Take 150 mg by mouth daily.   Yes [provider]  Cholecalciferol (VITAMIN D-3) 125 MCG (5000 UT) TABS Take 5,000 Units by mouth daily.   Yes [provider]  GLUCOSAMINE PO Take 1,500 mg by mouth daily.    Yes [provider]  ipratropium (ATROVENT ) 0.06 % nasal spray Place 1-2 sprays into both nostrils 4 (four) times daily. As needed for nasal congestion 09/07/23  Yes Levora Reyes SAUNDERS, MD  Magnesium 400 MG TABS Take 400 mg by mouth 2 (two) times daily.   Yes [provider]  Melatonin 5 MG TABS Take 5-10 mg by mouth at bedtime.   Yes [provider]  Misc Natural Products (PROSTATE SUPPORT PO) Take 3 tablets by mouth daily.   Yes [provider]  OVER THE COUNTER MEDICATION Take 280 mg by mouth daily. Horsetail   Yes [provider]  oxyCODONE  (ROXICODONE ) 5 MG immediate release tablet Take 1 tablet (5 mg total) by mouth every 8 (  eight) hours as needed. 02/21/23 02/21/24 Yes Metzger, Cordella LABOR, MD  SAW PALMETTO, SERENOA REPENS, PO Take 800 mg by mouth daily.    Yes [provider]  traZODone  (DESYREL ) 100 MG tablet TAKE 1/2 TO 1 TABLET BY MOUTH AT BEDTIME AS NEEDED FOR SLEEP 07/07/23  Yes Levora Reyes SAUNDERS, MD  TURMERIC PO Take 1,200 mg by mouth daily.    Yes [provider]  fluticasone  (FLONASE ) 50 MCG/ACT nasal spray Place 2 sprays into both nostrils daily. Patient taking differently: Place 2 sprays into both nostrils daily as needed for allergies. 04/05/21 09/07/23  Joesph Shaver Scales, PA-C   Social  History   Socioeconomic History   Marital status: Married    Spouse name: Not on file   Number of children: Not on file   Years of education: Not on file   Highest education level: Not on file  Occupational History   Not on file  Tobacco Use   Smoking status: Never   Smokeless tobacco: Never  Vaping Use   Vaping status: Never Used  Substance and Sexual Activity   Alcohol use: No   Drug use: No   Sexual activity: Yes    Birth control/protection: None    Comment: SEX PARTNERS IN THE LAST 12 MONTHS 1 AND CURRENT BIRTH CONTROL - NOT NEEDED  Other Topics Concern   Not on file  Social History Narrative   EXERCISE WALKING AND WEIGHT TRAINING 5-6 DAYS/WEEK FOR 30 MINUTES   His wife died 03-02-17   Social Drivers of Health   Financial Resource Strain: Low Risk  (03/16/2023)   Overall Financial Resource Strain (CARDIA)    Difficulty of Paying Living Expenses: Not hard at all  Food Insecurity: No Food Insecurity (03/16/2023)   Hunger Vital Sign    Worried About Running Out of Food in the Last Year: Never true    Ran Out of Food in the Last Year: Never true  Transportation Needs: No Transportation Needs (03/16/2023)   PRAPARE - Administrator, Civil Service (Medical): No    Lack of Transportation (Non-Medical): No  Physical Activity: Sufficiently Active (03/16/2023)   Exercise Vital Sign    Days of Exercise per Week: 4 days    Minutes of Exercise per Session: 40 min  Stress: No Stress Concern Present (03/16/2023)   [employer] of Occupational Health - Occupational Stress Questionnaire    Feeling of Stress : Not at all  Social Connections: Moderately Integrated (03/16/2023)   Social Connection and Isolation Panel    Frequency of Communication with Friends and Family: More than three times a week    Frequency of Social Gatherings with Friends and Family: Once a week    Attends Religious Services: More than 4 times per year    Active Member of [employer] or  Organizations: No    Attends Banker Meetings: Never    Marital Status: Married  Catering manager Violence: Not At Risk (03/16/2023)   Humiliation, Afraid, Rape, and Kick questionnaire    Fear of Current or Ex-Partner: No    Emotionally Abused: No    Physically Abused: No    Sexually Abused: No    Review of Systems Per HPI.   Objective:   Vitals:   11/27/23 1357  BP: 124/70  Pulse: 81  Temp: 99.6 F (37.6 C)  TempSrc: Temporal  SpO2: 96%  Weight: 165 lb 6.4 oz (75 kg)  Height: 5' 5.5 (1.664 m)     Physical Exam  Vitals reviewed.  Constitutional:      Appearance: He is well-developed.  HENT:     Head: Normocephalic and atraumatic.     Right Ear: Tympanic membrane, ear canal and external ear normal.     Left Ear: Tympanic membrane, ear canal and external ear normal.     Nose: No rhinorrhea.     Comments: Minimal tenderness with percussion of frontal and maxillary sinuses.    Mouth/Throat:     Pharynx: No oropharyngeal exudate or posterior oropharyngeal erythema.   Eyes:     Conjunctiva/sclera: Conjunctivae normal.     Pupils: Pupils are equal, round, and reactive to light.    Cardiovascular:     Rate and Rhythm: Normal rate and regular rhythm.     Heart sounds: Normal heart sounds. No murmur heard. Pulmonary:     Effort: Pulmonary effort is normal.     Breath sounds: Normal breath sounds. No wheezing, rhonchi or rales.  Abdominal:     Palpations: Abdomen is soft.     Tenderness: There is no abdominal tenderness.   Musculoskeletal:     Cervical back: Neck supple.  Lymphadenopathy:     Cervical: No cervical adenopathy.   Skin:    General: Skin is warm and dry.     Findings: No rash.   Neurological:     Mental Status: He is alert and oriented to person, place, and time.   Psychiatric:        Behavior: Behavior normal.     Results for orders placed or performed in visit on 11/27/23  POC COVID-19 BinaxNow   Collection Time: 11/27/23   3:38 PM  Result Value Ref Range   SARS Coronavirus 2 Ag Negative Negative      Assessment & Plan:  Marquan Vokes. is a 73 y.o. male . Nasal congestion - Plan: POC COVID-19 BinaxNow, amoxicillin -clavulanate (AUGMENTIN ) 875-125 MG tablet  Acute cough - Plan: POC COVID-19 BinaxNow, amoxicillin -clavulanate (AUGMENTIN ) 875-125 MG tablet  Suspected viral URI with possible early sinusitis, but based on timing of symptoms recommended saline nasal spray, Mucinex, continued symptomatic care with monitoring for improvement in the next few days.  Printed antibiotic Augmentin  if not improving but we discussed potential risks and side effects of the antibiotic.  RTC precautions given.  Meds ordered this encounter  Medications   amoxicillin -clavulanate (AUGMENTIN ) 875-125 MG tablet    Sig: Take 1 tablet by mouth 2 (two) times daily.    Dispense:  20 tablet    Refill:  0   Patient Instructions  As we discussed you most likely have viral cause of nasal congestion at this time.  I would recommend continue Mucinex, Nasonex  nasal spray is fine but try adding saline nasal spray like Simply Saline or Ocean.  That can help with the nasal congestion along with drinking plenty of fluids, water  is best.  If the discolored nasal congestion, sinus pain and pressure is not improving in the next few days, start the Augmentin  that was printed today.  Be seen if any new or worsening symptoms.  Hang in there!    Signed,   Reyes Pines, MD [facility], [facility] 11/27/23 5:40 PM

## 2023-12-07 DIAGNOSIS — M25562 Pain in left knee: Secondary | ICD-10-CM | POA: Diagnosis not present

## 2024-01-11 ENCOUNTER — Ambulatory Visit (INDEPENDENT_AMBULATORY_CARE_PROVIDER_SITE_OTHER): Admitting: Family Medicine

## 2024-01-11 ENCOUNTER — Encounter: Payer: Self-pay | Admitting: Family Medicine

## 2024-01-11 VITALS — BP 138/80 | HR 82 | Temp 98.2°F | Resp 20 | Ht 65.5 in | Wt 163.4 lb

## 2024-01-11 DIAGNOSIS — E785 Hyperlipidemia, unspecified: Secondary | ICD-10-CM | POA: Diagnosis not present

## 2024-01-11 DIAGNOSIS — R221 Localized swelling, mass and lump, neck: Secondary | ICD-10-CM | POA: Diagnosis not present

## 2024-01-11 DIAGNOSIS — R61 Generalized hyperhidrosis: Secondary | ICD-10-CM | POA: Diagnosis not present

## 2024-01-11 DIAGNOSIS — R413 Other amnesia: Secondary | ICD-10-CM | POA: Diagnosis not present

## 2024-01-11 LAB — CBC
HCT: 47.7 % (ref 39.0–52.0)
Hemoglobin: 16.1 g/dL (ref 13.0–17.0)
MCHC: 33.7 g/dL (ref 30.0–36.0)
MCV: 93.4 fl (ref 78.0–100.0)
Platelets: 236 K/uL (ref 150.0–400.0)
RBC: 5.1 Mil/uL (ref 4.22–5.81)
RDW: 13.3 % (ref 11.5–15.5)
WBC: 6.2 K/uL (ref 4.0–10.5)

## 2024-01-11 LAB — TSH: TSH: 1.08 u[IU]/mL (ref 0.35–5.50)

## 2024-01-11 LAB — SEDIMENTATION RATE: Sed Rate: 3 mm/h (ref 0–20)

## 2024-01-11 NOTE — Patient Instructions (Addendum)
 Thanks for coming in today.  Based on the area of your symptoms, I think you could have some irritation or possible spasm of the sternocleidomastoid muscle or SCM muscle.  I do not feel any swollen lymph nodes or masses, but I will order an ultrasound as well as some blood work with the history of occasional night sweats.  We have also checked the labs that were ordered in March.  Try some gentle range of motion throughout the day, change in pillow position may also be helpful and follow-up in the next few weeks if you do not notice an improvement in that area.  Sooner if any new or worsening symptoms.   There were a few missed questions on your memory screen today, primarily on delayed recall of memory.  I do think it would be reasonable to meet with neurology to determine if other testing needed or possible neuropsych testing to look into possible memory issues.  Let me know if you do not hear from their office in the next few weeks.  If any new symptoms please let me know, as well as if there are any questions.  Take care!

## 2024-01-11 NOTE — Progress Notes (Signed)
 Subjective:  Patient ID: Rodney JONETTA Enzo Mickey., male    DOB: 10-18-1950  Age: 73 y.o. MRN: [id number]  CC:  Chief Complaint  Patient presents with   Neck Pain    chiropractor recommended to be seen by PCP. Sx started the last few weeks. Having some swelling. Unknown if injury related    HPI Rodney Maler Enzo Mickey. presents for    Neck pain/swelling.  R side of neck noticed past few weeks - initially noticed by spouse. Sleeps on higher pillow, no known injury. Slight discomfort. No sore throat, some occasional night sweats - off and on for years - sporadic - few times per week. ? Food related. No unexplained weight loss.  No prior similar symptoms or swelling in neck.    Labs ordered in March were not drawn.  At the end of visit - 1 additional concern about memory.  Spouse had concern - forgets certain conversations they had, then he remembers lately. No difficulty with finances, bills, driving or other IADLS. Not forgetting people/faces.  Denies depression, anxiety.  He feels like he is doing ok. Requests memory testing today.       03/16/2023    2:09 PM 03/08/2022    9:52 AM 01/27/2022    9:27 AM 01/18/2021    8:40 AM 01/18/2021    8:26 AM  6CIT Screen  What Year? 0 points 0 points 0 points 0 points 0 points  What month? 0 points 0 points 0 points 0 points 0 points  What time? 0 points 0 points 0 points 0 points 0 points  Count back from 20 2 points 0 points 0 points 0 points 0 points  Months in reverse 0 points 0 points 0 points 0 points 0 points  Repeat phrase 4 points 0 points 0 points 2 points 4 points  Total Score 6 points 0 points 0 points 2 points 4 points      01/11/2024   10:30 AM  Montreal Cognitive Assessment   Visuospatial/ Executive (0/5) 4  Naming (0/3) 3  Attention: Read list of digits (0/2) 2  Attention: Read list of letters (0/1) 1  Attention: Serial 7 subtraction starting at 100 (0/3) 3  Language: Repeat phrase (0/2) 1  Language : Fluency (0/1) 0   Abstraction (0/2) 2  Delayed Recall (0/5) 0  Orientation (0/6) 6  Total 22  Adjusted Score (based on education) 22  He missed 1 point on visuospatial/executive, missed 2 points on language, and missed 5 points on delayed recall.   History Patient Active Problem List   Diagnosis Date Noted   Osteoarthritis of left glenohumeral joint 10/03/2019   OA (osteoarthritis) of hip 05/29/2019   Other specified arthritis, left hip 05/29/2019   Pain of left hip joint 02/22/2019   Need for influenza vaccination 03/28/2017   Injury of toe on right foot 03/28/2017   Subungual hematoma of great toe of right foot 03/28/2017   Insomnia 11/02/2016   Nocturia 11/03/2015   Hematuria 04/26/2014   Past Medical History:  Diagnosis Date   Allergy    Arthritis    Past Surgical History:  Procedure Laterality Date   INSERTION OF MESH Bilateral 02/21/2023   Procedure: INSERTION OF MESH;  Surgeon: Polly Cordella LABOR, MD;  Location[facility] OR;  Service: General;  Laterality: Bilateral;   TOTAL HIP ARTHROPLASTY Left 05/29/2019   Procedure: TOTAL HIP ARTHROPLASTY ANTERIOR APPROACH SAME DAY DISCHARGE;  Surgeon: Melodi Lerner, MD;  Location: [facility] ORS;  Service: Orthopedics;  Laterality: Left;    TOTAL SHOULDER ARTHROPLASTY Left 02/13/2020   Procedure: TOTAL SHOULDER ARTHROPLASTY;  Surgeon: Melita Drivers, MD;  Location: [facility] OR[facility];  Service: Orthopedics;  Laterality: Left;    No Known Allergies Prior to Admission medications   Medication Sig Start Date End Date Taking? Authorizing Provider  Ascorbic Acid (VITAMIN C) 1000 MG tablet Take 1,000 mg by mouth daily.   Yes [provider]  atorvastatin  (LIPITOR) 10 MG tablet Take 1 tablet (10 mg total) by mouth daily. 08/30/23  Yes Levora Reyes SAUNDERS, MD  Benfotiamine 150 MG CAPS Take 150 mg by mouth daily.   Yes [provider]  Cholecalciferol (VITAMIN D-3) 125 MCG (5000 UT) TABS Take 5,000 Units by mouth daily.   Yes [provider]   fluticasone  (FLONASE ) 50 MCG/ACT nasal spray Place 2 sprays into both nostrils daily. Patient taking differently: Place 2 sprays into both nostrils daily as needed for allergies. 04/05/21 01/11/24 Yes Joesph Shaver Scales, PA-C  GLUCOSAMINE PO Take 1,500 mg by mouth daily.    Yes [provider]  ipratropium (ATROVENT ) 0.06 % nasal spray Place 1-2 sprays into both nostrils 4 (four) times daily. As needed for nasal congestion 09/07/23  Yes Levora Reyes SAUNDERS, MD  Magnesium 400 MG TABS Take 400 mg by mouth 2 (two) times daily.   Yes [provider]  Melatonin 5 MG TABS Take 5-10 mg by mouth at bedtime.   Yes [provider]  Misc Natural Products (PROSTATE SUPPORT PO) Take 3 tablets by mouth daily.   Yes [provider]  OVER THE COUNTER MEDICATION Take 280 mg by mouth daily. Horsetail   Yes [provider]  SAW PALMETTO, SERENOA REPENS, PO Take 800 mg by mouth daily.    Yes [provider]  traZODone  (DESYREL ) 100 MG tablet TAKE 1/2 TO 1 TABLET BY MOUTH AT BEDTIME AS NEEDED FOR SLEEP 07/07/23  Yes Levora Reyes SAUNDERS, MD  TURMERIC PO Take 1,200 mg by mouth daily.    Yes [provider]  amoxicillin -clavulanate (AUGMENTIN ) 875-125 MG tablet Take 1 tablet by mouth 2 (two) times daily. Patient not taking: Reported on 01/11/2024 11/27/23   Levora Reyes SAUNDERS, MD  oxyCODONE  (ROXICODONE ) 5 MG immediate release tablet Take 1 tablet (5 mg total) by mouth every 8 (eight) hours as needed. Patient not taking: Reported on 01/11/2024 02/21/23 02/21/24  Polly Cordella LABOR, MD   Social History   Socioeconomic History   Marital status: Married    Spouse name: Not on file   Number of children: Not on file   Years of education: Not on file   Highest education level: Not on file  Occupational History   Not on file  Tobacco Use   Smoking status: Never   Smokeless tobacco: Never  Vaping Use   Vaping status: Never Used  Substance and Sexual Activity    Alcohol use: No   Drug use: No   Sexual activity: Yes    Birth control/protection: None    Comment: SEX PARTNERS IN THE LAST 12 MONTHS 1 AND CURRENT BIRTH CONTROL - NOT NEEDED  Other Topics Concern   Not on file  Social History Narrative   EXERCISE WALKING AND WEIGHT TRAINING 5-6 DAYS/WEEK FOR 30 MINUTES   His wife died 03-11-2017   Social Drivers of Health   Financial Resource Strain: Low Risk  (03/16/2023)   Overall Financial Resource Strain (CARDIA)    Difficulty of Paying Living Expenses: Not hard at all  Food Insecurity: No Food Insecurity (03/16/2023)   Hunger Vital Sign    Worried About Running Out of Food in the Last Year: Never true    Ran Out of Food in the Last Year: Never true  Transportation Needs: No Transportation Needs (03/16/2023)   PRAPARE - Administrator, Civil Service (Medical): No    Lack of Transportation (Non-Medical): No  Physical Activity: Sufficiently Active (03/16/2023)   Exercise Vital Sign    Days of Exercise per Week: 4 days    Minutes of Exercise per Session: 40 min  Stress: No Stress Concern Present (03/16/2023)   [employer] of Occupational Health - Occupational Stress Questionnaire    Feeling of Stress : Not at all  Social Connections: Moderately Integrated (03/16/2023)   Social Connection and Isolation Panel    Frequency of Communication with Friends and Family: More than three times a week    Frequency of Social Gatherings with Friends and Family: Once a week    Attends Religious Services: More than 4 times per year    Active Member of [employer] or Organizations: No    Attends Banker Meetings: Never    Marital Status: Married  Catering manager Violence: Not At Risk (03/16/2023)   Humiliation, Afraid, Rape, and Kick questionnaire    Fear of Current or Ex-Partner: No    Emotionally Abused: No    Physically Abused: No    Sexually Abused: No    Review of Systems Per HPI  Objective:   Vitals:   01/11/24  0853  BP: 138/80  Pulse: 82  Resp: 20  Temp: 98.2 F (36.8 C)  TempSrc: Temporal  SpO2: 97%  Weight: 163 lb 6.4 oz (74.1 kg)  Height: 5' 5.5 (1.664 m)     Physical Exam Vitals reviewed.  Constitutional:      Appearance: He is well-developed.  HENT:     Head: Normocephalic and atraumatic.  Neck:     Vascular: No carotid bruit or JVD.     Comments: Slight prominence of right lateral neck at area of SCM, nontender, no pain with resisted lateral flexion or rotation of the neck.  No appreciable mass or lymphadenopathy in area.  Clearing secretions normally without stridor, trachea midline. Cardiovascular:     Rate and Rhythm: Normal rate and regular rhythm.     Heart sounds: Normal heart sounds. No murmur heard. Pulmonary:     Effort: Pulmonary effort is normal.     Breath sounds: Normal breath sounds. No rales.  Musculoskeletal:     Right lower leg: No edema.     Left lower leg: No edema.  Skin:    General: Skin is warm and dry.  Neurological:     Mental Status: He is alert and oriented to person, place, and time.  Psychiatric:        Mood and Affect: Mood normal.     Assessment & Plan:  Rebel Willcutt. is a 73 y.o. male . Neck swelling - Plan: US  Soft Tissue Head/Neck (NON-THYROID ), CBC, Sedimentation rate  Night sweat - Plan: CBC, TSH, Sedimentation rate  Hyperlipidemia, unspecified hyperlipidemia type - Plan: Comprehensive metabolic panel, Lipid panel  Memory difficulty - Plan: Ambulatory referral to Neurology Area of concern appears to be at the [facility] muscle but no pain with motion, resisted testing.  I do not appreciate any lymphadenopathy, but will check ultrasound, blood work as above given intermittent night sweats.  Change in position and pillow, range of motion  for now with RTC precautions.  Labs ordered in March to be drawn today.  Memory difficulty Delayed recall appears to be main area of difficulty but did have some deficiency on language testing,  visuospatial executive.  Potentially may need neuropsych testing but will refer to neurology initially.   No orders of the defined types were placed in this encounter.  Patient Instructions  Thanks for coming in today.  Based on the area of your symptoms, I think you could have some irritation or possible spasm of the sternocleidomastoid muscle or SCM muscle.  I do not feel any swollen lymph nodes or masses, but I will order an ultrasound as well as some blood work with the history of occasional night sweats.  We have also checked the labs that were ordered in March.  Try some gentle range of motion throughout the day, change in pillow position may also be helpful and follow-up in the next few weeks if you do not notice an improvement in that area.  Sooner if any new or worsening symptoms.  Take care!      Signed,   Reyes Pines, MD [facility], [facility] 01/11/24 10:03 AM

## 2024-01-11 NOTE — Addendum Note (Signed)
 Addended by: Caroly Purewal R on: 01/11/2024 10:10 AM   Modules accepted: Orders

## 2024-01-12 ENCOUNTER — Ambulatory Visit
Admission: RE | Admit: 2024-01-12 | Discharge: 2024-01-12 | Disposition: A | Discharge status: Home / Self Care | Source: Ambulatory Visit | Attending: Family Medicine | Admitting: Family Medicine

## 2024-01-12 DIAGNOSIS — R221 Localized swelling, mass and lump, neck: Secondary | ICD-10-CM

## 2024-01-12 LAB — LIPID PANEL
Cholesterol: 188 mg/dL (ref 0–200)
HDL: 51.5 mg/dL (ref >39.00)
LDL Cholesterol: 113 mg/dL — ABNORMAL HIGH (ref 0–99)
NonHDL: 136.05
Total CHOL/HDL Ratio: 4
Triglycerides: 117 mg/dL (ref 0.0–149.0)
VLDL: 23.4 mg/dL (ref 0.0–40.0)

## 2024-01-12 LAB — COMPREHENSIVE METABOLIC PANEL WITH GFR
ALT: 26 U/L (ref 0–53)
AST: 26 U/L (ref 0–37)
Albumin: 4.7 g/dL (ref 3.5–5.2)
Alkaline Phosphatase: 69 U/L (ref 39–117)
BUN: 34 mg/dL — ABNORMAL HIGH (ref 6–23)
CO2: 25 meq/L (ref 19–32)
Calcium: 9.7 mg/dL (ref 8.4–10.5)
Chloride: 102 meq/L (ref 96–112)
Creatinine, Ser: 1.22 mg/dL (ref 0.40–1.50)
GFR: 59.23 mL/min — ABNORMAL LOW (ref >60.00)
Glucose, Bld: 82 mg/dL (ref 70–99)
Potassium: 4.6 meq/L (ref 3.5–5.1)
Sodium: 140 meq/L (ref 135–145)
Total Bilirubin: 1.7 mg/dL — ABNORMAL HIGH (ref 0.2–1.2)
Total Protein: 7.3 g/dL (ref 6.0–8.3)

## 2024-01-14 ENCOUNTER — Ambulatory Visit: Payer: Self-pay | Admitting: Family Medicine

## 2024-01-16 ENCOUNTER — Encounter: Payer: Self-pay | Admitting: Physician Assistant

## 2024-01-22 NOTE — Progress Notes (Signed)
 Pt has been notified.

## 2024-03-18 ENCOUNTER — Ambulatory Visit (INDEPENDENT_AMBULATORY_CARE_PROVIDER_SITE_OTHER): Admitting: Family Medicine

## 2024-03-18 ENCOUNTER — Telehealth: Payer: Self-pay

## 2024-03-18 ENCOUNTER — Encounter: Payer: Self-pay | Admitting: Family Medicine

## 2024-03-18 VITALS — BP 118/56 | HR 60 | Temp 98.2°F | Resp 18 | Ht 65.5 in | Wt 162.4 lb

## 2024-03-18 DIAGNOSIS — J309 Allergic rhinitis, unspecified: Secondary | ICD-10-CM

## 2024-03-18 DIAGNOSIS — E785 Hyperlipidemia, unspecified: Secondary | ICD-10-CM

## 2024-03-18 DIAGNOSIS — Z125 Encounter for screening for malignant neoplasm of prostate: Secondary | ICD-10-CM

## 2024-03-18 DIAGNOSIS — G4700 Insomnia, unspecified: Secondary | ICD-10-CM | POA: Diagnosis not present

## 2024-03-18 DIAGNOSIS — Z0000 Encounter for general adult medical examination without abnormal findings: Secondary | ICD-10-CM

## 2024-03-18 LAB — PSA: PSA: 3.61 ng/mL (ref 0.10–4.00)

## 2024-03-18 NOTE — Patient Instructions (Addendum)
 Thank you for coming in today. No change in medications at this time. If there are any concerns on your bloodwork, I will let you know.  I did check the prostate test today.  I will keep an eye out on the results from your neurologist visit.  Please let me know if there are questions from today's visit and take care!  Preventive Care 6 Years and Older, Male Preventive care refers to lifestyle choices and visits with your health care provider that can promote health and wellness. Preventive care visits are also called wellness exams. What can I expect for my preventive care visit? Counseling During your preventive care visit, your health care provider may ask about your: Medical history, including: Past medical problems. Family medical history. History of falls. Current health, including: Emotional well-being. Home life and relationship well-being. Sexual activity. Memory and ability to understand (cognition). Lifestyle, including: Alcohol, nicotine or tobacco, and drug use. Access to firearms. Diet, exercise, and sleep habits. Work and work Astronomer. Sunscreen use. Safety issues such as seatbelt and bike helmet use. Physical exam Your health care provider will check your: Height and weight. These may be used to calculate your BMI (body mass index). BMI is a measurement that tells if you are at a healthy weight. Waist circumference. This measures the distance around your waistline. This measurement also tells if you are at a healthy weight and may help predict your risk of certain diseases, such as type 2 diabetes and high blood pressure. Heart rate and blood pressure. Body temperature. Skin for abnormal spots. What immunizations do I need?  Vaccines are usually given at various ages, according to a schedule. Your health care provider will recommend vaccines for you based on your age, medical history, and lifestyle or other factors, such as travel or where you work. What tests do  I need? Screening Your health care provider may recommend screening tests for certain conditions. This may include: Lipid and cholesterol levels. Diabetes screening. This is done by checking your blood sugar (glucose) after you have not eaten for a while (fasting). Hepatitis C test. Hepatitis B test. HIV (human immunodeficiency virus) test. STI (sexually transmitted infection) testing, if you are at risk. Lung cancer screening. Colorectal cancer screening. Prostate cancer screening. Abdominal aortic aneurysm (AAA) screening. You may need this if you are a current or former smoker. Talk with your health care provider about your test results, treatment options, and if necessary, the need for more tests. Follow these instructions at home: Eating and drinking  Eat a diet that includes fresh fruits and vegetables, whole grains, lean protein, and low-fat dairy products. Limit your intake of foods with high amounts of sugar, saturated fats, and salt. Take vitamin and mineral supplements as recommended by your health care provider. Do not drink alcohol if your health care provider tells you not to drink. If you drink alcohol: Limit how much you have to 0-2 drinks a day. Know how much alcohol is in your drink. In the [place] one drink equals one 12 oz bottle of beer (355 mL), one 5 oz glass of wine (148 mL), or one 1 oz glass of hard liquor (44 mL). Lifestyle Brush your teeth every morning and night with fluoride  toothpaste. Floss one time each day. Exercise for at least 30 minutes 5 or more days each week. Do not use any products that contain nicotine or tobacco. These products include cigarettes, chewing tobacco, and vaping devices, such as e-cigarettes. If you need help quitting, ask  your health care provider. Do not use drugs. If you are sexually active, practice safe sex. Use a condom or other form of protection to prevent STIs. Take aspirin  only as told by your health care provider. Make  sure that you understand how much to take and what form to take. Work with your health care provider to find out whether it is safe and beneficial for you to take aspirin  daily. Ask your health care provider if you need to take a cholesterol-lowering medicine (statin). Find healthy ways to manage stress, such as: Meditation, yoga, or listening to music. Journaling. Talking to a trusted person. Spending time with friends and family. Safety Always wear your seat belt while driving or riding in a vehicle. Do not drive: If you have been drinking alcohol. Do not ride with someone who has been drinking. When you are tired or distracted. While texting. If you have been using any mind-altering substances or drugs. Wear a helmet and other protective equipment during sports activities. If you have firearms in your house, make sure you follow all gun safety procedures. Minimize exposure to UV radiation to reduce your risk of skin cancer. What's next? Visit your health care provider once a year for an annual wellness visit. Ask your health care provider how often you should have your eyes and teeth checked. Stay up to date on all vaccines. This information is not intended to replace advice given to you by your health care provider. Make sure you discuss any questions you have with your health care provider. Document Revised: 11/18/2020 Document Reviewed: 11/18/2020 Elsevier Patient Education  2024 [employer].

## 2024-03-18 NOTE — Progress Notes (Signed)
 Subjective:  Patient ID: Rodney JONETTA Enzo Mickey., male    DOB: 06-Oct-1950  Age: 73 y.o. MRN: [id number]  CC:  Chief Complaint  Patient presents with   Annual Exam    No questions or concerns.     HPI Rodney Muegge Enzo Mickey. presents for Annual Exam, doing well, no acute health concerns.   PCP, me General Surgery, Dr. Polly with prior bilateral inguinal hernia repair in 2024. Dermatology, Dr. Livingston, previously Dr. Shona Beers, Dr. Melita for shoulder Optometry, Dr. Garnette Pinal Podiatry, Dr. Alwin Urology Dr. Virgilio BPH, history of elevated PSA that improved on recheck and plan for PSA monitoring through age 61. Gastroenterology, Dr. Kristie, colonoscopy 2022, 7-year repeat  Hyperlipidemia: Lipitor 10 mg daily without any myalgias or side effects.  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  Continued monitoring planned.  Recent labs noted from August.  Improving LDL at that time.  Lab Results  Component Value Date   CHOL 188 01/11/2024   HDL 51.50 01/11/2024   LDLCALC 113 (H) 01/11/2024   TRIG 117.0 01/11/2024   CHOLHDL 4 01/11/2024   Lab Results  Component Value Date   ALT 26 01/11/2024   AST 26 01/11/2024   ALKPHOS 69 01/11/2024   BILITOT 1.7 (H) 01/11/2024   Insomnia Trazodone  100 mg nightly.  Option of lower dosing discussed previously. Still taking full pill - he may try 1/2 pill to see if effective.   Flonase  as needed intermittently for allergic rhinitis, effective.     03/18/2024    9:41 AM 08/30/2023    9:26 AM 03/16/2023    2:08 PM 03/01/2023    9:27 AM 01/11/2023    2:11 PM  Depression screen PHQ 2/9  Decreased Interest 0 0 0 1 0  Down, Depressed, Hopeless 0 0 0 0 0  PHQ - 2 Score 0 0 0 1 0  Altered sleeping 0 1 2 0 1  Tired, decreased energy 0 0 0 0 0  Change in appetite 0 0 0 0 0  Feeling bad or failure about yourself   0 0 0 0 0  Trouble concentrating 0 0 0 0 0  Moving slowly or fidgety/restless 0 0 0 0 0  Suicidal thoughts 0 0 0 0 0  PHQ-9 Score 0 1 2 1 1   Difficult doing work/chores Not difficult at all  Not difficult at all Not difficult at all     Health Maintenance  Topic Date Due   Influenza Vaccine  01/05/2024   COVID-19 Vaccine (5 - 2025-26 season) 02/05/2024   Medicare Annual Wellness (AWV)  03/15/2024   Colonoscopy  06/03/2031   DTaP/Tdap/Td (3 - Td or Tdap) 08/29/2033   Pneumococcal Vaccine: 50+ Years  Completed   Hepatitis C Screening  Completed   Zoster Vaccines- Shingrix   Completed   Meningococcal B Vaccine  Aged Out  Colonoscopy, plan for repeat in 2029 as above. Prostate: As above, has seen urology previously, elevated PSA that improved.  Monitoring through age 27 recommended.  The natural history of prostate cancer and ongoing controversy regarding screening and potential treatment outcomes of prostate cancer has been discussed with the patient. The meaning of a false positive PSA and a false negative PSA has been discussed. He indicates understanding of the limitations of this screening test and wishes to proceed with screening PSA testing.no recent urology visit.  Lab  Results  Component Value Date   PSA1 2.7 01/18/2019   PSA1 2.6 12/05/2017   PSA 2.81 08/18/2022   PSA 8.38 Repeated and verified X2. (H) 01/18/2021   PSA 1.60 11/03/2015    Immunization History  Administered Date(s) Administered   Fluad Quad(high Dose 65+) 01/19/2019   INFLUENZA, HIGH DOSE SEASONAL PF 02/27/2018, 01/29/2023   Influenza,inj,Quad PF,6+ Mos 03/28/2017   Influenza,inj,quad, With Preservative 03/07/2019   Influenza-Unspecified 02/27/2018   PFIZER(Purple Top)SARS-COV-2 Vaccination 07/15/2019, 08/09/2019, 03/27/2020, 01/29/2023   Pneumococcal Conjugate-13 03/10/2017   Pneumococcal Polysaccharide-23 11/11/2019   Tdap 03/19/2013, 08/30/2023   Zoster Recombinant(Shingrix ) 01/19/2019, 04/08/2019  RSV  option discussed at pharmacy. Flu vaccine, COVID booster both at pharmacy few weeks.   No results found. Yearly follow-up with optometry.  Dental: every 6 months.   Alcohol:none  Tobacco: none  Exercise: 6 days per week, on exercise bike.  Wt Readings from Last 3 Encounters:  03/18/24 162 lb 6.4 oz (73.7 kg)  01/11/24 163 lb 6.4 oz (74.1 kg)  11/27/23 165 lb 6.4 oz (75 kg)   Will be seeing neuro in few weeks to check memory - forgetful at times. Small amt. Referred last visit in August.  Wearing hearing aids - working ok.     01/11/2024   10:30 AM  Montreal Cognitive Assessment   Visuospatial/ Executive (0/5) 4  Naming (0/3) 3  Attention: Read list of digits (0/2) 2  Attention: Read list of letters (0/1) 1  Attention: Serial 7 subtraction starting at 100 (0/3) 3  Language: Repeat phrase (0/2) 1  Language : Fluency (0/1) 0  Abstraction (0/2) 2  Delayed Recall (0/5) 0  Orientation (0/6) 6  Total 22  Adjusted Score (based on education) 22       History Patient Active Problem List   Diagnosis Date Noted   Osteoarthritis of left glenohumeral joint 10/03/2019   OA (osteoarthritis) of hip 05/29/2019   Other specified arthritis, left hip 05/29/2019   Pain of left hip joint 02/22/2019   Need for influenza vaccination 03/28/2017   Injury of toe on right foot 03/28/2017   Subungual hematoma of great toe of right foot 03/28/2017   Insomnia 11/02/2016   Nocturia 11/03/2015   Hematuria 04/26/2014   Past Medical History:  Diagnosis Date   Allergy    Arthritis    Past Surgical History:  Procedure Laterality Date   INSERTION OF MESH Bilateral 02/21/2023   Procedure: INSERTION OF MESH;  Surgeon: Polly Cordella LABOR, MD;  Location: [facility] OR;  Service: General;  Laterality: Bilateral;   TOTAL HIP ARTHROPLASTY Left 05/29/2019   Procedure: TOTAL HIP ARTHROPLASTY ANTERIOR APPROACH SAME DAY DISCHARGE;  Surgeon: Melodi Lerner, MD;  Location: [facility] ORS;  Service: Orthopedics;   Laterality: Left;    TOTAL SHOULDER ARTHROPLASTY Left 02/13/2020   Procedure: TOTAL SHOULDER ARTHROPLASTY;  Surgeon: Melita Drivers, MD;  Location: [facility] ORS;  Service: Orthopedics;  Laterality: Left;    No Known Allergies Prior to Admission medications   Medication Sig Start Date End Date Taking? Authorizing Provider  Ascorbic Acid (VITAMIN C) 1000 MG tablet Take 1,000 mg by mouth daily.   Yes [provider]  atorvastatin  (LIPITOR) 10 MG tablet Take 1 tablet (10 mg total) by mouth daily. 08/30/23  Yes Levora Reyes SAUNDERS, MD  Benfotiamine 150 MG CAPS Take 150 mg by mouth daily.   Yes [provider]  Cholecalciferol (VITAMIN D-3) 125 MCG (5000 UT) TABS Take 5,000 Units by mouth daily.   Yes  [provider]  fluticasone  (FLONASE ) 50 MCG/ACT nasal spray Place 2 sprays into both nostrils daily. Patient taking differently: Place 2 sprays into both nostrils daily as needed for allergies. 04/05/21 03/18/24 Yes Joesph Shaver Scales, PA-C  GLUCOSAMINE PO Take 1,500 mg by mouth daily.    Yes [provider]  ipratropium (ATROVENT ) 0.06 % nasal spray Place 1-2 sprays into both nostrils 4 (four) times daily. As needed for nasal congestion 09/07/23  Yes Levora Reyes SAUNDERS, MD  Magnesium 400 MG TABS Take 400 mg by mouth 2 (two) times daily.   Yes [provider]  Melatonin 5 MG TABS Take 5-10 mg by mouth at bedtime.   Yes [provider]  Misc Natural Products (PROSTATE SUPPORT PO) Take 3 tablets by mouth daily.   Yes [provider]  SAW PALMETTO, SERENOA REPENS, PO Take 800 mg by mouth daily.    Yes [provider]  traZODone  (DESYREL ) 100 MG tablet TAKE 1/2 TO 1 TABLET BY MOUTH AT BEDTIME AS NEEDED FOR SLEEP 07/07/23  Yes Levora Reyes SAUNDERS, MD  TURMERIC PO Take 1,200 mg by mouth daily.    Yes [provider]  amoxicillin -clavulanate (AUGMENTIN ) 875-125 MG tablet Take 1 tablet by mouth 2 (two) times daily. Patient not  taking: Reported on 03/18/2024 11/27/23   Levora Reyes SAUNDERS, MD  OVER THE COUNTER MEDICATION Take 280 mg by mouth daily. Horsetail Patient not taking: Reported on 03/18/2024    [provider]   Social History   Socioeconomic History   Marital status: Married    Spouse name: Not on file   Number of children: Not on file   Years of education: Not on file   Highest education level: Not on file  Occupational History   Not on file  Tobacco Use   Smoking status: Never   Smokeless tobacco: Never  Vaping Use   Vaping status: Never Used  Substance and Sexual Activity   Alcohol use: No   Drug use: No   Sexual activity: Yes    Birth control/protection: None    Comment: SEX PARTNERS IN THE LAST 12 MONTHS 1 AND CURRENT BIRTH CONTROL - NOT NEEDED  Other Topics Concern   Not on file  Social History Narrative   EXERCISE WALKING AND WEIGHT TRAINING 5-6 DAYS/WEEK FOR 30 MINUTES   His wife died March 24, 2017   Social Drivers of Health   Financial Resource Strain: Low Risk  (03/16/2023)   Overall Financial Resource Strain (CARDIA)    Difficulty of Paying Living Expenses: Not hard at all  Food Insecurity: No Food Insecurity (03/16/2023)   Hunger Vital Sign    Worried About Running Out of Food in the Last Year: Never true    Ran Out of Food in the Last Year: Never true  Transportation Needs: No Transportation Needs (03/16/2023)   PRAPARE - Administrator, Civil Service (Medical): No    Lack of Transportation (Non-Medical): No  Physical Activity: Sufficiently Active (03/16/2023)   Exercise Vital Sign    Days of Exercise per Week: 4 days    Minutes of Exercise per Session: 40 min  Stress: No Stress Concern Present (03/16/2023)   [employer] of Occupational Health - Occupational Stress Questionnaire    Feeling of Stress : Not at all  Social Connections: Moderately Integrated (03/16/2023)   Social Connection and Isolation Panel    Frequency of Communication with  Friends and Family: More than three times a week    Frequency  of Social Gatherings with Friends and Family: Once a week    Attends Religious Services: More than 4 times per year    Active Member of [employer] or Organizations: No    Attends Banker Meetings: Never    Marital Status: Married  Catering manager Violence: Not At Risk (03/16/2023)   Humiliation, Afraid, Rape, and Kick questionnaire    Fear of Current or Ex-Partner: No    Emotionally Abused: No    Physically Abused: No    Sexually Abused: No    Review of Systems 13 point review of systems per patient health survey noted.  Negative other than as indicated above or in HPI.    Objective:   Vitals:   03/18/24 0938  BP: (!) 118/56  Pulse: 60  Resp: 18  Temp: 98.2 F (36.8 C)  TempSrc: Temporal  SpO2: 96%  Weight: 162 lb 6.4 oz (73.7 kg)  Height: 5' 5.5 (1.664 m)     Physical Exam Vitals reviewed.  Constitutional:      Appearance: He is well-developed.  HENT:     Head: Normocephalic and atraumatic.     Right Ear: External ear normal.     Left Ear: External ear normal.  Eyes:     Conjunctiva/sclera: Conjunctivae normal.     Pupils: Pupils are equal, round, and reactive to light.  Neck:     Thyroid : No thyromegaly.  Cardiovascular:     Rate and Rhythm: Normal rate and regular rhythm.     Heart sounds: Normal heart sounds.  Pulmonary:     Effort: Pulmonary effort is normal. No respiratory distress.     Breath sounds: Normal breath sounds. No wheezing.  Abdominal:     General: There is no distension.     Palpations: Abdomen is soft.     Tenderness: There is no abdominal tenderness.  Musculoskeletal:        General: No tenderness. Normal range of motion.     Cervical back: Normal range of motion and neck supple.  Lymphadenopathy:     Cervical: No cervical adenopathy.  Skin:    General: Skin is warm and dry.  Neurological:     Mental Status: He is alert and oriented to person, place, and time.      Deep Tendon Reflexes: Reflexes are normal and symmetric.  Psychiatric:        Behavior: Behavior normal.        Assessment & Plan:  Rodney Heyer. is a 73 y.o. male . Annual physical exam  - -anticipatory guidance as below in AVS, screening labs above. Health maintenance items as above in HPI discussed/recommended as applicable.   Allergic rhinitis, unspecified seasonality, unspecified trigger  - Stable with infrequent need for Flonase .  Continue same  Insomnia, unspecified type  - Stable trazodone , option of lower dosing discussed.  Hyperlipidemia, unspecified hyperlipidemia type - Plan: CANCELED: Comprehensive metabolic panel with GFR, CANCELED: Lipid panel  - Recent labs as above with improving LDL, continue same dose atorvastatin .  19-month follow-up  Screening for prostate cancer - Plan: PSA  - As above, plan for PSA through age 30, prior elevation but stable last year.  Plan for neurology follow-up soon for memory concerns.  Continue exercise.  MoCA testing in August as above.  No new meds for now.  No orders of the defined types were placed in this encounter.  Patient Instructions  Thank you for coming in today. No change in medications at this time. If there  are any concerns on your bloodwork, I will let you know.  I did check the prostate test today.  I will keep an eye out on the results from your neurologist visit.  Please let me know if there are questions from today's visit and take care!  Preventive Care 52 Years and Older, Male Preventive care refers to lifestyle choices and visits with your health care provider that can promote health and wellness. Preventive care visits are also called wellness exams. What can I expect for my preventive care visit? Counseling During your preventive care visit, your health care provider may ask about your: Medical history, including: Past medical problems. Family medical history. History of falls. Current health,  including: Emotional well-being. Home life and relationship well-being. Sexual activity. Memory and ability to understand (cognition). Lifestyle, including: Alcohol, nicotine or tobacco, and drug use. Access to firearms. Diet, exercise, and sleep habits. Work and work Astronomer. Sunscreen use. Safety issues such as seatbelt and bike helmet use. Physical exam Your health care provider will check your: Height and weight. These may be used to calculate your BMI (body mass index). BMI is a measurement that tells if you are at a healthy weight. Waist circumference. This measures the distance around your waistline. This measurement also tells if you are at a healthy weight and may help predict your risk of certain diseases, such as type 2 diabetes and high blood pressure. Heart rate and blood pressure. Body temperature. Skin for abnormal spots. What immunizations do I need?  Vaccines are usually given at various ages, according to a schedule. Your health care provider will recommend vaccines for you based on your age, medical history, and lifestyle or other factors, such as travel or where you work. What tests do I need? Screening Your health care provider may recommend screening tests for certain conditions. This may include: Lipid and cholesterol levels. Diabetes screening. This is done by checking your blood sugar (glucose) after you have not eaten for a while (fasting). Hepatitis C test. Hepatitis B test. HIV (human immunodeficiency virus) test. STI (sexually transmitted infection) testing, if you are at risk. Lung cancer screening. Colorectal cancer screening. Prostate cancer screening. Abdominal aortic aneurysm (AAA) screening. You may need this if you are a current or former smoker. Talk with your health care provider about your test results, treatment options, and if necessary, the need for more tests. Follow these instructions at home: Eating and drinking  Eat a diet that  includes fresh fruits and vegetables, whole grains, lean protein, and low-fat dairy products. Limit your intake of foods with high amounts of sugar, saturated fats, and salt. Take vitamin and mineral supplements as recommended by your health care provider. Do not drink alcohol if your health care provider tells you not to drink. If you drink alcohol: Limit how much you have to 0-2 drinks a day. Know how much alcohol is in your drink. In the [place] one drink equals one 12 oz bottle of beer (355 mL), one 5 oz glass of wine (148 mL), or one 1 oz glass of hard liquor (44 mL). Lifestyle Brush your teeth every morning and night with fluoride  toothpaste. Floss one time each day. Exercise for at least 30 minutes 5 or more days each week. Do not use any products that contain nicotine or tobacco. These products include cigarettes, chewing tobacco, and vaping devices, such as e-cigarettes. If you need help quitting, ask your health care provider. Do not use drugs. If you are sexually active, practice  safe sex. Use a condom or other form of protection to prevent STIs. Take aspirin  only as told by your health care provider. Make sure that you understand how much to take and what form to take. Work with your health care provider to find out whether it is safe and beneficial for you to take aspirin  daily. Ask your health care provider if you need to take a cholesterol-lowering medicine (statin). Find healthy ways to manage stress, such as: Meditation, yoga, or listening to music. Journaling. Talking to a trusted person. Spending time with friends and family. Safety Always wear your seat belt while driving or riding in a vehicle. Do not drive: If you have been drinking alcohol. Do not ride with someone who has been drinking. When you are tired or distracted. While texting. If you have been using any mind-altering substances or drugs. Wear a helmet and other protective equipment during sports  activities. If you have firearms in your house, make sure you follow all gun safety procedures. Minimize exposure to UV radiation to reduce your risk of skin cancer. What's next? Visit your health care provider once a year for an annual wellness visit. Ask your health care provider how often you should have your eyes and teeth checked. Stay up to date on all vaccines. This information is not intended to replace advice given to you by your health care provider. Make sure you discuss any questions you have with your health care provider. Document Revised: 11/18/2020 Document Reviewed: 11/18/2020 Elsevier Patient Education  2024 Elsevier Inc.     Signed,   Reyes Pines, MD [facility], [facility] 03/18/24 10:15 AM

## 2024-03-18 NOTE — Telephone Encounter (Signed)
 Noted thanks

## 2024-03-18 NOTE — Telephone Encounter (Signed)
 FYI   Copied from CRM [id number]. Topic: General - Other >> Mar 18, 2024 10:50 AM Robinson H wrote: Reason for CRM: Patient wife states patient told Dr. Levora he's going to [facility]rology but he has appointment with [facility] on 10/23 Camie Sevin, wanted to clarify that     Raife [phone] [facility] [phone]

## 2024-03-20 ENCOUNTER — Ambulatory Visit: Payer: Self-pay | Admitting: Family Medicine

## 2024-03-20 DIAGNOSIS — Z125 Encounter for screening for malignant neoplasm of prostate: Secondary | ICD-10-CM

## 2024-03-21 ENCOUNTER — Telehealth: Payer: Self-pay

## 2024-03-21 NOTE — Telephone Encounter (Signed)
 I believe this was documented in the wrong chart

## 2024-03-21 NOTE — Telephone Encounter (Signed)
 Copied from CRM [id number]. Topic: Clinical - Lab/Test Results >> Mar 21, 2024  3:26 PM Franky GRADE wrote: Reason for CRM: Patient is calling regarding lab results, advised patient of the Mychart message sent by Dr.Greene. Patient understood and had no questions for Dr.Greene at the moment.

## 2024-03-23 NOTE — Progress Notes (Signed)
 Assessment/Plan:   Rodney JONETTA Enzo Mickey. is a very pleasant 73 y.o. year old RH male with a history of arthritis,  seen today for evaluation of memory loss. MoCA today is /30.***.  Patient is able to participate on ADLs***.  Patient continues to drive without significant difficulties.***    Memory Impairment of unclear etiology  MRI brain without contrast to assess for underlying structural abnormality and assess vascular load  Neurocognitive testing to further evaluate cognitive concerns and determine other underlying cause of memory changes, including potential contribution from sleep, anxiety, attention, or depression  Check B12, TSH Continue to control mood as per PCP Recommend good control of cardiovascular risk factors Folllow up in  months***  Subjective:   The patient is accompanied by ***  who supplement  the history.   How long did patient have memory difficulties? For the last ***.  Reports some difficulty remembering new information, conversations and names.  Long-term memory is good. repeats oneself?  Endorsed Disoriented when walking into a room?  Patient denies except occasionally not remembering what patient came to the room for ***  Leaving objects in unusual places? Denies.   Wandering behavior?  denies .  Any personality changes?  Denies.   Any history of depression?:  Denies   Hallucinations or paranoia?  Denies   Seizures?  Denies    Any sleep changes?   Sleeps well***does not sleep well***denies vivid dreams, REM behavior or sleepwalking   Sleep apnea?  Denies   Any hygiene concerns?  Denies   Independent of bathing and dressing?  Endorsed  Does the patient needs help with medications? Patient is in charge *** Who is in charge of the finances? Patient is in charge   *** Any changes in appetite?  Denies ***   Patient have trouble swallowing? Denies.   Does the patient cook? No ***  Any kitchen accidents such as leaving the stove on? Denies.   Any history  of headaches?   Denies.   Chronic pain ? Denies.   Ambulates with difficulty?  Denies. *** Recent falls or head injuries? Denies.   Vision changes? Denies.   Any stroke like symptoms? Denies.   Any tremors?   Denies.   Any anosmia?  Denies.   Any incontinence of urine? Nocturia   Any bowel dysfunction? Denies.      Patient lives with  *** History of heavy alcohol intake? Denies.   History of heavy tobacco use? Denies.   Family history of dementia? Denies.  Does patient drive? Yes ***  Pertinent available labs: ***  MRI brain, personally reviewed, remarkable for  ***  Past Medical History:  Diagnosis Date   Allergy    Arthritis      Past Surgical History:  Procedure Laterality Date   INSERTION OF MESH Bilateral 02/21/2023   Procedure: INSERTION OF MESH;  Surgeon: Polly Cordella LABOR, MD;  Location: [facility] OR;  Service: General;  Laterality: Bilateral;   TOTAL HIP ARTHROPLASTY Left 05/29/2019   Procedure: TOTAL HIP ARTHROPLASTY ANTERIOR APPROACH SAME DAY DISCHARGE;  Surgeon: Melodi Lerner, MD;  Location: [facility] ORS;  Service: Orthopedics;  Laterality: Left;    TOTAL SHOULDER ARTHROPLASTY Left 02/13/2020   Procedure: TOTAL SHOULDER ARTHROPLASTY;  Surgeon: Melita Drivers, MD;  Location: [facility] ORS;  Service: Orthopedics;  Laterality: Left;      No Known Allergies  Current Outpatient Medications  Medication Instructions   amoxicillin -clavulanate (AUGMENTIN ) 875-125 MG tablet 1 tablet, Oral, 2 times daily  atorvastatin  (LIPITOR) 10 mg, Oral, Daily   Benfotiamine 150 mg, Daily   fluticasone  (FLONASE ) 50 MCG/ACT nasal spray 2 sprays, Each Nare, Daily   GLUCOSAMINE PO 1,500 mg, Daily   ipratropium (ATROVENT ) 0.06 % nasal spray 1-2 sprays, Each Nare, 4 times daily, As needed for nasal congestion   Magnesium 400 mg, 2 times daily   melatonin 5-10 mg, Daily at bedtime   Misc Natural Products (PROSTATE SUPPORT PO) 3 tablets, Daily   OVER THE COUNTER MEDICATION 280 mg, Daily   SAW  PALMETTO, SERENOA REPENS, PO 800 mg, Daily   traZODone  (DESYREL ) 50-100 mg, Oral, At bedtime PRN, for sleep   TURMERIC PO 1,200 mg, Daily   vitamin C 1,000 mg, Daily   Vitamin D-3 5,000 Units, Daily     VITALS:  There were no vitals filed for this visit.       01/11/2024   10:30 AM  Montreal Cognitive Assessment   Visuospatial/ Executive (0/5) 4  Naming (0/3) 3  Attention: Read list of digits (0/2) 2  Attention: Read list of letters (0/1) 1  Attention: Serial 7 subtraction starting at 100 (0/3) 3  Language: Repeat phrase (0/2) 1  Language : Fluency (0/1) 0  Abstraction (0/2) 2  Delayed Recall (0/5) 0  Orientation (0/6) 6  Total 22  Adjusted Score (based on education) 22        No data to display           PHYSICAL EXAM   HEENT:  Normocephalic, atraumatic. The superficial temporal arteries are without ropiness or tenderness. Cardiovascular: Regular rate and rhythm. Lungs: Clear to auscultation bilaterally. Neck: There are no carotid bruits noted bilaterally.  Orientation:  Alert and oriented to person, place and time. No aphasia or dysarthria. Fund of knowledge is appropriate. Recent memory impaired and remote memory intact.  Attention and concentration are normal.  Able to name objects and repeat phrases. Delayed recall  /5 Cranial nerves: There is good facial symmetry. Extraocular muscles are intact and visual fields are full to confrontational testing. Speech is fluent and clear. No tongue deviation. Hearing is intact to conversational tone. Tone: Tone is good throughout. Abnormal movements: No tremors. No Asterixis. No Fasciculations Sensation: Sensation is intact to light touch. Vibration is intact at the bilateral big toe.  Coordination: The patient has no difficulty with RAM's or FNF bilaterally. Normal finger to nose  Motor: Strength is 5/5 in the bilateral upper and lower extremities. There is no pronator drift. There are no fasciculations noted. DTR's: Deep  tendon reflexes are 2/4 bilaterally. Gait and Station: The patient is able to ambulate without difficulty The patient is able to heel toe walk. Gait is cautious and narrow. The patient is able to ambulate in a tandem fashion.       Thank you for allowing us  the opportunity to participate in the care of this nice patient. Please do not hesitate to contact us  for any questions or concerns.   Total time spent on today's visit was *** minutes dedicated to this patient today, preparing to see patient, examining the patient, ordering tests and/or medications and counseling the patient, documenting clinical information in the EHR or other health record, independently interpreting results and communicating results to the patient/family, discussing treatment and goals, answering patient's questions and coordinating care.  Cc:  Levora Reyes SAUNDERS, MD  Camie Sevin 03/23/2024 6:19 PM

## 2024-03-25 NOTE — Telephone Encounter (Signed)
ERROR NOTED

## 2024-03-26 NOTE — Telephone Encounter (Signed)
 He has seen Dr. Watt previously.  Looks like the last visit was in 2022.  I will place a referral.

## 2024-03-27 ENCOUNTER — Ambulatory Visit

## 2024-03-27 VITALS — Ht 65.5 in | Wt 162.0 lb

## 2024-03-27 DIAGNOSIS — Z0000 Encounter for general adult medical examination without abnormal findings: Secondary | ICD-10-CM | POA: Diagnosis not present

## 2024-03-27 NOTE — Progress Notes (Signed)
 Subjective:   Rodney Bailey. is a 73 y.o. who presents for a Medicare Wellness preventive visit.  As a reminder, Annual Wellness Visits don't include a physical exam, and some assessments may be limited, especially if this visit is performed virtually. We may recommend an in-person follow-up visit with your provider if needed.  Visit Complete: Virtual I connected with  Rodney Bailey. on 03/27/24 by a video and audio enabled telemedicine application and verified that I am speaking with the correct person using two identifiers.  Patient Location: Home  Provider Location: Home Office  I discussed the limitations of evaluation and management by telemedicine. The patient expressed understanding and agreed to proceed.  Vital Signs: Because this visit was a virtual/telehealth visit, some criteria may be missing or patient reported. Any vitals not documented were not able to be obtained and vitals that have been documented are patient reported.  Persons Participating in Visit: Patient.  AWV Questionnaire: No: Patient Medicare AWV questionnaire was not completed prior to this visit.  Cardiac Risk Factors include: advanced age (>74men, >80 women)     Objective:    Today's Vitals   03/27/24 1422  Weight: 162 lb (73.5 kg)  Height: 5' 5.5 (1.664 m)   Body mass index is 26.55 kg/m.     03/27/2024    2:29 PM 03/16/2023    2:06 PM 02/13/2023   11:02 AM 03/08/2022    9:52 AM 01/27/2022    9:58 AM 01/18/2021    8:30 AM 01/28/2020   11:13 AM  Advanced Directives  Does Patient Have a Medical Advance Directive? Yes Yes Yes;No Yes Yes Yes Yes  Type of Estate agent of [place];Living will Living will;Healthcare Power of [employer] Power of [place];Living will Healthcare Power of ;Living will Healthcare Power of [place];Living will Healthcare Power of [address];Living will Healthcare Power of [place];Living will  Does patient want to make changes to  medical advance directive? No - Patient declined   No - Patient declined     Copy of Healthcare Power of Attorney in Chart? Yes - validated most recent copy scanned in chart (See row information) No - copy requested No - copy requested Yes - validated most recent copy scanned in chart (See row information)       Current Medications (verified) Outpatient Encounter Medications as of 03/27/2024  Medication Sig   Ascorbic Acid (VITAMIN C) 1000 MG tablet Take 1,000 mg by mouth daily.   atorvastatin  (LIPITOR) 10 MG tablet Take 1 tablet (10 mg total) by mouth daily.   Benfotiamine 150 MG CAPS Take 150 mg by mouth daily.   Cholecalciferol (VITAMIN D-3) 125 MCG (5000 UT) TABS Take 5,000 Units by mouth daily.   fluticasone  (FLONASE ) 50 MCG/ACT nasal spray Place 2 sprays into both nostrils daily. (Patient taking differently: Place 2 sprays into both nostrils daily as needed for allergies.)   GLUCOSAMINE PO Take 1,500 mg by mouth daily.    ipratropium (ATROVENT ) 0.06 % nasal spray Place 1-2 sprays into both nostrils 4 (four) times daily. As needed for nasal congestion   Magnesium 400 MG TABS Take 400 mg by mouth 2 (two) times daily.   Melatonin 5 MG TABS Take 5-10 mg by mouth at bedtime.   Misc Natural Products (PROSTATE SUPPORT PO) Take 3 tablets by mouth daily.   OVER THE COUNTER MEDICATION Take 280 mg by mouth daily. [place]   SAW PALMETTO, SERENOA REPENS, PO Take 800 mg by mouth daily.  traZODone  (DESYREL ) 100 MG tablet TAKE 1/2 TO 1 TABLET BY MOUTH AT BEDTIME AS NEEDED FOR SLEEP   TURMERIC PO Take 1,200 mg by mouth daily.    amoxicillin -clavulanate (AUGMENTIN ) 875-125 MG tablet Take 1 tablet by mouth 2 (two) times daily. (Patient not taking: Reported on 03/27/2024)   No facility-administered encounter medications on file as of 03/27/2024.    Allergies (verified) Patient has no known allergies.   History: Past Medical History:  Diagnosis Date   Allergy    Arthritis    Past Surgical  History:  Procedure Laterality Date   INSERTION OF MESH Bilateral 02/21/2023   Procedure: INSERTION OF MESH;  Surgeon: Polly Cordella LABOR, MD;  Location: [facility] OR;  Service: General;  Laterality: Bilateral;   TOTAL HIP ARTHROPLASTY Left 05/29/2019   Procedure: TOTAL HIP ARTHROPLASTY ANTERIOR APPROACH SAME DAY DISCHARGE;  Surgeon: Melodi Lerner, MD;  Location: [facility] ORS;  Service: Orthopedics;  Laterality: Left;    TOTAL SHOULDER ARTHROPLASTY Left 02/13/2020   Procedure: TOTAL SHOULDER ARTHROPLASTY;  Surgeon: Melita Drivers, MD;  Location: [facility] OR[facility];  Service: Orthopedics;  Laterality: Left;    Family History  Problem Relation Age of Onset   Heart disease Mother        CHF   Stroke Father    Social History   Socioeconomic History   Marital status: Married    Spouse name: Rodney Bailey   Number of children: 1   Years of education: Not on file   Highest education level: Not on file  Occupational History   Occupation: RETIRED  Tobacco Use   Smoking status: Never   Smokeless tobacco: Never  Vaping Use   Vaping status: Never Used  Substance and Sexual Activity   Alcohol use: No   Drug use: No   Sexual activity: Yes    Birth control/protection: None    Comment: SEX PARTNERS IN THE LAST 12 MONTHS 1 AND CURRENT BIRTH CONTROL - NOT NEEDED  Other Topics Concern   Not on file  Social History Narrative   EXERCISE WALKING AND WEIGHT TRAINING 5-6 DAYS/WEEK FOR 30 MINUTES   His wife died 03/30/17      Lives with wife and daughter/2025/and 1 dog   Social Drivers of Corporate investment banker Strain: Low Risk  (03/27/2024)   Overall Financial Resource Strain (CARDIA)    Difficulty of Paying Living Expenses: Not very hard  Food Insecurity: No Food Insecurity (03/27/2024)   Hunger Vital Sign    Worried About Running Out of Food in the Last Year: Never true    Ran Out of Food in the Last Year: Never true  Transportation Needs: No Transportation Needs (03/27/2024)   PRAPARE -  Administrator, Civil Service (Medical): No    Lack of Transportation (Non-Medical): No  Physical Activity: Sufficiently Active (03/27/2024)   Exercise Vital Sign    Days of Exercise per Week: 6 days    Minutes of Exercise per Session: 30 min  Stress: No Stress Concern Present (03/27/2024)   [employer] of Occupational Health - Occupational Stress Questionnaire    Feeling of Stress: Not at all  Social Connections: Socially Integrated (03/27/2024)   Social Connection and Isolation Panel    Frequency of Communication with Friends and Family: More than three times a week    Frequency of Social Gatherings with Friends and Family: Twice a week    Attends Religious Services: More than 4 times per year    Active Member of  Clubs or Organizations: Yes    Attends Engineer, structural: More than 4 times per year    Marital Status: Married    Tobacco Counseling Counseling given: Not Answered    Clinical Intake:  Pre-visit preparation completed: Yes  Pain : No/denies pain     BMI - recorded: 26.55 Nutritional Status: BMI 25 -29 Overweight Nutritional Risks: None Diabetes: No  Lab Results  Component Value Date   HGBA1C 5.6 01/18/2021   HGBA1C 5.5 05/03/2019     How often do you need to have someone help you when you read instructions, pamphlets, or other written materials from your doctor or pharmacy?: 1 - Never  Interpreter Needed?: No  Information entered by :: Rodney Bailey, RMA   Activities of Daily Living     03/27/2024    2:22 PM  In your present state of health, do you have any difficulty performing the following activities:  Hearing? 1  Comment Wears hearing aides  Vision? 0  Difficulty concentrating or making decisions? 0  Walking or climbing stairs? 0  Dressing or bathing? 0  Doing errands, shopping? 0  Preparing Food and eating ? N  Using the Toilet? N  In the past six months, have you accidently leaked urine? N  Do you have  problems with loss of bowel control? N  Managing your Medications? N  Managing your Finances? N  Housekeeping or managing your Housekeeping? N    Patient Care Team: Levora Reyes SAUNDERS, MD as PCP - General (Family Medicine) Livingston Rigg, MD as Consulting Physician (Dermatology) Melita Drivers, MD as Consulting Physician (Orthopedic Surgery)  I have updated your Care Teams any recent Medical Services you may have received from other providers in the past year.     Assessment:   This is a routine wellness examination for [employer].  Hearing/Vision screen Hearing Screening - Comments:: Wears hearing aides Vision Screening - Comments:: Wears eyeglasses/   Goals Addressed             This Visit's Progress    Weight (lb) < 200 lb (90.7 kg)   162 lb (73.5 kg)    Keeping weight and to stay mobil/2025       Depression Screen    03/27/2024    2:31 PM 03/18/2024    9:41 AM 08/30/2023    9:26 AM 03/16/2023    2:08 PM 03/01/2023    9:27 AM 01/11/2023    2:11 PM 09/09/2022    9:47 AM  PHQ 2/9 Scores  PHQ - 2 Score 0 0 0 0 1 0 0  PHQ- 9 Score 0 0 1 2 1 1      Fall Risk     03/27/2024    2:29 PM 03/18/2024    9:42 AM 08/30/2023    9:26 AM 03/16/2023    2:03 PM 03/01/2023    9:27 AM  Fall Risk   Falls in the past year? 0 0 0 0 0  Number falls in past yr:  0 0 0 0  Injury with Fall?  0 0 0 0  Risk for fall due to :  No Fall Risks No Fall Risks    Follow up Falls evaluation completed;Falls prevention discussed Falls evaluation completed Falls evaluation completed Falls evaluation completed;Education provided;Falls prevention discussed Falls evaluation completed    MEDICARE RISK AT HOME:  Medicare Risk at Home Any stairs in or around the home?: No If so, are there any without handrails?: No Home free of loose throw  rugs in walkways, pet beds, electrical cords, etc?: Yes Adequate lighting in your home to reduce risk of falls?: Yes Life alert?: No Use of a cane, walker or w/c?:  No Grab bars in the bathroom?: No Shower chair or bench in shower?: No Elevated toilet seat or a handicapped toilet?: No  TIMED UP AND GO:  Was the test performed?  No  Cognitive Function: Declined/Normal: No cognitive concerns noted by patient or family. Patient alert, oriented, able to answer questions appropriately and recall recent events. No signs of memory loss or confusion.      01/11/2024   10:30 AM  Montreal Cognitive Assessment   Visuospatial/ Executive (0/5) 4  Naming (0/3) 3  Attention: Read list of digits (0/2) 2  Attention: Read list of letters (0/1) 1  Attention: Serial 7 subtraction starting at 100 (0/3) 3  Language: Repeat phrase (0/2) 1  Language : Fluency (0/1) 0  Abstraction (0/2) 2  Delayed Recall (0/5) 0  Orientation (0/6) 6  Total 22  Adjusted Score (based on education) 22      03/16/2023    2:09 PM 03/08/2022    9:52 AM 01/27/2022    9:27 AM 01/18/2021    8:40 AM 01/18/2021    8:26 AM  6CIT Screen  What Year? 0 points 0 points 0 points 0 points 0 points  What month? 0 points 0 points 0 points 0 points 0 points  What time? 0 points 0 points 0 points 0 points 0 points  Count back from 20 2 points 0 points 0 points 0 points 0 points  Months in reverse 0 points 0 points 0 points 0 points 0 points  Repeat phrase 4 points 0 points 0 points 2 points 4 points  Total Score 6 points 0 points 0 points 2 points 4 points    Immunizations Immunization History  Administered Date(s) Administered   Fluad Quad(high Dose 65+) 01/19/2019   INFLUENZA, HIGH DOSE SEASONAL PF 02/27/2018, 01/29/2023   Influenza,inj,Quad PF,6+ Mos 03/28/2017   Influenza,inj,quad, With Preservative 03/07/2019   Influenza-Unspecified 02/27/2018   PFIZER(Purple Top)SARS-COV-2 Vaccination 07/15/2019, 08/09/2019, 03/27/2020, 01/29/2023   Pneumococcal Conjugate-13 03/10/2017   Pneumococcal Polysaccharide-23 11/11/2019   Tdap 03/19/2013, 08/30/2023   Zoster Recombinant(Shingrix ) 01/19/2019,  04/08/2019    Screening Tests Health Maintenance  Topic Date Due   Influenza Vaccine  01/05/2024   COVID-19 Vaccine (5 - 2025-26 season) 02/05/2024   Medicare Annual Wellness (AWV)  03/15/2024   Colonoscopy  06/03/2031   DTaP/Tdap/Td (3 - Td or Tdap) 08/29/2033   Pneumococcal Vaccine: 50+ Years  Completed   Hepatitis C Screening  Completed   Zoster Vaccines- Shingrix   Completed   Meningococcal B Vaccine  Aged Out    Health Maintenance Items Addressed: See Nurse Notes at the end of this note  Additional Screening:  Vision Screening: Recommended annual ophthalmology exams for early detection of glaucoma and other disorders of the eye. Is the patient up to date with their annual eye exam?  Yes  Who is the provider or what is the name of the office in which the patient attends annual eye exams? Wears eyeglasses/Dr. Miller/patient is up to date  Dental Screening: Recommended annual dental exams for proper oral hygiene  Community Resource Referral / Chronic Care Management: CRR required this visit?  No   CCM required this visit?  No   Plan:    I have personally reviewed and noted the following in the patient's chart:   Medical and social history Use  of alcohol, tobacco or illicit drugs  Current medications and supplements including opioid prescriptions. Patient is not currently taking opioid prescriptions. Functional ability and status Nutritional status Physical activity Advanced directives List of other physicians Hospitalizations, surgeries, and ER visits in previous 12 months Vitals Screenings to include cognitive, depression, and falls Referrals and appointments  In addition, I have reviewed and discussed with patient certain preventive protocols, quality metrics, and best practice recommendations. A written personalized care plan for preventive services as well as general preventive health recommendations were provided to patient.   Rodney Bailey,  CMA   03/27/2024   After Visit Summary: (MyChart) Due to this being a telephonic visit, the after visit summary with patients personalized plan was offered to patient via MyChart   Notes: Patient is up to date on all health maintenance with no concerns to address today.

## 2024-03-27 NOTE — Patient Instructions (Signed)
 Rodney Bailey,  Thank you for taking the time for your Medicare Wellness Visit. I appreciate your continued commitment to your health goals. Please review the care plan we discussed, and feel free to reach out if I can assist you further.  Medicare recommends these wellness visits once per year to help you and your care team stay ahead of potential health issues. These visits are designed to focus on prevention, allowing your provider to concentrate on managing your acute and chronic conditions during your regular appointments.  Please note that Annual Wellness Visits do not include a physical exam. Some assessments may be limited, especially if the visit was conducted virtually. If needed, we may recommend a separate in-person follow-up with your provider.  Ongoing Care Seeing your primary care provider every 3 to 6 months helps us  monitor your health and provide consistent, personalized care. Last office visit on 03/18/2024. Keep up the good work.  Referrals If a referral was made during today's visit and you haven't received any updates within two weeks, please contact the referred provider directly to check on the status.  Recommended Screenings:  Health Maintenance  Topic Date Due   Flu Shot  01/05/2024   COVID-19 Vaccine (5 - 2025-26 season) 02/05/2024   Medicare Annual Wellness Visit  03/15/2024   Colon Cancer Screening  06/03/2031   DTaP/Tdap/Td vaccine (3 - Td or Tdap) 08/29/2033   Pneumococcal Vaccine for age over 77  Completed   Hepatitis C Screening  Completed   Zoster (Shingles) Vaccine  Completed   Meningitis B Vaccine  Aged Out       03/27/2024    2:29 PM  Advanced Directives  Does Patient Have a Medical Advance Directive? Yes  Type of Estate agent of [place];Living will  Does patient want to make changes to medical advance directive? No - Patient declined  Copy of Healthcare Power of Attorney in Chart? Yes - validated most recent copy scanned in  chart (See row information)   Advance Care Planning is important because it: Ensures you receive medical care that aligns with your values, goals, and preferences. Provides guidance to your family and loved ones, reducing the emotional burden of decision-making during critical moments.  Vision: Annual vision screenings are recommended for early detection of glaucoma, cataracts, and diabetic retinopathy. These exams can also reveal signs of chronic conditions such as diabetes and high blood pressure.  Dental: Annual dental screenings help detect early signs of oral cancer, gum disease, and other conditions linked to overall health, including heart disease and diabetes.  Please see the attached documents for additional preventive care recommendations.

## 2024-03-28 ENCOUNTER — Other Ambulatory Visit

## 2024-03-28 ENCOUNTER — Encounter: Payer: Self-pay | Admitting: Physician Assistant

## 2024-03-28 ENCOUNTER — Ambulatory Visit

## 2024-03-28 ENCOUNTER — Ambulatory Visit: Admitting: Physician Assistant

## 2024-03-28 VITALS — BP 115/73 | HR 78 | Resp 18 | Ht 65.0 in | Wt 166.0 lb

## 2024-03-28 DIAGNOSIS — R413 Other amnesia: Secondary | ICD-10-CM | POA: Diagnosis not present

## 2024-03-28 LAB — TSH: TSH: 1.02 m[IU]/L (ref 0.40–4.50)

## 2024-03-28 LAB — VITAMIN B12: Vitamin B-12: 537 pg/mL (ref 200–1100)

## 2024-03-28 NOTE — Patient Instructions (Addendum)
 It was a pleasure to see you today at our office.   Recommendations:  Neurocognitive evaluation at our office   MRI of the brain, the radiology office will call you to arrange you appointment  [phone] Check labs today [address]  Follow up after the neurocognitive testing  Recommend follow up on [facility]    [email or web address]   RECOMMENDATIONS FOR ALL PATIENTS WITH MEMORY PROBLEMS: 1. Continue to exercise (Recommend 30 minutes of walking everyday, or 3 hours every week) 2. Increase social interactions - continue going to [place] and enjoy social gatherings with friends and family 3. Eat healthy, avoid fried foods and eat more fruits and vegetables 4. Maintain adequate blood pressure, blood sugar, and blood cholesterol level. Reducing the risk of stroke and cardiovascular disease also helps promoting better memory. 5. Avoid stressful situations. Live a simple life and avoid aggravations. Organize your time and prepare for the next day in anticipation. 6. Sleep well, avoid any interruptions of sleep and avoid any distractions in the bedroom that may interfere with adequate sleep quality 7. Avoid sugar, avoid sweets as there is a strong link between excessive sugar intake, diabetes, and cognitive impairment We discussed the Mediterranean diet, which has been shown to help patients reduce the risk of progressive memory disorders and reduces cardiovascular risk. This includes eating fish, eat fruits and green leafy vegetables, nuts like almonds and hazelnuts, walnuts, and also use olive oil. Avoid fast foods and fried foods as much as possible. Avoid sweets and sugar as sugar use has been linked to worsening of memory function.  There is always a concern of gradual progression of memory problems. If this is the case, then we may need to adjust level of care according to patient needs. Support, both to the patient and caregiver, should then be  put into place.      You have been referred for a neuropsychological evaluation (i.e., evaluation of memory and thinking abilities). Please bring someone with you to this appointment if possible, as it is helpful for the doctor to hear from both you and another adult who knows you well. Please bring eyeglasses and hearing aids if you wear them.    The evaluation will take approximately 3 hours and has two parts:   The first part is a clinical interview with the neuropsychologist (Dr. Richie or Dr. Gayland). During the interview, the neuropsychologist will speak with you and the individual you brought to the appointment.    The second part of the evaluation is testing with the doctor's technician Neal or Luke). During the testing, the technician will ask you to remember different types of material, solve problems, and answer some questionnaires. Your family member will not be present for this portion of the evaluation.   Please note: We must reserve several hours of the neuropsychologist's time and the psychometrician's time for your evaluation appointment. As such, there is a No-Show fee of $100. If you are unable to attend any of your appointments, please contact our office as soon as possible to reschedule.      DRIVING: Regarding driving, in patients with progressive memory problems, driving will be impaired. We advise to have someone else do the driving if trouble finding directions or if minor accidents are reported. Independent driving assessment is available to determine safety of driving.   If you are interested in the driving assessment, you can contact the following:  [facility] in [place] [phone]  [facility] [phone]  [facility]  [phone]  [facility] [phone] or [phone]   FALL PRECAUTIONS: Be cautious when walking. Scan the area for obstacles that may increase the risk of trips and falls. When getting  up in the mornings, sit up at the edge of the bed for a few minutes before getting out of bed. Consider elevating the bed at the head end to avoid drop of blood pressure when getting up. Walk always in a well-lit room (use night lights in the walls). Avoid area rugs or power cords from appliances in the middle of the walkways. Use a walker or a cane if necessary and consider physical therapy for balance exercise. Get your eyesight checked regularly.  FINANCIAL OVERSIGHT: Supervision, especially oversight when making financial decisions or transactions is also recommended.  HOME SAFETY: Consider the safety of the kitchen when operating appliances like stoves, microwave oven, and blender. Consider having supervision and share cooking responsibilities until no longer able to participate in those. Accidents with firearms and other hazards in the house should be identified and addressed as well.   ABILITY TO BE LEFT ALONE: If patient is unable to contact 911 operator, consider using LifeLine, or when the need is there, arrange for someone to stay with patients. Smoking is a fire hazard, consider supervision or cessation. Risk of wandering should be assessed by caregiver and if detected at any point, supervision and safe proof recommendations should be instituted.  MEDICATION SUPERVISION: Inability to self-administer medication needs to be constantly addressed. Implement a mechanism to ensure safe administration of the medications.      Mediterranean Diet A Mediterranean diet refers to food and lifestyle choices that are based on the traditions of countries located on the [employer]. This way of eating has been shown to help prevent certain conditions and improve outcomes for people who have chronic diseases, like kidney disease and heart disease. What are tips for following this plan? Lifestyle  Cook and eat meals together with your family, when possible. Drink enough fluid to keep your urine  clear or pale yellow. Be physically active every day. This includes: Aerobic exercise like running or swimming. Leisure activities like gardening, walking, or housework. Get 7-8 hours of sleep each night. If recommended by your health care provider, drink red wine in moderation. This means 1 glass a day for nonpregnant women and 2 glasses a day for men. A glass of wine equals 5 oz (150 mL). Reading food labels  Check the serving size of packaged foods. For foods such as rice and pasta, the serving size refers to the amount of cooked product, not dry. Check the total fat in packaged foods. Avoid foods that have saturated fat or trans fats. Check the ingredients list for added sugars, such as corn syrup. Shopping  At the grocery store, buy most of your food from the areas near the walls of the store. This includes: Fresh fruits and vegetables (produce). Grains, beans, nuts, and seeds. Some of these may be available in unpackaged forms or large amounts (in bulk). Fresh seafood. Poultry and eggs. Low-fat dairy products. Buy whole ingredients instead of prepackaged foods. Buy fresh fruits and vegetables in-season from local farmers markets. Buy frozen fruits and vegetables in resealable bags. If you do not have access to quality fresh seafood, buy precooked frozen shrimp or canned fish, such as tuna, salmon, or sardines. Buy small amounts of raw or cooked vegetables, salads, or olives from the deli or salad bar at your store. Stock your pantry so you always  have certain foods on hand, such as olive oil, canned tuna, canned tomatoes, rice, pasta, and beans. Cooking  Cook foods with extra-virgin olive oil instead of using butter or other vegetable oils. Have meat as a side dish, and have vegetables or grains as your main dish. This means having meat in small portions or adding small amounts of meat to foods like pasta or stew. Use beans or vegetables instead of meat in common dishes like chili or  lasagna. Experiment with different cooking methods. Try roasting or broiling vegetables instead of steaming or sauteing them. Add frozen vegetables to soups, stews, pasta, or rice. Add nuts or seeds for added healthy fat at each meal. You can add these to yogurt, salads, or vegetable dishes. Marinate fish or vegetables using olive oil, lemon juice, garlic, and fresh herbs. Meal planning  Plan to eat 1 vegetarian meal one day each week. Try to work up to 2 vegetarian meals, if possible. Eat seafood 2 or more times a week. Have healthy snacks readily available, such as: Vegetable sticks with hummus. Greek yogurt. Fruit and nut trail mix. Eat balanced meals throughout the week. This includes: Fruit: 2-3 servings a day Vegetables: 4-5 servings a day Low-fat dairy: 2 servings a day Fish, poultry, or lean meat: 1 serving a day Beans and legumes: 2 or more servings a week Nuts and seeds: 1-2 servings a day Whole grains: 6-8 servings a day Extra-virgin olive oil: 3-4 servings a day Limit red meat and sweets to only a few servings a month What are my food choices? Mediterranean diet Recommended Grains: Whole-grain pasta. Brown rice. Bulgar wheat. Polenta. Couscous. Whole-wheat bread. [place] Made[place]. Vegetables: Artichokes. Beets. Broccoli. Cabbage. Carrots. Eggplant. Green beans. Chard. Kale. Spinach. Onions. Leeks. Peas. Squash. Tomatoes. Peppers. Radishes. Fruits: Apples. Apricots. Avocado. Berries. Bananas. Cherries. Dates. Figs. Grapes. Lemons. Melon. Oranges. Peaches. Plums. Pomegranate. Meats and other protein foods: Beans. Almonds. Sunflower seeds. Pine nuts. Peanuts. Cod. Salmon. Scallops. Shrimp. Tuna. Tilapia. Clams. Oysters. Eggs. Dairy: Low-fat milk. Cheese. Greek yogurt. Beverages: Water . Red wine. Herbal tea. Fats and oils: Extra virgin olive oil. Avocado oil. Grape seed oil. Sweets and desserts: [place] yogurt with honey. Baked apples. Poached pears. Trail mix. Seasoning and  other foods: Basil. Cilantro. Coriander. Cumin. Mint. Parsley. Sage. Rosemary. Tarragon. Garlic. Oregano. Thyme. Pepper. Balsalmic vinegar. Tahini. Hummus. Tomato sauce. Olives. Mushrooms. Limit these Grains: Prepackaged pasta or rice dishes. Prepackaged cereal with added sugar. Vegetables: Deep fried potatoes (french fries). Fruits: Fruit canned in syrup. Meats and other protein foods: Beef. Pork. Lamb. Poultry with skin. Hot dogs. [place]. Dairy: Ice cream. Sour cream. Whole milk. Beverages: Juice. Sugar-sweetened soft drinks. Beer. Liquor and spirits. Fats and oils: Butter. Canola oil. Vegetable oil. Beef fat (tallow). Lard. Sweets and desserts: Cookies. Cakes. Pies. Candy. Seasoning and other foods: Mayonnaise. Premade sauces and marinades. The items listed may not be a complete list. Talk with your dietitian about what dietary choices are right for you. Summary The Mediterranean diet includes both food and lifestyle choices. Eat a variety of fresh fruits and vegetables, beans, nuts, seeds, and whole grains. Limit the amount of red meat and sweets that you eat. Talk with your health care provider about whether it is safe for you to drink red wine in moderation. This means 1 glass a day for nonpregnant women and 2 glasses a day for men. A glass of wine equals 5 oz (150 mL). This information is not intended to replace advice given to you by your health  care provider. Make sure you discuss any questions you have with your health care provider. Document Released: 01/14/2016 Document Revised: 02/16/2016 Document Reviewed: 01/14/2016 Elsevier Interactive Patient Education  2017 [employer].

## 2024-03-29 ENCOUNTER — Ambulatory Visit: Payer: Self-pay | Admitting: Physician Assistant

## 2024-03-31 ENCOUNTER — Ambulatory Visit
Admission: RE | Admit: 2024-03-31 | Discharge: 2024-03-31 | Disposition: A | Source: Ambulatory Visit | Attending: Physician Assistant | Admitting: Physician Assistant

## 2024-03-31 DIAGNOSIS — R413 Other amnesia: Secondary | ICD-10-CM | POA: Diagnosis not present

## 2024-04-03 ENCOUNTER — Ambulatory Visit: Payer: Self-pay | Admitting: Psychology

## 2024-04-03 ENCOUNTER — Ambulatory Visit: Admitting: Psychology

## 2024-04-03 DIAGNOSIS — G3184 Mild cognitive impairment, so stated: Secondary | ICD-10-CM | POA: Diagnosis not present

## 2024-04-03 DIAGNOSIS — F0670 Mild neurocognitive disorder due to known physiological condition without behavioral disturbance: Secondary | ICD-10-CM

## 2024-04-03 DIAGNOSIS — R4189 Other symptoms and signs involving cognitive functions and awareness: Secondary | ICD-10-CM

## 2024-04-03 NOTE — Progress Notes (Signed)
   Psychometrician Note   Cognitive testing was administered to Rodney D Bonsignore Jr. by Lonell Jude, B.S. (psychometrist) under the supervision of Dr. Renda Beckwith, Psy.D., licensed psychologist on 04/03/2024. Rodney Bailey did not appear overtly distressed by the testing session per behavioral observation or responses across self-report questionnaires. Rest breaks were offered.   The battery of tests administered was selected by Dr. Renda Beckwith, Psy.D. with consideration to Rodney Bailey current level of functioning, the nature of his symptoms, emotional and behavioral responses during interview, level of literacy, observed level of motivation/effort, and the nature of the referral question. This battery was communicated to the psychometrist. Communication between Dr. Renda Beckwith, Psy.D. and the psychometrist was ongoing throughout the evaluation and Dr. Renda Beckwith, Psy.D. was immediately accessible at all times. Dr. Renda Beckwith, Psy.D. provided supervision to the psychometrist on the date of this service to the extent necessary to assure the quality of all services provided.    Rodney Champoux Enzo Jr. will return within approximately 1-2 weeks for an interactive feedback session with Dr. Beckwith at which time his test performances, clinical impressions, and treatment recommendations will be reviewed in detail. Rodney Bailey he can contact our office should he require our assistance before this time.  A total of 120 minutes of billable time were spent face-to-face with Rodney Bailey by the psychometrist. This includes both test administration and scoring time. Billing for these services is reflected in the clinical report generated by Dr. Renda Beckwith, Psy.D.  This note reflects time spent with the psychometrician and does not include test scores or any clinical interpretations made by Dr. Beckwith. The full report will follow in a separate note.
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
## 2024-04-10 ENCOUNTER — Ambulatory Visit: Admitting: Psychology

## 2024-04-10 DIAGNOSIS — G3184 Mild cognitive impairment, so stated: Secondary | ICD-10-CM

## 2024-04-10 DIAGNOSIS — F0670 Mild neurocognitive disorder due to known physiological condition without behavioral disturbance: Secondary | ICD-10-CM

## 2024-04-10 NOTE — Progress Notes (Signed)
   NEUROPSYCHOLOGY FEEDBACK SESSION [place]. [facility]  [place] Department of Neurology  Date of Feedback Session: 04/10/2024  REASON FOR REFERRAL   Rodney Bailey is a 73 year old, right-handed, White male with 16 years of formal education. He was referred for neuropsychological evaluation by Camie Sevin, PA-C, to assess current neurocognitive functioning, document potential cognitive deficits, and assist with treatment planning. This is his first neuropsychological evaluation.  FEEDBACK   Patient completed a comprehensive neuropsychological evaluation on 04/03/2024. Please refer to that encounter for the full report and recommendations. Briefly, results indicated primary difficulties in learning/memory, verbal fluency, and aspects of executive functioning. In the setting of preserved functional independence, findings support a diagnosis of mild neurocognitive disorder (mild cognitive impairment). The overall clinical picture is concerning for a mixed etiology (i.e., vascular and neurodegenerative). Close longitudinal monitoring is recommended to track progression and guide further evaluation and management.  Today, the patient was accompanied by his wife. They were provided verbal feedback regarding the findings and impression during this visit, and their questions were answered. A copy of the report was provided at the conclusion of the visit.  DISPOSITION   Patient will follow up with the referring provider, Ms. Wertman. He should return for repeat neuropsychological testing in 12-18 months to monitor his course and assist with diagnosis and treatment planning.  SERVICE   This feedback session was conducted by Renda Beckwith, Psy.D. One unit of 03867 (45 minutes) was billed for Dr. Beckwith' time spent in preparing, conducting, and documenting the current feedback session.  This report was generated using voice recognition software. While this document has been carefully  reviewed, transcription errors may be present. I apologize in advance for any inconvenience. Please contact me if further clarification is needed.

## 2024-05-26 NOTE — Progress Notes (Signed)
 "   Mild Cognitive Impairment of unclear etiology, concerning for AD    Rodney Bailey. is a very pleasant 73 y.o. RH male with a history ofarthritis presenting today in follow-up after neuropsych evaluation diagnosed patient with Mild Cognitive Impairment of unclear etiology, but with concern for AD. This patient is accompanied in the office by his wife ***  who supplements the history. Previous records as well as any outside records available were reviewed prior to todays visit.   Patient was last seen on 03/28/24 ***.  Patient is able to participate on ADLs and to to drive without difficulties. Mood is good. Discussed further studies in view of unremarkable brain MRI. Recommend LP to rule out AD, patient agrees to proceed.  ***   MRI brain without contrast to assess for underlying structural abnormality and assess vascular load  Repeat Neurocognitive testing  for diagnostic clarity and disease progression Start Donepezil 10 mg :Take half tablet (5 mg) daily for 2 weeks, then increase to the full tablet at 10 mg daily. Side effects discussed   Continue to control mood as per PCP Recommend good control of cardiovascular risk factors [place]ow up in  months***     Discussed the use of AI scribe software for clinical note transcription with the patient, who gave verbal consent to proceed.  History of Present Illness  Initial visit 03/28/24    How long did patient have memory difficulties? Wife noticed the changes first, about 5 years ago. For the last year patient noticed some changes as well.  Reports some difficulty remembering new information, conversations and names.  Long-term memory is good. repeats oneself?  Endorsed especially with appointments. Disoriented when walking into a room?  Patient denies except occasionally not remembering what patient came to the room for    Leaving objects in unusual places? Denies.   Wandering behavior?  denies .  Any personality changes?  Denies. He  is always sweet.  Any history of depression?:  Denies   Hallucinations or paranoia?  Denies   Seizures?  Denies    Any sleep changes?   Sleeps well with trazodone  and melatonin. Denies vivid dreams, REM behavior or sleepwalking   Sleep apnea? Endorsed, not on CPAP.  Any hygiene concerns?  Denies   Independent of bathing and dressing?  Endorsed  Does the patient needs help with medications? Patient is in charge   Who is in charge of the finances?Wife is in charge, but he takes care of investments.   Any changes in appetite?  Denies,      Patient have trouble swallowing? Denies.   Does the patient cook? No    Any kitchen accidents such as leaving the stove on? Denies.   Any history of headaches?   Denies.   Chronic pain ? Some joint pains occasionally.  Ambulates with difficulty?  Denies.  Uses stationary bike 6 days a week Recent falls or head injuries? Denies.   Vision changes? Denies.   Any stroke like symptoms? Denies.   Any tremors?   Denies.   Any anosmia?  Denies.   Any incontinence of urine? Nocturia   Any bowel dysfunction? Denies.      Patient lives with his wife and his daughter    History of heavy alcohol intake? Denies.   History of heavy tobacco use? Denies.   Family history of dementia Denies  Does patient drive? Yes   MRI brain without contrast 10.31.25 personally reviewed, no acute intracranial abnormalities; age related atrophy  and moderate periventricular and deep cerebral white matter disease    Past Medical History:  Diagnosis Date   Allergy    Arthritis      Past Surgical History:  Procedure Laterality Date   INSERTION OF MESH Bilateral 02/21/2023   Procedure: INSERTION OF MESH;  Surgeon: Polly Cordella LABOR, MD;  Location: [facility] OR;  Service: General;  Laterality: Bilateral;   TOTAL HIP ARTHROPLASTY Left 05/29/2019   Procedure: TOTAL HIP ARTHROPLASTY ANTERIOR APPROACH SAME DAY DISCHARGE;  Surgeon: Melodi Lerner, MD;  Location: [facility] ORS;  Service:  Orthopedics;  Laterality: Left;    TOTAL SHOULDER ARTHROPLASTY Left 02/13/2020   Procedure: TOTAL SHOULDER ARTHROPLASTY;  Surgeon: Melita Drivers, MD;  Location: [facility] ORS;  Service: Orthopedics;  Laterality: Left;             03/28/2024    5:00 PM 01/11/2024   10:30 AM  Montreal Cognitive Assessment   Visuospatial/ Executive (0/5) 2 4  Naming (0/3) 3 3  Attention: Read list of digits (0/2) 2 2  Attention: Read list of letters (0/1) 1 1  Attention: Serial 7 subtraction starting at 100 (0/3) 3 3  Language: Repeat phrase (0/2) 2 1  Language : Fluency (0/1) 1 0  Abstraction (0/2) 2 2  Delayed Recall (0/5) 1 0  Orientation (0/6) 6 6  Total 23 22  Adjusted Score (based on education) 23 22        No data to display           Cc:  Levora Reyes SAUNDERS, MD  Camie Sevin 05/26/2024 2:19 PM      "

## 2024-05-27 ENCOUNTER — Ambulatory Visit: Admitting: Physician Assistant

## 2024-05-27 ENCOUNTER — Encounter: Payer: Self-pay | Admitting: Physician Assistant

## 2024-05-27 VITALS — BP 122/71 | HR 73 | Ht 66.0 in | Wt 167.0 lb

## 2024-05-27 DIAGNOSIS — R413 Other amnesia: Secondary | ICD-10-CM

## 2024-05-27 NOTE — Patient Instructions (Signed)
 Lumbar spinal tap at [facility] [phone]

## 2024-06-03 ENCOUNTER — Ambulatory Visit: Payer: Self-pay | Admitting: *Deleted

## 2024-06-03 NOTE — Telephone Encounter (Signed)
 FYI - patient has appt tomorrow with Dr. Jerrell

## 2024-06-03 NOTE — Telephone Encounter (Signed)
 FYI Only or Action Required?: FYI only for provider: appointment scheduled on 06/04/24.  Patient was last seen in primary care on 03/18/2024 by Levora Reyes SAUNDERS, MD.  Called Nurse Triage reporting Cough. SOB with coughing   Symptoms began several days ago. 05/30/24  Interventions attempted: Rest, hydration, or home remedies.  Symptoms are: gradually worsening.  Triage Disposition: See HCP Within 4 Hours (Or PCP Triage) 4-24 hours   Patient/caregiver understands and will follow disposition?: Yes        Copied from CRM #[id number]. Topic: Clinical - Red Word Triage >> Jun 03, 2024  8:34 AM Mesmerise C wrote: [facility] that prompted transfer to Nurse Triage: Patient has been experiencing hacking, hacking, difficulty breathing, drainage been ongoing since Thursday Reason for Disposition  [1] MILD difficulty breathing (e.g., minimal/no SOB at rest, SOB with walking, pulse < 100) AND [2] still present when not coughing  Answer Assessment - Initial Assessment Questions Appt scheduled for tomorrow. Earliest available. Non with PCP until Wed. Patient reports SOB as mild but noted with coughing. Recommended if sx worsen go to UC.      1. ONSET: When did the cough begin?      05/30/24 2. SEVERITY: How bad is the cough today?      moderate 3. SPUTUM: Describe the color of your sputum (e.g., none, dry cough; clear, white, yellow, green)     Dry hacking cough  4. HEMOPTYSIS: Are you coughing up any blood? If Yes, ask: How much? (e.g., flecks, streaks, tablespoons, etc.)     Na  5. DIFFICULTY BREATHING: Are you having difficulty breathing? If Yes, ask: How bad is it? (e.g., mild, moderate, severe)      SOB  with coughing  mild. 6. FEVER: Do you have a fever? If Yes, ask: What is your temperature, how was it measured, and when did it start?     na 7. CARDIAC HISTORY: Do you have any history of heart disease? (e.g., heart attack, congestive heart failure)      Na  8.  LUNG HISTORY: Do you have any history of lung disease?  (e.g., pulmonary embolus, asthma, emphysema)     na 9. PE RISK FACTORS: Do you have a history of blood clots? (or: recent major surgery, recent prolonged travel, bedridden)     na 10. OTHER SYMPTOMS: Do you have any other symptoms? (e.g., runny nose, wheezing, chest pain)       Dry cough , SOB with coughing  sinus pain, top of nose to eyebrows. Minimal drainage no chest pain no difficulty breathing no fever today . 11. PREGNANCY: Is there any chance you are pregnant? When was your last menstrual period?       na 12. TRAVEL: Have you traveled out of the country in the last month? (e.g., travel history, exposures)       na  Protocols used: Cough - Acute Non-Productive-A-AH

## 2024-06-04 ENCOUNTER — Ambulatory Visit: Admitting: Student in an Organized Health Care Education/Training Program

## 2024-06-04 ENCOUNTER — Encounter: Payer: Self-pay | Admitting: Student in an Organized Health Care Education/Training Program

## 2024-06-04 VITALS — BP 119/64 | HR 64 | Ht 66.0 in | Wt 168.2 lb

## 2024-06-04 DIAGNOSIS — J069 Acute upper respiratory infection, unspecified: Secondary | ICD-10-CM | POA: Diagnosis not present

## 2024-06-04 NOTE — Patient Instructions (Signed)
" °  VISIT SUMMARY: You came in today with symptoms that suggest a sinus infection, which started about five days ago. Your main symptom was discomfort above your right eyebrow, which has been improving. You also had mild coughing with yellow mucus but no significant nasal drainage, ear pain, or shortness of breath. You have been using nasal sprays like Flonase  and Nasonex  and recently started taking Aricept.  YOUR PLAN: -ACUTE VIRAL UPPER RESPIRATORY INFECTION: You have a viral infection affecting your upper respiratory system, which includes your nose and throat. This type of infection usually resolves on its own without antibiotics. Continue using Nasonex  nasal spray once daily and take ibuprofen  if you experience sinus tenderness. Keep an eye on your symptoms and contact us  if they worsen or if you develop new symptoms.  INSTRUCTIONS: Please continue to monitor your symptoms and reach out if they worsen or if you develop new symptoms. Continue using Nasonex  nasal spray once daily and take ibuprofen  as needed for sinus tenderness.    "

## 2024-06-04 NOTE — Assessment & Plan Note (Signed)
 Symptoms indicate a resolving viral infection with no signs of bacterial sinusitis.  His symptoms are very mild at this point and I think he has a resolving viral upper respiratory tract infection.  We talked about supportive care.  Very likely he is going to improve on his own with more time.  Continue Nasonex  nasal spray once daily. Use ibuprofen  for sinus tenderness as needed.  We talked about the postinfectious cough being the likely last thing to resolve.  Monitor symptoms and contact if he worsens or new symptoms develop.

## 2024-06-04 NOTE — Progress Notes (Signed)
" ° °  Acute Office Visit  Patient ID: Rodney Bailey., male    DOB: 08-25-1950, 73 y.o.   MRN: [id number]  PCP: Levora Reyes SAUNDERS, MD  Chief Complaint  Patient presents with   Acute Visit    Cough. SOB and sinus pain. Sx started christmas day.     Subjective:     HPI  Discussed the use of AI scribe software for clinical note transcription with the patient, who gave verbal consent to proceed.  History of Present Illness Rodney Dougal. is a 73 year old male who presents with symptoms suggestive of a sinus infection.  Symptoms began on Christmas night, approximately five days ago, with initial fevers and chills that have since resolved. The primary symptom is discomfort above the right eyebrow, which has improved by the day of the visit. No ear pain, significant nasal drainage, or shortness of breath. Mild coughing occurs throughout the day, producing yellow mucus. Eating and drinking habits are normal, and he is able to move around the house without difficulty.  He has been using several nasal sprays, including Flonase  and Nasonex , but does not use Afrin or Atrovent . He has started taking Aricept.  He spent the holiday at the beach and reports no other family members or acquaintances being sick.      Objective:    BP 119/64 (BP Location: Right Arm, Patient Position: Sitting, Cuff Size: Normal)   Pulse 64   Ht 5' 6 (1.676 m)   Wt 168 lb 3.2 oz (76.3 kg)   SpO2 97%   BMI 27.15 kg/m   Physical Exam  Gen: Well-appearing man Ears: Moderate not impacting ceruminosis, normal tympanic membranes Face: No discomfort with palpation over the frontal or maxillary sinuses Mouth: No oral lesions, no posterior oropharynx erythema or exudate Neck: No tender cervical adenopathy Heart: Regular, no murmur Lungs: Unlabored, clear without crackles or wheezing     Assessment & Plan:   Problem List Items Addressed This Visit       Unprioritized   URTI (acute upper respiratory  infection) - Primary   Symptoms indicate a resolving viral infection with no signs of bacterial sinusitis.  His symptoms are very mild at this point and I think he has a resolving viral upper respiratory tract infection.  We talked about supportive care.  Very likely he is going to improve on his own with more time.  Continue Nasonex  nasal spray once daily. Use ibuprofen  for sinus tenderness as needed.  We talked about the postinfectious cough being the likely last thing to resolve.  Monitor symptoms and contact if he worsens or new symptoms develop.       Return if symptoms worsen or fail to improve.  Rodney Debby Specking, MD [facility]   "

## 2024-06-11 NOTE — Discharge Instructions (Signed)

## 2024-06-12 ENCOUNTER — Ambulatory Visit
Admission: RE | Admit: 2024-06-12 | Discharge: 2024-06-12 | Disposition: A | Discharge status: Home / Self Care | Source: Ambulatory Visit | Attending: Physician Assistant | Admitting: Physician Assistant

## 2024-06-12 VITALS — BP 156/76 | HR 56

## 2024-06-12 DIAGNOSIS — R413 Other amnesia: Secondary | ICD-10-CM

## 2024-06-18 LAB — MAYO MISC ORDER 2: PRICE:: 1288

## 2024-06-20 LAB — MAYO MISC ORDER: PRICE:: 1740.8

## 2024-06-21 ENCOUNTER — Encounter: Payer: Self-pay | Admitting: Physician Assistant

## 2024-06-26 ENCOUNTER — Other Ambulatory Visit: Payer: Self-pay

## 2024-06-26 DIAGNOSIS — E785 Hyperlipidemia, unspecified: Secondary | ICD-10-CM

## 2024-06-26 MED ORDER — ATORVASTATIN CALCIUM 10 MG PO TABS: 10.0000 mg | ORAL_TABLET | Freq: Every day | ORAL | 1 refills | Status: AC

## 2024-06-26 NOTE — Progress Notes (Signed)
 Received a fax requesting refill, determined this is appropriate and sent in 6 month supply

## 2024-06-27 ENCOUNTER — Other Ambulatory Visit: Payer: Self-pay

## 2024-06-27 ENCOUNTER — Other Ambulatory Visit: Payer: Self-pay | Admitting: Family Medicine

## 2024-06-27 DIAGNOSIS — G4700 Insomnia, unspecified: Secondary | ICD-10-CM

## 2024-06-27 DIAGNOSIS — R413 Other amnesia: Secondary | ICD-10-CM

## 2024-06-28 ENCOUNTER — Other Ambulatory Visit

## 2024-07-07 LAB — AD-DETECT ABETA 42/40 AND P-TAU217 EVALUATION, PLASMA
ABETA 40: 295 pg/mL
ABETA 42/40 RATIO: 0.173
ABETA 42: 51 pg/mL
AD DETECT PTAU217: 0.43 pg/mL
INTERPRETATION: UNDETERMINED
LIKELIHOOD SCORE: 0.3698 — ABNORMAL HIGH

## 2024-07-11 ENCOUNTER — Other Ambulatory Visit: Payer: Self-pay

## 2024-07-11 ENCOUNTER — Encounter: Payer: Self-pay | Admitting: Physician Assistant

## 2024-07-11 DIAGNOSIS — R413 Other amnesia: Secondary | ICD-10-CM

## 2024-07-11 NOTE — Progress Notes (Signed)
 Spoke with patient and wife about the LP results, with abnormal  pTau/ABeta 42 0.30 with may be consistent with AD, as well as likelihood in plasma score 0.3698 (indeterminate). Will proceed with amyloid PET scan to confirm the presence of amyloid in brain. All questions were answered to their satisfaction.

## 2024-07-11 NOTE — Progress Notes (Signed)
 Pet Scan prior authorization CPT [phone]. Pending. Case #:[id number]  Pet Scan prior authorization CPT [phone]. Pending. Case #:[id number]

## 2024-07-12 ENCOUNTER — Encounter: Payer: Self-pay | Admitting: Physician Assistant

## 2024-07-12 NOTE — Progress Notes (Signed)
 CPT [id number]. APPROVED. Authorization Number: [id number] Case Number: [id number] Expires:  01/07/2025

## 2024-09-19 ENCOUNTER — Ambulatory Visit: Admitting: Family Medicine

## 2024-11-26 ENCOUNTER — Ambulatory Visit: Payer: Self-pay | Admitting: Physician Assistant

## 2025-04-03 ENCOUNTER — Ambulatory Visit

## 2025-04-03 ENCOUNTER — Institutional Professional Consult (permissible substitution): Admitting: Psychology

## 2025-04-04 ENCOUNTER — Institutional Professional Consult (permissible substitution): Payer: Self-pay | Admitting: Psychology

## 2025-04-11 ENCOUNTER — Encounter: Admitting: Psychology
